# Patient Record
Sex: Female | Born: 1975 | Marital: Married | Smoking: Never smoker
Health system: Southern US, Community
[De-identification: ages and names within clinical notes are randomized; demographics above are authoritative.]

## PROBLEM LIST (undated history)

## (undated) DIAGNOSIS — C801 Malignant (primary) neoplasm, unspecified: Secondary | ICD-10-CM

## (undated) DIAGNOSIS — K529 Noninfective gastroenteritis and colitis, unspecified: Secondary | ICD-10-CM

## (undated) HISTORY — DX: Noninfective gastroenteritis and colitis, unspecified: K52.9

## (undated) HISTORY — PX: COLONOSCOPY: SHX174

## (undated) HISTORY — PX: WISDOM TOOTH EXTRACTION: SHX21

---

## 2017-07-16 ENCOUNTER — Other Ambulatory Visit: Payer: Self-pay | Admitting: Gastroenterology

## 2017-07-16 DIAGNOSIS — R1031 Right lower quadrant pain: Secondary | ICD-10-CM

## 2017-07-24 ENCOUNTER — Ambulatory Visit
Admission: RE | Admit: 2017-07-24 | Discharge: 2017-07-24 | Disposition: A | Discharge status: Home / Self Care | Payer: Self-pay | Source: Ambulatory Visit | Attending: Gastroenterology | Admitting: Gastroenterology

## 2017-07-24 DIAGNOSIS — R1031 Right lower quadrant pain: Secondary | ICD-10-CM

## 2017-07-24 MED ORDER — IOPAMIDOL (ISOVUE-300) INJECTION 61%
100.0000 mL | Freq: Once | INTRAVENOUS | Status: AC | PRN
  Administered 2017-07-24: 100 mL via INTRAVENOUS

## 2018-10-08 DIAGNOSIS — K5000 Crohn's disease of small intestine without complications: Secondary | ICD-10-CM | POA: Diagnosis not present

## 2018-10-08 DIAGNOSIS — R634 Abnormal weight loss: Secondary | ICD-10-CM | POA: Diagnosis not present

## 2018-10-27 DIAGNOSIS — K5000 Crohn's disease of small intestine without complications: Secondary | ICD-10-CM | POA: Diagnosis not present

## 2018-11-04 ENCOUNTER — Other Ambulatory Visit: Payer: Self-pay | Admitting: Gastroenterology

## 2018-11-04 DIAGNOSIS — R911 Solitary pulmonary nodule: Secondary | ICD-10-CM

## 2018-11-10 DIAGNOSIS — Z03818 Encounter for observation for suspected exposure to other biological agents ruled out: Secondary | ICD-10-CM | POA: Diagnosis not present

## 2018-11-23 DIAGNOSIS — Z03818 Encounter for observation for suspected exposure to other biological agents ruled out: Secondary | ICD-10-CM | POA: Diagnosis not present

## 2019-08-26 DIAGNOSIS — K5000 Crohn's disease of small intestine without complications: Secondary | ICD-10-CM | POA: Diagnosis not present

## 2019-10-21 DIAGNOSIS — K5000 Crohn's disease of small intestine without complications: Secondary | ICD-10-CM | POA: Diagnosis not present

## 2019-10-21 DIAGNOSIS — K635 Polyp of colon: Secondary | ICD-10-CM | POA: Diagnosis not present

## 2019-11-23 DIAGNOSIS — Z0000 Encounter for general adult medical examination without abnormal findings: Secondary | ICD-10-CM | POA: Diagnosis not present

## 2019-12-17 DIAGNOSIS — Z1231 Encounter for screening mammogram for malignant neoplasm of breast: Secondary | ICD-10-CM | POA: Diagnosis not present

## 2020-03-12 DIAGNOSIS — Z20822 Contact with and (suspected) exposure to covid-19: Secondary | ICD-10-CM | POA: Diagnosis not present

## 2021-04-02 DIAGNOSIS — D0512 Intraductal carcinoma in situ of left breast: Secondary | ICD-10-CM | POA: Diagnosis not present

## 2021-04-08 ENCOUNTER — Telehealth: Payer: Self-pay | Admitting: Radiation Oncology

## 2021-04-08 ENCOUNTER — Telehealth: Payer: Self-pay | Admitting: Hematology

## 2021-04-08 ENCOUNTER — Encounter: Payer: Self-pay | Admitting: Family Medicine

## 2021-04-08 ENCOUNTER — Other Ambulatory Visit: Payer: Self-pay | Admitting: *Deleted

## 2021-04-08 DIAGNOSIS — D0512 Intraductal carcinoma in situ of left breast: Secondary | ICD-10-CM | POA: Insufficient documentation

## 2021-04-08 NOTE — Telephone Encounter (Signed)
Scheduled appt per secure chat with RN Varney Biles. Called pt, no answer. Left msg for pt with appt date and time. Requested for pt to call back to confirm appt

## 2021-04-08 NOTE — Telephone Encounter (Signed)
Called patient to schedule consultation with Dr. Squire. No answer, LVM for return call. 

## 2021-04-08 NOTE — Telephone Encounter (Signed)
Called patient to schedule consultation with Dr. Isidore Moos. LVM with appt date & time. Asked patient to call back to confirm.

## 2021-04-08 NOTE — Progress Notes (Signed)
Radiation Oncology         [phone] ________________________________  Initial Outpatient Consultation  Name: Beth Stephens MRN: [id number]  Date: 04/09/2021  DOB: 01/07/1976  IP:[id number], No Pcp Per (Inactive)  Stark Klein, MD   REFERRING PHYSICIAN: Stark Klein, MD  DIAGNOSIS:    ICD-10-CM   1. Ductal carcinoma in situ (DCIS) of left breast  D05.12       Stage 0 (cTis (DCIS), cN0, cM0) Left Breast, Ductal carcinoma in-situ, ER+ / PR+ / Her2 not assessed, Grade 3  CHIEF COMPLAINT: Here to discuss management of left breast DCIS  HISTORY OF PRESENT ILLNESS::Beth Stephens is a 46 y.o. female who presented with breast abnormality on the following imaging: bilateral screening mammogram on the date of 01/04/21, which showed indeterminate grouped calcifications in the left breast measuring 0.3 cm. No symptoms, if any, were reported at that time.   Diagnostic left breast mammogram on 01/18/21 revealed the 0.3 cm round grouped calcifications in the left breast as suspicious.     Left breast biopsy on 04-02-21 showed ductal carcinoma in-situ.  ER status: 100% strongly positive; PR status 70% strongly positive, Her2 status not assessed; Grade intermediate, with necrosis.  She sees Dr Barry Dienes this week. She just met Dr Burr Medico and discussed antiestrogen therapy.   SAFETY ISSUES: Prior radiation? No Pacemaker/ICD? No Possible current pregnancy? No--LMP: ~04/05/2021 Is the patient on methotrexate? No  Current Complaints / other details:  Nothing else of note    Genetics consultation pending.  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  PREVIOUS RADIATION THERAPY: No  PAST MEDICAL HISTORY:  has a past medical history of Colitis.    PAST SURGICAL HISTORY:History reviewed. No pertinent surgical history.  FAMILY HISTORY: family history includes Cancer in an other family member; Heart disease in her father.  SOCIAL HISTORY:  reports that she has never  smoked. She does not have any smokeless tobacco history on file. She reports that she does not drink alcohol.  ALLERGIES: Sumatriptan  MEDICATIONS:  Current Outpatient Medications  Medication Sig Dispense Refill   sulfaDIAZINE 500 MG tablet Take 500 mg by mouth 2 (two) times daily.     No current facility-administered medications for this encounter.    REVIEW OF SYSTEMS: As above in HPI.   PHYSICAL EXAM:  vitals were not taken for this visit.   General: Alert and oriented, in no acute distress HEENT: Head is normocephalic. Extraocular movements are intact. Heart: Regular in rate and rhythm with no murmurs, rubs, or gallops. Chest: Clear to auscultation bilaterally, with no rhonchi, wheezes, or rales. Abdomen: Soft, nontender, nondistended, with no rigidity or guarding. Extremities: No cyanosis or edema. Lymphatics: see Neck Exam Skin: No concerning lesions. Musculoskeletal: symmetric strength and muscle tone throughout. Neurologic: Cranial nerves II through XII are grossly intact. No obvious focalities. Speech is fluent. Coordination is intact. Psychiatric: Judgment and insight are intact. Affect is appropriate. Breasts:  notable for post biopsy changes in left breast. No right breast masses, no masses in axillae b/l   ECOG = 0  0 - Asymptomatic (Fully active, able to carry on all predisease activities without restriction)  1 - Symptomatic but completely ambulatory (Restricted in physically strenuous activity but ambulatory and able to carry out work of a light or sedentary nature. For example, light housework, office work)  2 - Symptomatic, <50% in bed during the day (Ambulatory and capable of all self care but unable to carry out any  work activities. Up and about more than 50% of waking hours)  3 - Symptomatic, >50% in bed, but not bedbound (Capable of only limited self-care, confined to bed or chair 50% or more of waking hours)  4 - Bedbound (Completely disabled. Cannot  carry on any self-care. Totally confined to bed or chair)  5 - Death   Eustace Pen MM, Creech RH, Tormey DC, et al. [phone]). "Toxicity and response criteria of the [facility]". [place] Oncol. 5 (6): 649-55   LABORATORY DATA:  No results found for: WBC, HGB, HCT, MCV, PLT CMP  No results found for: NA, K, CL, CO2, GLUCOSE, BUN, CREATININE, CALCIUM, PROT, ALBUMIN, AST, ALT, ALKPHOS, BILITOT, GFRNONAA, GFRAA       RADIOGRAPHY:  as above    IMPRESSION/PLAN: This is a delightful 46 yo woman with ER+ DCIS, left breast   She appears to be good candidate for breast conservation. I talked to her about the option of a mastectomy and informed her that her expected overall survival would be equivalent between mastectomy and breast conservation, based upon randomized controlled data. She is enthusiastic about breast conservation. She sees Dr Barry Dienes soon.  It was a pleasure meeting the patient today. We discussed the risks, benefits, and side effects of adjuvant radiotherapy. I recommend radiotherapy to the left breast over 3-4 weeks to reduce her risk of locoregional recurrence by half.  We discussed  that I would give the patient a few weeks to heal following surgery before starting treatment planning.  We spoke about acute effects including skin irritation and fatigue as well as much less common late effects including internal organ injury or irritation. We spoke about the latest technology that is used to minimize the risk of late effects for patients undergoing radiotherapy to the breast or chest wall. No guarantees of treatment were given. The patient is enthusiastic about proceeding with treatment. I look forward to participating in the patient's care.  I will await her referral back to me for postoperative follow-up and eventual CT simulation/treatment planning.  On date of service, in total, I spent 45 minutes on this encounter. Patient was seen in person.    __________________________________________   Eppie Gibson, MD  This document serves as a record of services personally performed by Eppie Gibson, MD. It was created on her behalf by Roney Mans, a trained medical scribe. The creation of this record is based on the scribe's personal observations and the provider's statements to them. This document has been checked and approved by the attending provider.

## 2021-04-09 ENCOUNTER — Ambulatory Visit
Admission: RE | Admit: 2021-04-09 | Discharge: 2021-04-09 | Disposition: A | Discharge status: Home / Self Care | Payer: BC Managed Care – PPO | Source: Ambulatory Visit | Attending: Radiation Oncology | Admitting: Radiation Oncology

## 2021-04-09 ENCOUNTER — Other Ambulatory Visit: Payer: Self-pay

## 2021-04-09 ENCOUNTER — Encounter: Payer: Self-pay | Admitting: Hematology

## 2021-04-09 ENCOUNTER — Inpatient Hospital Stay: Payer: BC Managed Care – PPO | Attending: Hematology | Admitting: Hematology

## 2021-04-09 VITALS — BP 135/84 | HR 86 | Temp 98.0°F | Resp 17 | Wt 116.6 lb

## 2021-04-09 DIAGNOSIS — Z170 Estrogen receptor positive status [ER+]: Secondary | ICD-10-CM | POA: Insufficient documentation

## 2021-04-09 DIAGNOSIS — D0512 Intraductal carcinoma in situ of left breast: Secondary | ICD-10-CM | POA: Diagnosis not present

## 2021-04-09 DIAGNOSIS — Z803 Family history of malignant neoplasm of breast: Secondary | ICD-10-CM | POA: Diagnosis not present

## 2021-04-09 NOTE — Progress Notes (Signed)
Location of Breast Cancer:  Ductal carcinoma in situ (DCIS) of left breast  Past/Anticipated interventions by medical oncology, if any:  Had consultation with Dr. Truitt Merle earlier today PLAN:  -She will likely have lumpectomy soon -Genetic refer -I'll see her 2-3 weeks after her surgery to review her pathology result and finalize her antiestrogen therapy.  SAFETY ISSUES: Prior radiation? No Pacemaker/ICD? No Possible current pregnancy? No--LMP: ~04/05/2021 Is the patient on methotrexate? No  Current Complaints / other details:  Nothing else of note

## 2021-04-09 NOTE — Progress Notes (Signed)
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 Left breast DCIS, grade 2, ER+/PR+ -found on screening mammogram. Left diagnostic MM on 01/18/21 showed 0.3 cm calcifications. Biopsy on 04/02/21 confirmed intermediate grade DCIS. -I discussed her breast imaging and needle biopsy results with patient and her family members in great detail. -She is a candidate for breast conservation surgery. She will be seen by breast surgeon Dr. Barry Stephens on 04/12/21, who will likely recommend lumpectomy. -Given her young age, we recommend her to undergo genetic testing to ruled out inheritable breast cancer -Her DCIS will be cured by complete surgical resection. Any form of adjuvant therapy is preventive. -I reviewed her risk and treatment benefits using the Breast Cancer Nomogram from [facility]). Based on family, PMx and lifestyle she has a 17%%risk of developing future breast cancer in the next 10 years. Her risk would drop to 6-7% with RT or Antiestrogen therapy alone. With both adjuvant treatments her risk would decrease to 3%.  -Given her strongly ER and PR positivity of the tumor cells, and her pre-menopause status, I recommend antiestrogen therapy with tamoxifen, which decrease her risk of future breast cancer by ~40%.  --The potential side effects, which includes but not limited to, hot flash, skin and vaginal dryness, slightly increased risk of cardiovascular disease and cataract, small risk of thrombosis and endometrial cancer, were discussed with her in great details. Preventive strategies for thrombosis, such as being  physically active, using compression stocks, avoid cigarette smoking, etc., were reviewed with her. I also recommend her to follow-up with her gynecologist once a year, and watch for vaginal spotting or bleeding, as a clinically sign of endometrial cancer, etc. She voiced good understanding, and agrees to proceed. Will start after she completes adjuvant breast radiation. -She will likely benefit from breast radiation if she undergo lumpectomy to decrease the risk of breast cancer. -We also discussed that biopsy may have sampling limitation, we will review her surgical path, to see if she has any invasive carcinoma components. -We discussed breast cancer surveillance after she completes treatment, Including annual mammogram, breast exam every 6-12 months.   PLAN:  -She will see Dr. Barry Stephens later this week and likely have lumpectomy soon -Genetic refer (urgent)  -I'll see her 2-3 weeks after her surgery to review her pathology result and finalize her antiestrogen therapy.   -she is also interested in adjuvant radiation, but has some concerns about side effects, she will see Dr. Isidore Stephens today    Oncology History  Ductal carcinoma in situ (DCIS) of left breast  01/18/2021 Mammogram   Exam: 3D Mammogram Diagnostic Digital - L  IMPRESSION: The 0.3 cm grouped round calcifications in the left breast are suspicious.   04/02/2021 Initial Biopsy   Diagnosis 1. Breast, left, needle core biopsy, left breast calcification @ 3:00 6 cmfn  - DUCTAL CARCINOMA IN SITU WITH NECROSIS AND CALCIFICATIONS  - SEE COMMENT Microscopic Comment Immunohistochemistry was performed on the biopsy to assess for an invasive component (SMM, calponin, CD34 and p63). Myoepithelial cells are present supporting the diagnosis of ductal carcinoma in situ. Based on the biopsy, the ductal  carcinoma in situ cribriform and solid patterns, intermediate nuclear grade, and measures 0.6 cm in greatest linear extent.  1. 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 The patient was referred by [facility]. The patient presents to the clinic today alone.   She had routine screening mammography on 01/04/21 showing a possible abnormality in the left breast. She underwent left diagnostic mammography on 01/18/21 showing: 0.3 cm grouped calcifications in left breast at 3 o'clock.  Biopsy on 04/02/21 showed: DCIS, intermediate grade, with necrosis and calficiations. Prognostic indicators significant for: estrogen receptor, 100% positive and progesterone receptor, 70% positive.    Today the patient notes they felt/feeling prior/after...   She has a PMHx of....   Socially... 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 -she is married with a 64 year old son. -she reports lung cancer in a paternal uncle (non-smoker), no other family history of cancer to her knowledge.   GYN HISTORY  Menarchal: xx LMP: irregular Contraceptive: HRT: n/a GP: 1   REVIEW OF SYSTEMS:    Constitutional: Denies fevers, chills or abnormal night sweats Eyes: Denies blurriness of vision, double vision or watery eyes Ears, nose, mouth, throat, and face: Denies mucositis or sore throat Respiratory: Denies cough, dyspnea or wheezes Cardiovascular: Denies palpitation, chest discomfort or lower extremity swelling Gastrointestinal:  Denies nausea, heartburn  or change in bowel habits Skin: Denies abnormal skin rashes Lymphatics: Denies new lymphadenopathy or easy bruising Neurological:Denies numbness, tingling or new weaknesses Behavioral/Psych: Mood is stable, no new changes  All other systems were reviewed with the patient and are negative.   MEDICAL HISTORY:  Past Medical History:  Diagnosis Date   Colitis     SURGICAL HISTORY: History reviewed. No pertinent surgical history.  SOCIAL HISTORY: Social History   Socioeconomic History   Marital status: Married    Spouse name: Not on file   Number of children: 1   Years of education: Not on file   Highest education level: Not on file  Occupational History   Not on file  Tobacco Use   Smoking status: Never   Smokeless tobacco: Not on file  Substance and Sexual Activity   Alcohol use: Never   Drug use: Not on file   Sexual activity: Not on file  Other Topics Concern   Not on file  Social History Narrative   Not on file   Social Determinants of Health   Financial Resource Strain: Not on file  Food Insecurity: Not on file  Transportation Needs: Not on file  Physical Activity: Not on file  Stress: Not on file  Social Connections: Not on file  Intimate Partner Violence: Not on file    FAMILY HISTORY: Family History  Problem Relation Age of Onset   Heart disease Father    Cancer Other        lung cancer    ALLERGIES:  is allergic to sumatriptan.  MEDICATIONS:  Current Outpatient  Medications  Medication Sig Dispense Refill   sulfaDIAZINE 500 MG tablet Take 500 mg by mouth 2 (two) times daily.     No current facility-administered medications for this visit.    PHYSICAL EXAMINATION: ECOG PERFORMANCE STATUS: 0 - Asymptomatic  Vitals:   04/09/21 1430  BP: 135/84  Pulse: 86  Resp: 17  Temp: 98 F (36.7 C)  SpO2: 99%   Filed Weights   04/09/21 1430  Weight: 116 lb 9.6 oz (52.9 kg)    GENERAL:alert, no distress and comfortable SKIN: skin color normal,  no rashes or significant lesions EYES: normal, Conjunctiva are pink and non-injected, sclera clear  NEURO: alert & oriented x 3 with fluent speech BREAST exam deferred today   LABORATORY DATA:  I have reviewed the data as listed No flowsheet data found.  No flowsheet data found.   RADIOGRAPHIC STUDIES: I have personally reviewed the radiological images as listed and agreed with the findings in the report. No results found.   No orders of the defined types were placed in this encounter.   All questions were answered. The patient knows to call the clinic with any problems, questions or concerns. The total time spent in the appointment was 40 minutes.     Truitt Merle, MD 04/09/2021  I, Wilburn Mylar, am acting as scribe for Truitt Merle, MD.   I have reviewed the above documentation for accuracy and completeness, and I agree with the above.

## 2021-04-10 ENCOUNTER — Other Ambulatory Visit: Payer: Self-pay | Admitting: Radiation Oncology

## 2021-04-10 ENCOUNTER — Encounter: Payer: Self-pay | Admitting: Radiation Oncology

## 2021-04-10 ENCOUNTER — Telehealth: Payer: Self-pay | Admitting: Genetic Counselor

## 2021-04-10 ENCOUNTER — Inpatient Hospital Stay
Admission: RE | Admit: 2021-04-10 | Discharge: 2021-04-10 | Disposition: A | Discharge status: Home / Self Care | Payer: Self-pay | Source: Ambulatory Visit | Attending: Radiation Oncology | Admitting: Radiation Oncology

## 2021-04-10 ENCOUNTER — Telehealth: Payer: Self-pay | Admitting: Hematology

## 2021-04-10 ENCOUNTER — Encounter: Payer: Self-pay | Admitting: Hematology

## 2021-04-10 DIAGNOSIS — D0512 Intraductal carcinoma in situ of left breast: Secondary | ICD-10-CM

## 2021-04-10 NOTE — Telephone Encounter (Signed)
Called to schedule urgent genetics visit per Dr. Ernestina Penna request.  LVM requesting call back.

## 2021-04-10 NOTE — Telephone Encounter (Signed)
Left message with follow-up appointment per 1/11 secure chat.

## 2021-04-11 ENCOUNTER — Telehealth: Payer: Self-pay | Admitting: *Deleted

## 2021-04-11 ENCOUNTER — Encounter: Payer: Self-pay | Admitting: Genetic Counselor

## 2021-04-11 ENCOUNTER — Inpatient Hospital Stay: Payer: BC Managed Care – PPO

## 2021-04-11 ENCOUNTER — Other Ambulatory Visit: Payer: Self-pay

## 2021-04-11 ENCOUNTER — Inpatient Hospital Stay (HOSPITAL_BASED_OUTPATIENT_CLINIC_OR_DEPARTMENT_OTHER): Payer: BC Managed Care – PPO | Admitting: Genetic Counselor

## 2021-04-11 ENCOUNTER — Encounter: Payer: Self-pay | Admitting: *Deleted

## 2021-04-11 DIAGNOSIS — D0512 Intraductal carcinoma in situ of left breast: Secondary | ICD-10-CM | POA: Diagnosis not present

## 2021-04-11 DIAGNOSIS — Z170 Estrogen receptor positive status [ER+]: Secondary | ICD-10-CM | POA: Diagnosis not present

## 2021-04-11 LAB — CBC WITH DIFFERENTIAL/PLATELET
Abs Immature Granulocytes: 0.04 10*3/uL (ref 0.00–0.07)
Basophils Absolute: 0 10*3/uL (ref 0.0–0.1)
Basophils Relative: 0 %
Eosinophils Absolute: 0 10*3/uL (ref 0.0–0.5)
Eosinophils Relative: 0 %
HCT: 39.3 % (ref 36.0–46.0)
Hemoglobin: 12.3 g/dL (ref 12.0–15.0)
Immature Granulocytes: 0 %
Lymphocytes Relative: 14 %
Lymphs Abs: 1.7 10*3/uL (ref 0.7–4.0)
MCH: 25.3 pg — ABNORMAL LOW (ref 26.0–34.0)
MCHC: 31.3 g/dL (ref 30.0–36.0)
MCV: 80.7 fL (ref 80.0–100.0)
Monocytes Absolute: 1.2 10*3/uL — ABNORMAL HIGH (ref 0.1–1.0)
Monocytes Relative: 10 %
Neutro Abs: 9.3 10*3/uL — ABNORMAL HIGH (ref 1.7–7.7)
Neutrophils Relative %: 76 %
Platelets: 213 10*3/uL (ref 150–400)
RBC: 4.87 MIL/uL (ref 3.87–5.11)
RDW: 14.1 % (ref 11.5–15.5)
WBC: 12.4 10*3/uL — ABNORMAL HIGH (ref 4.0–10.5)
nRBC: 0 % (ref 0.0–0.2)

## 2021-04-11 LAB — PROTIME-INR
INR: 1 (ref 0.8–1.2)
Prothrombin Time: 13.6 seconds (ref 11.4–15.2)

## 2021-04-11 LAB — COMPREHENSIVE METABOLIC PANEL
ALT: 13 U/L (ref 0–44)
AST: 16 U/L (ref 15–41)
Albumin: 4.3 g/dL (ref 3.5–5.0)
Alkaline Phosphatase: 56 U/L (ref 38–126)
Anion gap: 7 (ref 5–15)
BUN: 17 mg/dL (ref 6–20)
CO2: 25 mmol/L (ref 22–32)
Calcium: 9.1 mg/dL (ref 8.9–10.3)
Chloride: 108 mmol/L (ref 98–111)
Creatinine, Ser: 0.65 mg/dL (ref 0.44–1.00)
GFR, Estimated: >60 mL/min (ref >60)
Glucose, Bld: 105 mg/dL — ABNORMAL HIGH (ref 70–99)
Potassium: 4 mmol/L (ref 3.5–5.1)
Sodium: 140 mmol/L (ref 135–145)
Total Bilirubin: 0.4 mg/dL (ref 0.3–1.2)
Total Protein: 7.6 g/dL (ref 6.5–8.1)

## 2021-04-11 LAB — GENETIC SCREENING ORDER

## 2021-04-11 NOTE — Addendum Note (Signed)
Addended by: Truitt Merle on: 04/11/2021 04:17 PM   Modules accepted: Orders

## 2021-04-11 NOTE — Progress Notes (Signed)
REFERRING PROVIDER: Truitt Merle, MD [place],  [place] [address]  PRIMARY PROVIDER:  Patient, No Pcp Per (Inactive)  PRIMARY REASON FOR VISIT:  1. Ductal carcinoma in situ (DCIS) of left breast      HISTORY OF PRESENT ILLNESS:   Ms. Tench, a 46 y.o. female, was seen for a [facility] cancer genetics consultation at the request of Dr. Burr Medico due to a personal history of breast cancer.  Ms. Raybourn presents to clinic today to discuss the possibility of a hereditary predisposition to cancer, genetic testing, and to further clarify her future cancer risks, as well as potential cancer risks for family members.   In January 2023, at the age of 37, Ms. Elpers was diagnosed with DCIS of the left breast. The treatment plan will depend on the genetic testing.     CANCER HISTORY:  Oncology History  Ductal carcinoma in situ (DCIS) of left breast  01/18/2021 Mammogram   Exam: 3D Mammogram Diagnostic Digital - L  IMPRESSION: The 0.3 cm grouped round calcifications in the left breast are suspicious.   04/02/2021 Initial Biopsy   Diagnosis 1. Breast, left, needle core biopsy, left breast calcification @ 3:00 6 cmfn  - DUCTAL CARCINOMA IN SITU WITH NECROSIS AND CALCIFICATIONS  - SEE COMMENT Microscopic Comment Immunohistochemistry was performed on the biopsy to assess for an invasive component (SMM, calponin, CD34 and p63). Myoepithelial cells are present supporting the diagnosis of ductal carcinoma in situ. Based on the biopsy, the ductal carcinoma in situ cribriform and solid patterns, intermediate nuclear grade, and measures 0.6 cm in greatest linear extent.  1. PROGNOSTIC INDICATORS Results: Estrogen Receptor: 100%, POSITIVE, STRONG STAINING INTENSITY Progesterone Receptor: 70%, POSITIVE, STRONG STAINING INTENSITY   04/02/2021 Cancer Staging   Staging form: Breast, AJCC 8th Edition - Clinical stage from 04/02/2021: Stage 0 (cTis (DCIS), cN0, cM0, G3, ER+, PR+, HER2: Not  Assessed) - Signed by Truitt Merle, MD on 04/09/2021 Stage prefix: Initial diagnosis Histologic grading system: 3 grade system    04/08/2021 Initial Diagnosis   Ductal carcinoma in situ (DCIS) of left breast      RISK FACTORS:  Menarche was at age 51-14.  First live birth at age N/A.  OCP use for approximately 0 years.  Ovaries intact: yes.  Hysterectomy: no.  Menopausal status: premenopausal.  HRT use: 0 years. Colonoscopy: yes;  pt has colitis. She has had 1-2 polyps . Mammogram within the last year: yes. Number of breast biopsies: 1. Up to date with pelvic exams: yes. Any excessive radiation exposure in the past: no  Past Medical History:  Diagnosis Date   Colitis     No past surgical history on file.  Social History   Socioeconomic History   Marital status: Married    Spouse name: Not on file   Number of children: 1   Years of education: Not on file   Highest education level: Not on file  Occupational History   Not on file  Tobacco Use   Smoking status: Never   Smokeless tobacco: Not on file  Substance and Sexual Activity   Alcohol use: Never   Drug use: Not on file   Sexual activity: Not on file  Other Topics Concern   Not on file  Social History Narrative   Not on file   Social Determinants of Health   Financial Resource Strain: Not on file  Food Insecurity: Not on file  Transportation Needs: Not on file  Physical Activity: Not on file  Stress: Not on file  Social Connections: Not on file     FAMILY HISTORY:  We obtained a detailed, 4-generation family history.  Significant diagnoses are listed below: Family History  Problem Relation Age of Onset   Heart disease Father    Lung cancer Paternal Uncle        non-smoker   Cancer Other        lung cancer    The patient has one son who is cancer free.  Of note he is biologically hers, but was conceived via surrogate.  She has one brother who is cancer free.  Both parents are living and cancer  free.  The patient's father has five brothers and four sisters.  One brother has lung cancer and was a non-smoker.  There is no other reported family history of cancer.  The patient's mother is 25 and has not had cancer.  There is no other reported family history of cancer on the maternal side.  Ms. Forinash is unaware of previous family history of genetic testing for hereditary cancer risks. Patient's maternal ancestors are of [place] descent, and paternal ancestors are of [place] descent. There is no reported Ashkenazi Jewish ancestry. There is no known consanguinity.  GENETIC COUNSELING ASSESSMENT: Ms. Sommers is a 46 y.o. female with a family history of breast cancer which is somewhat suggestive of a hereditary cancer syndrome and predisposition to cancer given her young age of onset. We, therefore, discussed and recommended the following at today's visit.   DISCUSSION: We discussed that, in general, most cancer is not inherited in families, but instead is sporadic or familial. Sporadic cancers occur by chance and typically happen at older ages (>50 years) as this type of cancer is caused by genetic changes acquired during an individuals lifetime. Some families have more cancers than would be expected by chance; however, the ages or types of cancer are not consistent with a known genetic mutation or known genetic mutations have been ruled out. This type of familial cancer is thought to be due to a combination of multiple genetic, environmental, hormonal, and lifestyle factors. While this combination of factors likely increases the risk of cancer, the exact source of this risk is not currently identifiable or testable.  We discussed that 5 - 10% of breast cancer is hereditary, with most cases associated with BRCA mutations.  There are other genes that can be associated with hereditary breast cancer syndromes.  These include ATM, CHEK2 and PALB2.  We discussed that testing is beneficial  for several reasons including knowing how to follow individuals after completing their treatment, identifying whether potential treatment options such as PARP inhibitors would be beneficial, and understand if other family members could be at risk for cancer and allow them to undergo genetic testing.   We reviewed the characteristics, features and inheritance patterns of hereditary cancer syndromes. We discussed the implications of a negative, positive and/or variant of uncertain significant result. In order to get genetic test results in a timely manner so that Ms. Mccants can use these genetic test results for surgical decisions, we recommended Ms. Surratt pursue genetic testing for the BRCAPlus. Once complete, we recommend Ms. Lupo pursue reflex genetic testing to the CancerNext-Expanded+RNAinsight gene panel.   The CancerNext-Expanded gene panel offered by [facility] and includes sequencing and rearrangement analysis for the following 77 genes: AIP, ALK, APC*, ATM*, AXIN2, BAP1, BARD1, BLM, BMPR1A, BRCA1*, BRCA2*, BRIP1*, CDC73, CDH1*, CDK4, CDKN1B, CDKN2A, CHEK2*, CTNNA1, DICER1, FANCC, FH, FLCN, GALNT12,  KIF1B, LZTR1, MAX, MEN1, MET, MLH1*, MSH2*, MSH3, MSH6*, MUTYH*, NBN, NF1*, NF2, NTHL1, PALB2*, PHOX2B, PMS2*, POT1, PRKAR1A, PTCH1, PTEN*, RAD51C*, RAD51D*, RB1, RECQL, RET, SDHA, SDHAF2, SDHB, SDHC, SDHD, SMAD4, SMARCA4, SMARCB1, SMARCE1, STK11, SUFU, TMEM127, TP53*, TSC1, TSC2, VHL and XRCC2 (sequencing and deletion/duplication); EGFR, EGLN1, HOXB13, KIT, MITF, PDGFRA, POLD1, and POLE (sequencing only); EPCAM and GREM1 (deletion/duplication only). DNA and RNA analyses performed for * genes.   Based on Ms. Meroney's family history of cancer, she meets medical criteria for genetic testing. Despite that she meets criteria, she may still have an out of pocket cost. We discussed that if her out of pocket cost for testing is over $100, the laboratory will call and confirm whether she wants to  proceed with testing.  If the out of pocket cost of testing is less than $100 she will be billed by the genetic testing laboratory.   PLAN: After considering the risks, benefits, and limitations, Ms. Anfinson provided informed consent to pursue genetic testing and the blood sample was sent to [facility] for analysis of the CancerNext-Expanded+RNAinsight. Results should be available within approximately 5-10 days, with the remainder of testing taking a total of 2-3 weeks' time, at which point they will be disclosed by telephone to Ms. Otte, as will any additional recommendations warranted by these results. Ms. Dragone will receive a summary of her genetic counseling visit and a copy of her results once available. This information will also be available in Epic.   Lastly, we encouraged Ms. Colclasure to remain in contact with cancer genetics annually so that we can continuously update the family history and inform her of any changes in cancer genetics and testing that may be of benefit for this family.   Ms. Azure's questions were answered to her satisfaction today. Our contact information was provided should additional questions or concerns arise. Thank you for the referral and allowing [place] to share in the care of your patient.   Judas Mohammad P. Florene Glen, [place], [facility] Licensed, Insurance risk surveyor [email or web address] phone: [phone]  The patient was seen for a total of 30 minutes in face-to-face genetic counseling.  The patient was seen alone.  This patient was discussed with Drs. Magrinat, Lindi Adie and/or Burr Medico who agrees with the above.    _______________________________________________________________________ For Office Staff:  Number of people involved in session: 1 Was an Intern/ student involved with case: no

## 2021-04-11 NOTE — Telephone Encounter (Signed)
Called pt, left vm with navigation resources and contact information. Request return call with questions or needs regarding dx or treatment care plan

## 2021-04-12 ENCOUNTER — Other Ambulatory Visit: Payer: Self-pay | Admitting: General Surgery

## 2021-04-12 DIAGNOSIS — C50412 Malignant neoplasm of upper-outer quadrant of left female breast: Secondary | ICD-10-CM | POA: Diagnosis not present

## 2021-04-12 DIAGNOSIS — Z170 Estrogen receptor positive status [ER+]: Secondary | ICD-10-CM | POA: Diagnosis not present

## 2021-04-15 ENCOUNTER — Encounter: Payer: Self-pay | Admitting: *Deleted

## 2021-04-15 ENCOUNTER — Telehealth: Payer: Self-pay | Admitting: Radiation Oncology

## 2021-04-15 ENCOUNTER — Telehealth: Payer: Self-pay

## 2021-04-15 DIAGNOSIS — D0512 Intraductal carcinoma in situ of left breast: Secondary | ICD-10-CM

## 2021-04-15 NOTE — Telephone Encounter (Signed)
Called patient to schedule FUN with Dr. Isidore Moos with CT SIM to follow. No answer, LVM for return call.

## 2021-04-15 NOTE — Telephone Encounter (Signed)
-----   Message from Truitt Merle, MD sent at 04/14/2021  9:04 PM EST ----- Please let pt know her lab results, CBC and CMP are WNL, except mildly elevated WBC with dominant neutrophil, likely reactive, no concerns, thanks   Truitt Merle  04/14/2021

## 2021-04-15 NOTE — Telephone Encounter (Signed)
Pt was called and told per Dr. Burr Medico, her CBC and CMP was within normal limits, and her WBC was mildly elevated but there are no concerns at this time. Pt stated that is great and asked this LPN to send her a MyChart code so she could get that setup (sent) and to ask Dr. Burr Medico if a TSH was done at her last visit. This LPN told pt she did not have one drawn last visit. Pt stated she was just wondering because she has a procedure coming up elsewhere and a TSH might be required. Pt was made aware if the facility needs a TSH done before any type of procedure that it will be done beforehand. Pt agrees and verbalizes understanding. Pt was told to call back with any further concerns.

## 2021-04-16 ENCOUNTER — Telehealth: Payer: Self-pay | Admitting: Radiation Oncology

## 2021-04-16 NOTE — Telephone Encounter (Signed)
Second attempt: Called patient to schedule FUN with Dr. Isidore Moos with CT SIM to follow. No answer, LVM with appointment date and time, asked that she call back to confirm.

## 2021-04-19 ENCOUNTER — Other Ambulatory Visit: Payer: Self-pay | Admitting: General Surgery

## 2021-04-19 ENCOUNTER — Telehealth: Payer: Self-pay | Admitting: Genetic Counselor

## 2021-04-19 DIAGNOSIS — Z170 Estrogen receptor positive status [ER+]: Secondary | ICD-10-CM

## 2021-04-19 DIAGNOSIS — C50412 Malignant neoplasm of upper-outer quadrant of left female breast: Secondary | ICD-10-CM

## 2021-04-19 NOTE — Telephone Encounter (Signed)
LM on VM that results are back and to please call. 

## 2021-04-22 ENCOUNTER — Encounter: Payer: Self-pay | Admitting: Genetic Counselor

## 2021-04-22 ENCOUNTER — Telehealth: Payer: Self-pay | Admitting: Genetic Counselor

## 2021-04-22 DIAGNOSIS — Z1379 Encounter for other screening for genetic and chromosomal anomalies: Secondary | ICD-10-CM | POA: Insufficient documentation

## 2021-04-22 NOTE — Telephone Encounter (Signed)
I attempted to contact [facility] to discuss her genetic testing results. Her voicemail box was full. We will attempt to contact her again at a later date.   Lucille Passy, MS, [facility] Inpatient Genetic Counselor [email or web address] (P) [phone]

## 2021-04-22 NOTE — Progress Notes (Addendum)
Surgical Instructions    Your procedure is scheduled on Wednesday 05/01/21.   Report to [facility] Main Entrance "A" at 12:30 P.M., then check in with the Admitting office.  Call this number if you have problems the morning of surgery:  [phone]   If you have any questions prior to your surgery date call [phone]: Open Monday-Friday 8am-4pm    Remember:  Do not eat after midnight the night before your surgery  You may drink clear liquids until 11:30 A.M. the morning of your surgery.   Clear liquids allowed are: Water, Non-Citrus Juices (without pulp), Carbonated Beverages, Clear Tea, Black Coffee ONLY (NO MILK, CREAM OR POWDERED CREAMER of any kind), and Gatorade    Take these medicines the morning of surgery with A SIP OF WATER   NONE   As of today, STOP taking any Aspirin (unless otherwise instructed by your surgeon) Aleve, Naproxen, Ibuprofen, Motrin, Advil, Goody's, BC's, all herbal medications, fish oil, and all vitamins.  After your COVID test   You are not required to quarantine however you are required to wear a well-fitting mask when you are out and around people not in your household.  If your mask becomes wet or soiled, replace with a new one.  Wash your hands often with soap and water for 20 seconds or clean your hands with an alcohol-based hand sanitizer that contains at least 60% alcohol.  Do not share personal items.  Notify your provider: if you are in close contact with someone who has COVID  or if you develop a fever of 100.4 or greater, sneezing, cough, sore throat, shortness of breath or body aches.           Do not wear jewelry or makeup Do not wear lotions, powders, perfumes/colognes, or deodorant. Do not shave 48 hours prior to surgery.  Men may shave face and neck. Do not bring valuables to the hospital. DO Not wear nail polish, gel polish, artificial nails, or any other type of covering on natural nails (fingers and toes) If you have artificial  nails or gel coating that need to be removed by a nail salon, please have this removed prior to surgery. Artificial nails or gel coating may interfere with anesthesia's ability to adequately monitor your vital signs.             [facility] is not responsible for any belongings or valuables.  Do NOT Smoke (Tobacco/Vaping)  24 hours prior to your procedure  If you use a CPAP at night, you may bring your mask for your overnight stay.   Contacts, glasses, hearing aids, dentures or partials may not be worn into surgery, please bring cases for these belongings   For patients admitted to the hospital, discharge time will be determined by your treatment team.   Patients discharged the day of surgery will not be allowed to drive home, and someone needs to stay with them for 24 hours.  NO VISITORS WILL BE ALLOWED IN PRE-OP WHERE PATIENTS ARE PREPPED FOR SURGERY.  ONLY 1 SUPPORT PERSON MAY BE PRESENT IN THE WAITING ROOM WHILE YOU ARE IN SURGERY.  IF YOU ARE TO BE ADMITTED, ONCE YOU ARE IN YOUR ROOM YOU WILL BE ALLOWED TWO (2) VISITORS. 1 (ONE) VISITOR MAY STAY OVERNIGHT BUT MUST ARRIVE TO THE ROOM BY 8pm.  Minor children may have two parents present. Special consideration for safety and communication needs will be reviewed on a case by case basis.  Special instructions:  Oral Hygiene is also important to reduce your risk of infection.  Remember - BRUSH YOUR TEETH THE MORNING OF SURGERY WITH YOUR REGULAR TOOTHPASTE   [facility]- Preparing For Surgery  Before surgery, you can play an important role. Because skin is not sterile, your skin needs to be as free of germs as possible. You can reduce the number of germs on your skin by washing with CHG (chlorahexidine gluconate) Soap before surgery.  CHG is an antiseptic cleaner which kills germs and bonds with the skin to continue killing germs even after washing.     Please do not use if you have an allergy to CHG or antibacterial soaps. If your skin  becomes reddened/irritated stop using the CHG.  Do not shave (including legs and underarms) for at least 48 hours prior to first CHG shower. It is OK to shave your face.  Please follow these instructions carefully.     Shower the NIGHT BEFORE SURGERY and the MORNING OF SURGERY with CHG Soap.   If you chose to wash your hair, wash your hair first as usual with your normal shampoo. After you shampoo, rinse your hair and body thoroughly to remove the shampoo.  Then [employer] and genitals (private parts) with your normal soap and rinse thoroughly to remove soap.  After that Use CHG Soap as you would any other liquid soap. You can apply CHG directly to the skin and wash gently with a scrungie or a clean washcloth.   Apply the CHG Soap to your body ONLY FROM THE NECK DOWN.  Do not use on open wounds or open sores. Avoid contact with your eyes, ears, mouth and genitals (private parts). Wash Face and genitals (private parts)  with your normal soap.   Wash thoroughly, paying special attention to the area where your surgery will be performed.  Thoroughly rinse your body with warm water from the neck down.  DO NOT shower/wash with your normal soap after using and rinsing off the CHG Soap.  Pat yourself dry with a CLEAN TOWEL.  Wear CLEAN PAJAMAS to bed the night before surgery  Place CLEAN SHEETS on your bed the night before your surgery  DO NOT SLEEP WITH PETS.   Day of Surgery:  Take a shower with CHG soap. Wear Clean/Comfortable clothing the morning of surgery Do not apply any deodorants/lotions.   Remember to brush your teeth WITH YOUR REGULAR TOOTHPASTE.   Please read over the following fact sheets that you were given.

## 2021-04-22 NOTE — Telephone Encounter (Signed)
I contacted [facility] to discuss her genetic testing results. No pathogenic variants were identified in the 77 genes analyzed. Detailed clinic note to follow.  The test report has been scanned into EPIC and is located under the Molecular Pathology section of the Results Review tab.  A portion of the result report is included below for reference.   Beth Passy, [facility], [facility] Genetic Counselor [email or web address] (P) [phone]

## 2021-04-23 ENCOUNTER — Other Ambulatory Visit: Payer: Self-pay

## 2021-04-23 ENCOUNTER — Ambulatory Visit: Payer: Self-pay | Admitting: Genetic Counselor

## 2021-04-23 ENCOUNTER — Encounter (HOSPITAL_COMMUNITY)
Admission: RE | Admit: 2021-04-23 | Discharge: 2021-04-23 | Disposition: A | Discharge status: Home / Self Care | Payer: BC Managed Care – PPO | Source: Ambulatory Visit | Attending: General Surgery | Admitting: General Surgery

## 2021-04-23 ENCOUNTER — Encounter (HOSPITAL_COMMUNITY): Payer: Self-pay

## 2021-04-23 DIAGNOSIS — K529 Noninfective gastroenteritis and colitis, unspecified: Secondary | ICD-10-CM | POA: Insufficient documentation

## 2021-04-23 DIAGNOSIS — C50912 Malignant neoplasm of unspecified site of left female breast: Secondary | ICD-10-CM | POA: Insufficient documentation

## 2021-04-23 DIAGNOSIS — Z01812 Encounter for preprocedural laboratory examination: Secondary | ICD-10-CM | POA: Insufficient documentation

## 2021-04-23 DIAGNOSIS — Z1379 Encounter for other screening for genetic and chromosomal anomalies: Secondary | ICD-10-CM

## 2021-04-23 HISTORY — DX: Malignant (primary) neoplasm, unspecified: C80.1

## 2021-04-23 LAB — POCT PREGNANCY, URINE: Preg Test, Ur: NEGATIVE

## 2021-04-23 NOTE — Progress Notes (Signed)
HPI:  Ms. Willis was previously seen in the [place] clinic due to a personal history of breast cancer and concerns regarding a hereditary predisposition to cancer. Please refer to our prior cancer genetics clinic note for more information regarding our discussion, assessment and recommendations, at the time. Ms. Rooks's recent genetic test results were disclosed to her, as were recommendations warranted by these results. These results and recommendations are discussed in more detail below.  CANCER HISTORY:  Oncology History  Ductal carcinoma in situ (DCIS) of left breast  01/18/2021 Mammogram   Exam: 3D Mammogram Diagnostic Digital - L  IMPRESSION: The 0.3 cm grouped round calcifications in the left breast are suspicious.   04/02/2021 Initial Biopsy   Diagnosis 1. Breast, left, needle core biopsy, left breast calcification @ 3:00 6 cmfn  - DUCTAL CARCINOMA IN SITU WITH NECROSIS AND CALCIFICATIONS  - SEE COMMENT Microscopic Comment Immunohistochemistry was performed on the biopsy to assess for an invasive component (SMM, calponin, CD34 and p63). Myoepithelial cells are present supporting the diagnosis of ductal carcinoma in situ. Based on the biopsy, the ductal carcinoma in situ cribriform and solid patterns, intermediate nuclear grade, and measures 0.6 cm in greatest linear extent.  1. PROGNOSTIC INDICATORS Results: Estrogen Receptor: 100%, POSITIVE, STRONG STAINING INTENSITY Progesterone Receptor: 70%, POSITIVE, STRONG STAINING INTENSITY   04/02/2021 Cancer Staging   Staging form: Breast, AJCC 8th Edition - Clinical stage from 04/02/2021: Stage 0 (cTis (DCIS), cN0, cM0, G3, ER+, PR+, HER2: Not Assessed) - Signed by Truitt Merle, MD on 04/09/2021 Stage prefix: Initial diagnosis Histologic grading system: 3 grade system    04/08/2021 Initial Diagnosis   Ductal carcinoma in situ (DCIS) of left breast    Genetic Testing   [facility] CancerNext-Expanded Panel was Negative.  Report date is 04/20/2021.  The CancerNext-Expanded gene panel offered by [facility] and includes sequencing, rearrangement, and RNA analysis for the following 77 genes: AIP, ALK, APC, ATM, AXIN2, BAP1, BARD1, BLM, BMPR1A, BRCA1, BRCA2, BRIP1, CDC73, CDH1, CDK4, CDKN1B, CDKN2A, CHEK2, CTNNA1, DICER1, FANCC, FH, FLCN, GALNT12, KIF1B, LZTR1, MAX, MEN1, MET, MLH1, MSH2, MSH3, MSH6, MUTYH, NBN, NF1, NF2, NTHL1, PALB2, PHOX2B, PMS2, POT1, PRKAR1A, PTCH1, PTEN, RAD51C, RAD51D, RB1, RECQL, RET, SDHA, SDHAF2, SDHB, SDHC, SDHD, SMAD4, SMARCA4, SMARCB1, SMARCE1, STK11, SUFU, TMEM127, TP53, TSC1, TSC2, VHL and XRCC2 (sequencing and deletion/duplication); EGFR, EGLN1, HOXB13, KIT, MITF, PDGFRA, POLD1, and POLE (sequencing only); EPCAM and GREM1 (deletion/duplication only).      FAMILY HISTORY:  We obtained a detailed, 4-generation family history.  Significant diagnoses are listed below: Family History  Problem Relation Age of Onset   Heart disease Father    Lung cancer Paternal Uncle        non-smoker   Cancer Other        lung cancer     The patient has one son who is cancer free.  Of note he is biologically hers, but was conceived via surrogate.  She has one brother who is cancer free.  Both parents are living and cancer free.   The patient's father has five brothers and four sisters.  One brother has lung cancer and was a non-smoker.  There is no other reported family history of cancer.   The patient's mother is 52 and has not had cancer.  There is no other reported family history of cancer on the maternal side.   Ms. Schaumburg is unaware of previous family history of genetic testing for hereditary cancer risks. Patient's maternal ancestors are of  [place] descent, and paternal ancestors are of [place] descent. There is no reported Ashkenazi Jewish ancestry. There is no known consanguinity.  GENETIC TEST RESULTS: Genetic testing reported out on April 20, 2021 through the  CancerNext-Expanded+RNAinsight cancer panel found no pathogenic mutations. The CancerNext-Expanded gene panel offered by [facility] and includes sequencing and rearrangement analysis for the following 77 genes: AIP, ALK, APC*, ATM*, AXIN2, BAP1, BARD1, BLM, BMPR1A, BRCA1*, BRCA2*, BRIP1*, CDC73, CDH1*, CDK4, CDKN1B, CDKN2A, CHEK2*, CTNNA1, DICER1, FANCC, FH, FLCN, GALNT12, KIF1B, LZTR1, MAX, MEN1, MET, MLH1*, MSH2*, MSH3, MSH6*, MUTYH*, NBN, NF1*, NF2, NTHL1, PALB2*, PHOX2B, PMS2*, POT1, PRKAR1A, PTCH1, PTEN*, RAD51C*, RAD51D*, RB1, RECQL, RET, SDHA, SDHAF2, SDHB, SDHC, SDHD, SMAD4, SMARCA4, SMARCB1, SMARCE1, STK11, SUFU, TMEM127, TP53*, TSC1, TSC2, VHL and XRCC2 (sequencing and deletion/duplication); EGFR, EGLN1, HOXB13, KIT, MITF, PDGFRA, POLD1, and POLE (sequencing only); EPCAM and GREM1 (deletion/duplication only). DNA and RNA analyses performed for * genes. The test report has been scanned into EPIC and is located under the Molecular Pathology section of the Results Review tab.  A portion of the result report is included below for reference.     We discussed with Ms. Beckmann that because current genetic testing is not perfect, it is possible there may be a gene mutation in one of these genes that current testing cannot detect, but that chance is small.  We also discussed, that there could be another gene that has not yet been discovered, or that we have not yet tested, that is responsible for the cancer diagnoses in the family. It is also possible there is a hereditary cause for the cancer in the family that Ms. Staggs did not inherit and therefore was not identified in her testing.  Therefore, it is important to remain in touch with cancer genetics in the future so that we can continue to offer Ms. Roussin the most up to date genetic testing.   ADDITIONAL GENETIC TESTING: We discussed with Ms. Sleeth that her genetic testing was fairly extensive.  If there are genes identified to increase  cancer risk that can be analyzed in the future, we would be happy to discuss and coordinate this testing at that time.    CANCER SCREENING RECOMMENDATIONS: Ms. Hayne's test result is considered negative (normal).  This means that we have not identified a hereditary cause for her personal history of breast cancer at this time. Most cancers happen by chance and this negative test suggests that her cancer may fall into this category.    While reassuring, this does not definitively rule out a hereditary predisposition to cancer. It is still possible that there could be genetic mutations that are undetectable by current technology. There could be genetic mutations in genes that have not been tested or identified to increase cancer risk.  Therefore, it is recommended she continue to follow the cancer management and screening guidelines provided by her oncology and primary healthcare provider.   An individual's cancer risk and medical management are not determined by genetic test results alone. Overall cancer risk assessment incorporates additional factors, including personal medical history, family history, and any available genetic information that may result in a personalized plan for cancer prevention and surveillance  RECOMMENDATIONS FOR FAMILY MEMBERS:  Individuals in this family might be at some increased risk of developing cancer, over the general population risk, simply due to the family history of cancer.  We recommended women in this family have a yearly mammogram beginning at age 55, or 40 years younger than the  earliest onset of cancer, an annual clinical breast exam, and perform monthly breast self-exams. Women in this family should also have a gynecological exam as recommended by their primary provider. All family members should be referred for colonoscopy starting at age 34.  FOLLOW-UP: Lastly, we discussed with Ms. Warbington that cancer genetics is a rapidly advancing field and it is possible  that new genetic tests will be appropriate for her and/or her family members in the future. We encouraged her to remain in contact with cancer genetics on an annual basis so we can update her personal and family histories and let her know of advances in cancer genetics that may benefit this family.   Our contact number was provided. Ms. Graves's questions were answered to her satisfaction, and she knows she is welcome to call us at anytime with additional questions or concerns.   Roma Kayser, [place], [facility] Licensed, Certified Genetic Counselor [email or web address]

## 2021-04-23 NOTE — Progress Notes (Signed)
PCP -  Cardiologist - denies  PPM/ICD - n/a Device Orders - n/a Rep Notified - n/a  Chest x-ray - n/a EKG - n/a Stress Test - denies ECHO - denies Cardiac Cath - denies  Sleep Study - denies CPAP - n/a  Fasting Blood Sugar - n/a Checks Blood Sugar _____ times a day- n/a  Blood Thinner Instructions: n/a Aspirin Instructions: n/a  ERAS Protcol - Yes PRE-SURGERY Ensure or G2- None ordered.  COVID TEST- Ambulatory Surgery   Anesthesia review: Yes. Seed Lumpectomy. Urine pregnancy test negative at PAT appointment.   Patient denies shortness of breath, fever, cough and chest pain at PAT appointment   All instructions explained to the patient, with a verbal understanding of the material. Patient agrees to go over the instructions while at home for a better understanding. Patient also instructed to self quarantine after being tested for COVID-19. The opportunity to ask questions was provided.

## 2021-04-24 NOTE — Progress Notes (Signed)
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  History includes never smoker, colitis, breast cancer (left breast biopsy 04/02/21: DCIS).  RSL 04/29/21 at 2:30 PM. Urine pregnancy test was negative on 04/24/21.   Anesthesia team to evaluate on the day of surgery.   VS: BP 127/77    Pulse 87    Temp 36.9 C (Oral)    Resp 17    Ht 5' 3.5" (1.613 m)    Wt 52.2 kg    LMP 04/09/2021 (Exact Date)    SpO2 100%    BMI 20.05 kg/m   PROVIDERS: Pcp, No Truitt Merle, MD is HEM-ONC Eppie Gibson, MD is RAD-ONC  LABS: Labs results on 04/11/21 included: Lab Results  Component Value Date   WBC 12.4 (H) 04/11/2021   HGB 12.3 04/11/2021   HCT 39.3 04/11/2021   PLT 213 04/11/2021   GLUCOSE 105 (H) 04/11/2021   ALT 13 04/11/2021   AST 16 04/11/2021   NA 140 04/11/2021   K 4.0 04/11/2021   CL 108 04/11/2021   CREATININE 0.65 04/11/2021   BUN 17 04/11/2021   CO2 25 04/11/2021   INR 1.0 04/11/2021  Urine pregnancy test negative on 04/23/21.   EKG: N/A   CV: N/A  Past Medical History:  Diagnosis Date   Cancer ([place])    Breast Cancer   Colitis     Past Surgical History:  Procedure Laterality Date   COLONOSCOPY     WISDOM TOOTH EXTRACTION      MEDICATIONS:  sulfaSALAzine (AZULFIDINE) 500 MG tablet   No current facility-administered medications for this encounter.    Myra Gianotti, PA-C Surgical Short Stay/Anesthesiology [facility] Phone [phone] [facility] Phone [phone] 04/24/2021 5:13 PM

## 2021-04-24 NOTE — Anesthesia Preprocedure Evaluation (Addendum)
Anesthesia Evaluation  Patient identified by MRN, date of birth, ID band Patient awake    Reviewed: Allergy & Precautions, NPO status , Patient's Chart, lab work & pertinent test results  Airway Mallampati: I  TM Distance: >3 FB Neck ROM: Full    Dental no notable dental hx.    Pulmonary neg pulmonary ROS,    Pulmonary exam normal breath sounds clear to auscultation       Cardiovascular negative cardio ROS Normal cardiovascular exam Rhythm:Regular Rate:Normal     Neuro/Psych negative neurological ROS  negative psych ROS   GI/Hepatic Neg liver ROS, colitis   Endo/Other  negative endocrine ROS  Renal/GU negative Renal ROS  negative genitourinary   Musculoskeletal negative musculoskeletal ROS (+)   Abdominal   Peds negative pediatric ROS (+)  Hematology negative hematology ROS (+)   Anesthesia Other Findings Breast cancer  Reproductive/Obstetrics negative OB ROS                            Anesthesia Physical Anesthesia Plan  ASA: 2  Anesthesia Plan: General   Post-op Pain Management: Tylenol PO (pre-op) and Minimal or no pain anticipated   Induction: Intravenous  PONV Risk Score and Plan: 3 and Treatment may vary due to age or medical condition, Ondansetron, Dexamethasone and Midazolam  Airway Management Planned: LMA  Additional Equipment: None  Intra-op Plan:   Post-operative Plan: Extubation in OR  Informed Consent: I have reviewed the patients History and Physical, chart, labs and discussed the procedure including the risks, benefits and alternatives for the proposed anesthesia with the patient or authorized representative who has indicated his/her understanding and acceptance.     Dental advisory given  Plan Discussed with: CRNA, Anesthesiologist and Surgeon  Anesthesia Plan Comments: (PAT note written 04/24/2021 by Myra Gianotti, PA-C. )       Anesthesia  Quick Evaluation

## 2021-04-26 ENCOUNTER — Other Ambulatory Visit: Payer: Self-pay

## 2021-04-26 ENCOUNTER — Ambulatory Visit
Admission: RE | Admit: 2021-04-26 | Discharge: 2021-04-26 | Disposition: A | Discharge status: Home / Self Care | Payer: BC Managed Care – PPO | Source: Ambulatory Visit | Attending: General Surgery | Admitting: General Surgery

## 2021-04-26 DIAGNOSIS — C50412 Malignant neoplasm of upper-outer quadrant of left female breast: Secondary | ICD-10-CM

## 2021-04-26 DIAGNOSIS — N6489 Other specified disorders of breast: Secondary | ICD-10-CM | POA: Diagnosis not present

## 2021-04-26 DIAGNOSIS — Z170 Estrogen receptor positive status [ER+]: Secondary | ICD-10-CM

## 2021-04-26 MED ORDER — GADOBUTROL 1 MMOL/ML IV SOLN
5.0000 mL | Freq: Once | INTRAVENOUS | Status: AC | PRN
  Administered 2021-04-26: 5 mL via INTRAVENOUS

## 2021-04-29 DIAGNOSIS — D0512 Intraductal carcinoma in situ of left breast: Secondary | ICD-10-CM | POA: Diagnosis not present

## 2021-04-30 DIAGNOSIS — Z853 Personal history of malignant neoplasm of breast: Secondary | ICD-10-CM | POA: Diagnosis not present

## 2021-04-30 DIAGNOSIS — N6324 Unspecified lump in the left breast, lower inner quadrant: Secondary | ICD-10-CM | POA: Diagnosis not present

## 2021-05-01 ENCOUNTER — Ambulatory Visit (HOSPITAL_COMMUNITY): Payer: BC Managed Care – PPO | Admitting: Anesthesiology

## 2021-05-01 ENCOUNTER — Other Ambulatory Visit: Payer: Self-pay

## 2021-05-01 ENCOUNTER — Encounter (HOSPITAL_COMMUNITY): Payer: Self-pay | Admitting: General Surgery

## 2021-05-01 ENCOUNTER — Encounter (HOSPITAL_COMMUNITY)
Admission: RE | Disposition: A | Discharge status: Home / Self Care | Payer: Self-pay | Source: Home / Self Care | Attending: General Surgery

## 2021-05-01 ENCOUNTER — Ambulatory Visit (HOSPITAL_COMMUNITY)
Admission: RE | Admit: 2021-05-01 | Discharge: 2021-05-01 | Disposition: A | Discharge status: Home / Self Care | Payer: BC Managed Care – PPO | Attending: General Surgery | Admitting: General Surgery

## 2021-05-01 ENCOUNTER — Ambulatory Visit (HOSPITAL_COMMUNITY): Payer: BC Managed Care – PPO | Admitting: Vascular Surgery

## 2021-05-01 DIAGNOSIS — C50412 Malignant neoplasm of upper-outer quadrant of left female breast: Secondary | ICD-10-CM | POA: Diagnosis not present

## 2021-05-01 DIAGNOSIS — R921 Mammographic calcification found on diagnostic imaging of breast: Secondary | ICD-10-CM | POA: Diagnosis not present

## 2021-05-01 DIAGNOSIS — C50912 Malignant neoplasm of unspecified site of left female breast: Secondary | ICD-10-CM | POA: Diagnosis not present

## 2021-05-01 DIAGNOSIS — N6022 Fibroadenosis of left breast: Secondary | ICD-10-CM | POA: Diagnosis not present

## 2021-05-01 DIAGNOSIS — D0512 Intraductal carcinoma in situ of left breast: Secondary | ICD-10-CM | POA: Insufficient documentation

## 2021-05-01 DIAGNOSIS — N6012 Diffuse cystic mastopathy of left breast: Secondary | ICD-10-CM | POA: Diagnosis not present

## 2021-05-01 HISTORY — PX: BREAST LUMPECTOMY WITH RADIOACTIVE SEED LOCALIZATION: SHX6424

## 2021-05-01 SURGERY — BREAST LUMPECTOMY WITH RADIOACTIVE SEED LOCALIZATION
Anesthesia: General | Site: Breast | Laterality: Left

## 2021-05-01 MED ORDER — DEXAMETHASONE SODIUM PHOSPHATE 10 MG/ML IJ SOLN
INTRAMUSCULAR | Status: DC | PRN
Start: 2021-05-01 — End: 2021-05-01
  Administered 2021-05-01: 10 mg via INTRAVENOUS

## 2021-05-01 MED ORDER — PHENYLEPHRINE HCL-NACL 20-0.9 MG/250ML-% IV SOLN
INTRAVENOUS | Status: DC | PRN
  Administered 2021-05-01: 20 ug/min via INTRAVENOUS

## 2021-05-01 MED ORDER — ONDANSETRON HCL 4 MG/2ML IJ SOLN
INTRAMUSCULAR | Status: AC
  Filled 2021-05-01: qty 2

## 2021-05-01 MED ORDER — LACTATED RINGERS IV SOLN: INTRAVENOUS | Status: DC

## 2021-05-01 MED ORDER — LIDOCAINE HCL 1 % IJ SOLN
INTRAMUSCULAR | Status: DC | PRN
  Administered 2021-05-01: 40 mL

## 2021-05-01 MED ORDER — ACETAMINOPHEN 500 MG PO TABS
ORAL_TABLET | ORAL | Status: AC
  Administered 2021-05-01: 1000 mg via ORAL
  Filled 2021-05-01: qty 2

## 2021-05-01 MED ORDER — FENTANYL CITRATE (PF) 100 MCG/2ML IJ SOLN
25.0000 ug | INTRAMUSCULAR | Status: DC | PRN
  Administered 2021-05-01: 25 ug via INTRAVENOUS

## 2021-05-01 MED ORDER — AMISULPRIDE (ANTIEMETIC) 5 MG/2ML IV SOLN: 10.0000 mg | Freq: Once | INTRAVENOUS | Status: DC | PRN

## 2021-05-01 MED ORDER — CHLORHEXIDINE GLUCONATE 0.12 % MT SOLN
OROMUCOSAL | Status: AC
  Administered 2021-05-01: 15 mL via OROMUCOSAL
  Filled 2021-05-01: qty 15

## 2021-05-01 MED ORDER — FENTANYL CITRATE (PF) 250 MCG/5ML IJ SOLN
INTRAMUSCULAR | Status: DC | PRN
  Administered 2021-05-01 (×2): 25 ug via INTRAVENOUS

## 2021-05-01 MED ORDER — MIDAZOLAM HCL 2 MG/2ML IJ SOLN
INTRAMUSCULAR | Status: DC | PRN
Start: 2021-05-01 — End: 2021-05-01
  Administered 2021-05-01: 2 mg via INTRAVENOUS

## 2021-05-01 MED ORDER — CHLORHEXIDINE GLUCONATE CLOTH 2 % EX PADS: 6.0000 | MEDICATED_PAD | Freq: Once | CUTANEOUS | Status: DC

## 2021-05-01 MED ORDER — MIDAZOLAM HCL 2 MG/2ML IJ SOLN
INTRAMUSCULAR | Status: AC
  Filled 2021-05-01: qty 2

## 2021-05-01 MED ORDER — 0.9 % SODIUM CHLORIDE (POUR BTL) OPTIME
TOPICAL | Status: DC | PRN
  Administered 2021-05-01: 1000 mL

## 2021-05-01 MED ORDER — PROPOFOL 10 MG/ML IV BOLUS
INTRAVENOUS | Status: DC | PRN
  Administered 2021-05-01: 50 mg via INTRAVENOUS
  Administered 2021-05-01: 150 mg via INTRAVENOUS

## 2021-05-01 MED ORDER — MEPERIDINE HCL 25 MG/ML IJ SOLN
INTRAMUSCULAR | Status: AC
  Filled 2021-05-01: qty 1

## 2021-05-01 MED ORDER — CEFAZOLIN SODIUM-DEXTROSE 2-4 GM/100ML-% IV SOLN
INTRAVENOUS | Status: AC
  Filled 2021-05-01: qty 100

## 2021-05-01 MED ORDER — LIDOCAINE HCL 1 % IJ SOLN
INTRAMUSCULAR | Status: AC
  Filled 2021-05-01: qty 20

## 2021-05-01 MED ORDER — ORAL CARE MOUTH RINSE: 15.0000 mL | Freq: Once | OROMUCOSAL | Status: AC

## 2021-05-01 MED ORDER — FENTANYL CITRATE (PF) 100 MCG/2ML IJ SOLN
INTRAMUSCULAR | Status: AC
  Filled 2021-05-01: qty 2

## 2021-05-01 MED ORDER — FENTANYL CITRATE (PF) 250 MCG/5ML IJ SOLN
INTRAMUSCULAR | Status: AC
  Filled 2021-05-01: qty 5

## 2021-05-01 MED ORDER — ACETAMINOPHEN 500 MG PO TABS: 1000.0000 mg | ORAL_TABLET | ORAL | Status: AC

## 2021-05-01 MED ORDER — LIDOCAINE 2% (20 MG/ML) 5 ML SYRINGE
INTRAMUSCULAR | Status: DC | PRN
  Administered 2021-05-01: 80 mg via INTRAVENOUS

## 2021-05-01 MED ORDER — DEXAMETHASONE SODIUM PHOSPHATE 10 MG/ML IJ SOLN
INTRAMUSCULAR | Status: AC
  Filled 2021-05-01: qty 1

## 2021-05-01 MED ORDER — OXYCODONE HCL 5 MG PO TABS: 5.0000 mg | ORAL_TABLET | Freq: Once | ORAL | Status: DC | PRN

## 2021-05-01 MED ORDER — OXYCODONE HCL 5 MG/5ML PO SOLN: 5.0000 mg | Freq: Once | ORAL | Status: DC | PRN

## 2021-05-01 MED ORDER — LIDOCAINE 2% (20 MG/ML) 5 ML SYRINGE
INTRAMUSCULAR | Status: AC
  Filled 2021-05-01: qty 5

## 2021-05-01 MED ORDER — ONDANSETRON HCL 4 MG/2ML IJ SOLN
INTRAMUSCULAR | Status: DC | PRN
Start: 2021-05-01 — End: 2021-05-01
  Administered 2021-05-01: 40 mg via INTRAVENOUS

## 2021-05-01 MED ORDER — PHENYLEPHRINE 40 MCG/ML (10ML) SYRINGE FOR IV PUSH (FOR BLOOD PRESSURE SUPPORT)
PREFILLED_SYRINGE | INTRAVENOUS | Status: DC | PRN
  Administered 2021-05-01: 80 ug via INTRAVENOUS

## 2021-05-01 MED ORDER — SCOPOLAMINE 1 MG/3DAYS TD PT72
1.0000 | MEDICATED_PATCH | TRANSDERMAL | Status: DC
  Administered 2021-05-01: 1.5 mg via TRANSDERMAL
  Filled 2021-05-01: qty 1

## 2021-05-01 MED ORDER — CEFAZOLIN SODIUM-DEXTROSE 2-4 GM/100ML-% IV SOLN
2.0000 g | INTRAVENOUS | Status: AC
  Administered 2021-05-01: 2 g via INTRAVENOUS

## 2021-05-01 MED ORDER — OXYCODONE HCL 5 MG PO TABS: 5.0000 mg | ORAL_TABLET | Freq: Four times a day (QID) | ORAL | 0 refills | Status: DC | PRN

## 2021-05-01 MED ORDER — CHLORHEXIDINE GLUCONATE 0.12 % MT SOLN: 15.0000 mL | Freq: Once | OROMUCOSAL | Status: AC

## 2021-05-01 MED ORDER — PROMETHAZINE HCL 25 MG/ML IJ SOLN: 6.2500 mg | INTRAMUSCULAR | Status: DC | PRN

## 2021-05-01 MED ORDER — MEPERIDINE HCL 25 MG/ML IJ SOLN
6.2500 mg | INTRAMUSCULAR | Status: DC | PRN
  Administered 2021-05-01: 6.25 mg via INTRAVENOUS

## 2021-05-01 MED ORDER — PROPOFOL 500 MG/50ML IV EMUL
INTRAVENOUS | Status: DC | PRN
  Administered 2021-05-01: 125 ug/kg/min via INTRAVENOUS

## 2021-05-01 MED ORDER — PROPOFOL 10 MG/ML IV BOLUS
INTRAVENOUS | Status: AC
  Filled 2021-05-01: qty 20

## 2021-05-01 MED ORDER — BUPIVACAINE-EPINEPHRINE (PF) 0.25% -1:200000 IJ SOLN
INTRAMUSCULAR | Status: AC
  Filled 2021-05-01: qty 30

## 2021-05-01 SURGICAL SUPPLY — 42 items
BAG COUNTER SPONGE SURGICOUNT (BAG) ×2 IMPLANT
BINDER BREAST LRG (GAUZE/BANDAGES/DRESSINGS) ×1 IMPLANT
BINDER BREAST XLRG (GAUZE/BANDAGES/DRESSINGS) IMPLANT
BLADE SURG 10 STRL SS (BLADE) ×1 IMPLANT
CANISTER SUCT 3000ML PPV (MISCELLANEOUS) IMPLANT
CHLORAPREP W/TINT 26 (MISCELLANEOUS) ×2 IMPLANT
CLIP VESOCCLUDE LG 6/CT (CLIP) ×2 IMPLANT
COVER PROBE W GEL 5X96 (DRAPES) ×2 IMPLANT
COVER SURGICAL LIGHT HANDLE (MISCELLANEOUS) ×2 IMPLANT
DERMABOND ADVANCED (GAUZE/BANDAGES/DRESSINGS) ×1
DERMABOND ADVANCED .7 DNX12 (GAUZE/BANDAGES/DRESSINGS) ×1 IMPLANT
DEVICE DUBIN SPECIMEN MAMMOGRA (MISCELLANEOUS) ×3 IMPLANT
DRAPE CHEST BREAST 15X10 FENES (DRAPES) ×2 IMPLANT
DRSG PAD ABDOMINAL 8X10 ST (GAUZE/BANDAGES/DRESSINGS) ×1 IMPLANT
ELECT COATED BLADE 2.86 ST (ELECTRODE) ×2 IMPLANT
ELECT REM PT RETURN 9FT ADLT (ELECTROSURGICAL) ×2
ELECTRODE REM PT RTRN 9FT ADLT (ELECTROSURGICAL) ×1 IMPLANT
GAUZE SPONGE 4X4 12PLY STRL (GAUZE/BANDAGES/DRESSINGS) ×1 IMPLANT
GAUZE SPONGE 4X4 12PLY STRL LF (GAUZE/BANDAGES/DRESSINGS) ×1 IMPLANT
GLOVE SURG ENC MOIS LTX SZ6 (GLOVE) ×2 IMPLANT
GLOVE SURG UNDER LTX SZ6.5 (GLOVE) ×1 IMPLANT
GOWN STRL REUS W/ TWL LRG LVL3 (GOWN DISPOSABLE) ×1 IMPLANT
GOWN STRL REUS W/TWL 2XL LVL3 (GOWN DISPOSABLE) ×2 IMPLANT
GOWN STRL REUS W/TWL LRG LVL3 (GOWN DISPOSABLE) ×2
KIT BASIN OR (CUSTOM PROCEDURE TRAY) ×2 IMPLANT
KIT MARKER MARGIN INK (KITS) ×2 IMPLANT
LIGHT WAVEGUIDE WIDE FLAT (MISCELLANEOUS) ×1 IMPLANT
NDL HYPO 25GX1X1/2 BEV (NEEDLE) ×1 IMPLANT
NEEDLE HYPO 25GX1X1/2 BEV (NEEDLE) ×2 IMPLANT
NS IRRIG 1000ML POUR BTL (IV SOLUTION) ×1 IMPLANT
PACK GENERAL/GYN (CUSTOM PROCEDURE TRAY) ×2 IMPLANT
PAD ABD 8X10 STRL (GAUZE/BANDAGES/DRESSINGS) ×1 IMPLANT
STRIP CLOSURE SKIN 1/2X4 (GAUZE/BANDAGES/DRESSINGS) ×2 IMPLANT
SUT MNCRL AB 4-0 PS2 18 (SUTURE) ×2 IMPLANT
SUT SILK 2 0 SH (SUTURE) IMPLANT
SUT VIC AB 2-0 SH 27 (SUTURE) ×1
SUT VIC AB 2-0 SH 27XBRD (SUTURE) ×1 IMPLANT
SUT VIC AB 3-0 SH 27 (SUTURE) ×1
SUT VIC AB 3-0 SH 27X BRD (SUTURE) ×1 IMPLANT
SYR CONTROL 10ML LL (SYRINGE) ×2 IMPLANT
TOWEL GREEN STERILE (TOWEL DISPOSABLE) ×2 IMPLANT
TOWEL GREEN STERILE FF (TOWEL DISPOSABLE) ×2 IMPLANT

## 2021-05-01 NOTE — Op Note (Signed)
Left Breast Radioactive seed localized lumpectomy and left breast seed localized excisional biopsy  Indications: This patient presents with history of left breast cancer, upper outer quadrant, intermediate grade DCIS, strongly ER/PR +, abnormal left breast MRI  Pre-operative Diagnosis: left breast cancer, abnormal left breast MRI  Post-operative Diagnosis: Same  Surgeon: Stark Klein   Anesthesia: General endotracheal anesthesia  ASA Class: 2  Procedure Details  The patient was seen in the Holding Room. The risks, benefits, complications, treatment options, and expected outcomes were discussed with the patient. The possibilities of bleeding, infection, the need for additional procedures, failure to diagnose a condition, and creating a complication requiring other procedures or operations were discussed with the patient. The patient concurred with the proposed plan, giving informed consent.  The site of surgery properly noted/marked. The patient was taken to Operating Room # 2, identified, and the procedure verified as left breast seed localized lumpectomy and left breast seed localized excisional biopsy  The left breast and chest were prepped and draped in standard fashion. The excisional biopsy was addressed first. An inferomedial circumareolar incision was made near the previously placed radioactive seed at 7:30 o'clock.  Dissection was carried down around the point of maximum signal intensity. The cautery was used to perform the dissection.   The specimen was inked with the margin marker paint kit. Specimen radiography confirmed inclusion of the seed.  The cancer was very lateral.  A small lateral incision was made near this seed.  The cautery was used to dissect the tissue around the seed.  Once this was complete, the tissue was marked with the paint kit.  Specimen radiography confirmed the presence of the lesion, the clip and the seed.    The background signal in the breast was zero.   Hemostasis was achieved with cautery.  The cavity of the cancer excision was marked with clips on each border other than the anterior border.  The wound was irrigated and closed with 3-0 vicryl interrupted deep dermal sutures and 4-0 monocryl running subcuticular suture.      Sterile dressings were applied. At the end of the operation, all sponge, instrument, and needle counts were correct.   Findings: Seed in specimen #1; Seed, clip in specimen #2.  Anterior margin is skin and posterior margin is pectoralis for both excisions.     Estimated Blood Loss:  min         Specimens:  Left breast tissue with seed 7:30 excisional biopsy.  2.   Left breast lumpectomy with seed at 3 o'clock          Complications:  None; patient tolerated the procedure well.         Disposition: PACU - hemodynamically stable.         Condition: stable

## 2021-05-01 NOTE — Anesthesia Procedure Notes (Signed)
Procedure Name: LMA Insertion Date/Time: 05/01/2021 4:06 PM Performed by: Erick Colace, CRNA Pre-anesthesia Checklist: Patient identified, Emergency Drugs available, Suction available and Patient being monitored Patient Re-evaluated:Patient Re-evaluated prior to induction Oxygen Delivery Method: Circle system utilized Preoxygenation: Pre-oxygenation with 100% oxygen Induction Type: IV induction Ventilation: Mask ventilation without difficulty LMA: LMA inserted LMA Size: 4.0 Laser Tube: Cuffed inflated with minimal occlusive pressure - saline Placement Confirmation: positive ETCO2 and breath sounds checked- equal and bilateral Tube secured with: Tape Dental Injury: Teeth and Oropharynx as per pre-operative assessment

## 2021-05-01 NOTE — Transfer of Care (Signed)
Immediate Anesthesia Transfer of Care Note  Patient: Beth Stephens  Procedure(s) Performed: LEFT BREAST LUMPECTOMY WITH RADIOACTIVE SEED LOCALIZATION (Left: Breast)  Patient Location: PACU  Anesthesia Type:General  Level of Consciousness: awake, oriented and patient cooperative  Airway & Oxygen Therapy: Patient Spontanous Breathing and Patient connected to nasal cannula oxygen  Post-op Assessment: Report given to RN, Post -op Vital signs reviewed and stable and Patient moving all extremities  Post vital signs: Reviewed and stable  Last Vitals:  Vitals Value Taken Time  BP 80/62 05/01/21 1751  Temp    Pulse 92 05/01/21 1751  Resp 20 05/01/21 1751  SpO2 100 % 05/01/21 1751  Vitals shown include unvalidated device data.  Last Pain:  Vitals:   05/01/21 1309  PainSc: 0-No pain         Complications: No notable events documented.

## 2021-05-01 NOTE — Addendum Note (Signed)
Addendum  created 05/01/21 1825 by Nolon Nations, MD   Order list changed, Pharmacy for encounter modified

## 2021-05-01 NOTE — Discharge Instructions (Addendum)
[facility] Office Phone Number [phone]  BREAST BIOPSY/ PARTIAL MASTECTOMY: POST OP INSTRUCTIONS  Always review your discharge instruction sheet given to you by the facility where your surgery was performed.  IF YOU HAVE DISABILITY OR FAMILY LEAVE FORMS, YOU MUST BRING THEM TO THE OFFICE FOR PROCESSING.  DO NOT GIVE THEM TO YOUR DOCTOR.  A prescription for pain medication may be given to you upon discharge.  Take your pain medication as prescribed, if needed.  If narcotic pain medicine is not needed, then you may take acetaminophen (Tylenol) or ibuprofen (Advil) as needed. Take your usually prescribed medications unless otherwise directed If you need a refill on your pain medication, please contact your pharmacy.  They will contact our office to request authorization.  Prescriptions will not be filled after 5pm or on week-ends. You should eat very light the first 24 hours after surgery, such as soup, crackers, pudding, etc.  Resume your normal diet the day after surgery. Most patients will experience some swelling and bruising in the breast.  Ice packs and a good support bra will help.  Swelling and bruising can take several days to resolve.  It is common to experience some constipation if taking pain medication after surgery.  Increasing fluid intake and taking a stool softener will usually help or prevent this problem from occurring.  A mild laxative (Milk of Magnesia or Miralax) should be taken according to package directions if there are no bowel movements after 48 hours. Unless discharge instructions indicate otherwise, you may remove your bandages 48 hours after surgery, and you may shower at that time.  You may have steri-strips (small skin tapes) in place directly over the incision.  These strips should be left on the skin for 7-10 days.   Any sutures or staples will be removed at the office during your follow-up visit. ACTIVITIES:  You may resume regular daily activities  (gradually increasing) beginning the next day.  Wearing a good support bra or sports bra (or the breast binder) minimizes pain and swelling.  You may have sexual intercourse when it is comfortable. You may drive when you no longer are taking prescription pain medication, you can comfortably wear a seatbelt, and you can safely maneuver your car and apply brakes. RETURN TO WORK:  __________1 week_______________ You should see your doctor in the office for a follow-up appointment approximately two weeks after your surgery.  Your doctor's nurse will typically make your follow-up appointment when she calls you with your pathology report.  Expect your pathology report 2-3 business days after your surgery.  You may call to check if you do not hear from us after three days.   WHEN TO CALL YOUR DOCTOR: Fever over 101.0 Nausea and/or vomiting. Extreme swelling or bruising. Continued bleeding from incision. Increased pain, redness, or drainage from the incision.  The clinic staff is available to answer your questions during regular business hours.  Please don't hesitate to call and ask to speak to one of the nurses for clinical concerns.  If you have a medical emergency, go to the nearest emergency room or call 911.  A surgeon from [facility] is always on call at the hospital.  For further questions, please visit [email or web address]   

## 2021-05-01 NOTE — Anesthesia Postprocedure Evaluation (Signed)
Anesthesia Post Note  Patient: Ayushi Neff  Procedure(s) Performed: LEFT BREAST LUMPECTOMY WITH RADIOACTIVE SEED LOCALIZATION (Left: Breast)     Patient location during evaluation: PACU Anesthesia Type: General Level of consciousness: awake and alert, oriented and patient cooperative Pain management: pain level controlled Vital Signs Assessment: post-procedure vital signs reviewed and stable Respiratory status: spontaneous breathing, nonlabored ventilation and respiratory function stable Cardiovascular status: blood pressure returned to baseline and stable Postop Assessment: no apparent nausea or vomiting Anesthetic complications: no   No notable events documented.  Last Vitals:  Vitals:   05/01/21 1254  BP: (!) 121/59  Pulse: 90  Resp: 18  Temp: 36.7 C  SpO2: 98%    Last Pain:  Vitals:   05/01/21 1309  PainSc: 0-No pain                 Pervis Hocking

## 2021-05-01 NOTE — Interval H&P Note (Signed)
History and Physical Interval Note:  05/01/2021 1:26 PM  Beth Stephens  has presented today for surgery, with the diagnosis of LEFT BREAST CANCER.  The various methods of treatment have been discussed with the patient and family. After consideration of risks, benefits and other options for treatment, the patient has consented to  Procedure(s): LEFT BREAST LUMPECTOMY WITH RADIOACTIVE SEED LOCALIZATION (Left) as a surgical intervention. Pt also had an abnormal MRI and another seed was placed to excise.   The patient's history has been reviewed, patient examined, no change in status, stable for surgery.  I have reviewed the patient's chart and labs.  Questions were answered to the patient's satisfaction.     Stark Klein

## 2021-05-01 NOTE — H&P (Signed)
REFERRING PHYSICIAN: Luan Pulling  PROVIDER: Georgianne Fick, MD  Care Team: Patient Care Team: None as PCP - General Barry Dienes Ballard Russell, MD as Consulting Provider (Surgical Oncology) Truitt Merle, MD (Hematology and Oncology) Acquanetta Belling, MD (Radiation Oncology) Allyson Sabal, MD (Radiology)   MRN: [id number] DOB: 1975-08-28 DATE OF ENCOUNTER: 04/12/2021  Subjective   Chief Complaint: Breast Cancer   History of Present Illness: Beth Stephens is a 46 y.o. female who is seen today as an office consultation at the request of Dr. Luan Pulling for evaluation of Breast Cancer .  Pt is a 46 yo F who presents with a new diagnosis of left breast cancer 03/2021. She had screening detected left breast calcifications. Diagnostic imaging confirmed this. It showed 3 mm of grouped calcifications in a C density breast. These were in the upper outer quadrant. Biopsy showed intermediate grade DCIS, receptors +/+ strong intensity.  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 She has a 20 yo son. Her husband is a nocturnist for medicine.   She had no personal or family cancer history before this.   Diagnostic mammogram: [facility] 01/18/21  Exam: 3D Mammogram Diagnostic Digital - L   IMPRESSION: The 0.3 cm grouped round calcifications in the left breast are suspicious.   Pathology core needle biopsy: 04/02/20 1. Breast, left, needle core biopsy, left breast calcification @ 3:00 6 cmfn - DUCTAL CARCINOMA IN SITU WITH NECROSIS AND CALCIFICATIONS Myoepithelial cells are present supporting the diagnosis of ductal carcinoma in situ. Based on the biopsy, the ductal carcinoma in situ cribriform and solid patterns, intermediate nuclear grade, and measures 0.6 cm in greatest linear extent  Receptors: Estrogen Receptor: 100%, POSITIVE, STRONG STAINING INTENSITY Progesterone Receptor: 70%, POSITIVE, STRONG STAINING INTENSITY  Review of Systems: A complete review of systems  was obtained from the patient. I have reviewed this information and discussed as appropriate with the patient. See HPI as well for other ROS.  Review of Systems  All other systems reviewed and are negative.   Medical History: Past Medical History:  Diagnosis Date   History of cancer   Thyroid disease   Patient Active Problem List  Diagnosis   Malignant neoplasm of upper-outer quadrant of left breast in female, estrogen receptor positive (CMS-HCC)   History reviewed. No pertinent surgical history.   No Known Allergies  Current Outpatient Medications on File Prior to Visit  Medication Sig Dispense Refill   sulfADIAZINE 500 mg tablet Take 500 mg by mouth 2 (two) times daily   No current facility-administered medications on file prior to visit.   Family History  Problem Relation Age of Onset   Diabetes Mother   Coronary Artery Disease (Blocked arteries around heart) Father    Social History   Tobacco Use  Smoking Status Never  Smokeless Tobacco Never    Social History   Socioeconomic History   Marital status: Unknown  Tobacco Use   Smoking status: Never   Smokeless tobacco: Never  Substance and Sexual Activity   Alcohol use: Never   Drug use: Never   Objective:   Vitals:  04/12/21 1027  BP: 110/70  Pulse: 104  Temp: 36.4 C (97.6 F)  SpO2: 96%  Weight: 53.1 kg (117 lb)  Height: 161.3 cm (5' 3.5")   Body mass index is 20.4 kg/m.  Gen: No acute distress. Well nourished and well groomed.  Neurological: Alert and oriented to person, place, and time. Coordination normal.  Head: Normocephalic and atraumatic.  Eyes: Conjunctivae  are normal. Pupils are equal, round, and reactive to light. No scleral icterus.  Neck: Normal range of motion. Neck supple. No tracheal deviation or thyromegaly present.  Cardiovascular: Normal rate, regular rhythm, normal heart sounds and intact distal pulses. Exam reveals no gallop and no friction rub. No murmur heard. Breast:  bruising lateral left breast and some swelling. No palpable mass. No nipple retraction or nipple discharge. Right side benign. No LAD either side.  Respiratory: Effort normal. No respiratory distress. No chest wall tenderness. Breath sounds normal. No wheezes, rales or rhonchi.  GI: Soft. Bowel sounds are normal. The abdomen is soft and nontender. There is no rebound and no guarding.  Musculoskeletal: Normal range of motion. Extremities are nontender.  Lymphadenopathy: No cervical, preauricular, postauricular or axillary adenopathy is present Skin: Skin is warm and dry. No rash noted. No diaphoresis. No erythema. No pallor. No clubbing, cyanosis, or edema.  Psychiatric: Normal mood and affect. Behavior is normal. Judgment and thought content normal.   Labs Drawn 04/11/20 CBC with slightly elevated WBCs at 12.4k CMET essentially normal.   Assessment and Plan:   Malignant neoplasm of upper-outer quadrant of left breast in female, estrogen receptor positive (CMS-HCC) Pt saw genetics yesterday and had blood drawn. She expects some of the results back in 8-10 days.   She is a candidate for breast conservation if desired.  Would plan seed localized lumpectomy. This would be followed by radiation and antihormonal tx.   We discussed pros and cons of breast conservation vs mastectomy vs bilateral mastectomy. She is younger, but would likely not have survival advantage from bilateral mastectomies. She is concerned about recovery time as it frequently takes her a while to get better after illness or events. She had quite a bit of discomfort after the needle biopsy.   I discussed briefly reconstruction as well. I will send her to see Dr. Iran Planas to get more information to help her make her decision.   Also given her young age and breast density, I have ordered an MRI. I discussed the likelihood of needing additional biopsies.   We are tentatively planning breast conservation unless we get surprises  on the MRI or with genetics.  The surgical procedure was described to the patient. I discussed the incision type and location and that we would need radiology involved on with a seed marker and/or sentinel node.   The risks and benefits of the procedure were described to the patient and she wishes to proceed.   We discussed the risks bleeding, infection, damage to other structures, need for further procedures/surgeries. We discussed the risk of seroma. The patient was advised if the area in the breast in cancer, we may need to go back to surgery for additional tissue to obtain negative margins or for a lymph node biopsy. The patient was advised that these are the most common complications, but that others can occur as well. They were advised against taking aspirin or other anti-inflammatory agents/blood thinners the week before surgery.

## 2021-05-02 ENCOUNTER — Encounter (HOSPITAL_COMMUNITY): Payer: Self-pay | Admitting: General Surgery

## 2021-05-06 ENCOUNTER — Other Ambulatory Visit: Payer: Self-pay | Admitting: General Surgery

## 2021-05-06 DIAGNOSIS — R928 Other abnormal and inconclusive findings on diagnostic imaging of breast: Secondary | ICD-10-CM

## 2021-05-07 LAB — SURGICAL PATHOLOGY

## 2021-05-09 ENCOUNTER — Encounter: Payer: Self-pay | Admitting: *Deleted

## 2021-05-10 DIAGNOSIS — C50412 Malignant neoplasm of upper-outer quadrant of left female breast: Secondary | ICD-10-CM | POA: Diagnosis not present

## 2021-05-10 DIAGNOSIS — R5383 Other fatigue: Secondary | ICD-10-CM | POA: Diagnosis not present

## 2021-05-10 DIAGNOSIS — Z170 Estrogen receptor positive status [ER+]: Secondary | ICD-10-CM | POA: Diagnosis not present

## 2021-05-24 ENCOUNTER — Other Ambulatory Visit: Payer: Self-pay

## 2021-05-24 ENCOUNTER — Inpatient Hospital Stay: Payer: BC Managed Care – PPO | Attending: Hematology | Admitting: Hematology

## 2021-05-24 ENCOUNTER — Other Ambulatory Visit: Payer: BC Managed Care – PPO

## 2021-05-24 ENCOUNTER — Inpatient Hospital Stay: Payer: BC Managed Care – PPO

## 2021-05-24 ENCOUNTER — Encounter: Payer: Self-pay | Admitting: Hematology

## 2021-05-24 VITALS — BP 123/71 | HR 89 | Temp 98.5°F | Resp 17

## 2021-05-24 VITALS — BP 119/69 | HR 79 | Temp 98.5°F | Resp 18 | Wt 118.1 lb

## 2021-05-24 DIAGNOSIS — L7632 Postprocedural hematoma of skin and subcutaneous tissue following other procedure: Secondary | ICD-10-CM | POA: Diagnosis not present

## 2021-05-24 DIAGNOSIS — D509 Iron deficiency anemia, unspecified: Secondary | ICD-10-CM | POA: Diagnosis not present

## 2021-05-24 DIAGNOSIS — R233 Spontaneous ecchymoses: Secondary | ICD-10-CM

## 2021-05-24 DIAGNOSIS — Z170 Estrogen receptor positive status [ER+]: Secondary | ICD-10-CM | POA: Insufficient documentation

## 2021-05-24 DIAGNOSIS — D500 Iron deficiency anemia secondary to blood loss (chronic): Secondary | ICD-10-CM

## 2021-05-24 DIAGNOSIS — R0609 Other forms of dyspnea: Secondary | ICD-10-CM | POA: Insufficient documentation

## 2021-05-24 DIAGNOSIS — D0512 Intraductal carcinoma in situ of left breast: Secondary | ICD-10-CM | POA: Insufficient documentation

## 2021-05-24 LAB — COMPREHENSIVE METABOLIC PANEL
ALT: 15 U/L (ref 0–44)
AST: 19 U/L (ref 15–41)
Albumin: 4.2 g/dL (ref 3.5–5.0)
Alkaline Phosphatase: 61 U/L (ref 38–126)
Anion gap: 4 — ABNORMAL LOW (ref 5–15)
BUN: 9 mg/dL (ref 6–20)
CO2: 29 mmol/L (ref 22–32)
Calcium: 9.4 mg/dL (ref 8.9–10.3)
Chloride: 105 mmol/L (ref 98–111)
Creatinine, Ser: 0.62 mg/dL (ref 0.44–1.00)
GFR, Estimated: >60 mL/min (ref >60)
Glucose, Bld: 83 mg/dL (ref 70–99)
Potassium: 4.1 mmol/L (ref 3.5–5.1)
Sodium: 138 mmol/L (ref 135–145)
Total Bilirubin: 0.4 mg/dL (ref 0.3–1.2)
Total Protein: 7.3 g/dL (ref 6.5–8.1)

## 2021-05-24 LAB — CBC WITH DIFFERENTIAL/PLATELET
Abs Immature Granulocytes: 0.04 10*3/uL (ref 0.00–0.07)
Basophils Absolute: 0 10*3/uL (ref 0.0–0.1)
Basophils Relative: 0 %
Eosinophils Absolute: 0.1 10*3/uL (ref 0.0–0.5)
Eosinophils Relative: 1 %
HCT: 35.6 % — ABNORMAL LOW (ref 36.0–46.0)
Hemoglobin: 11.5 g/dL — ABNORMAL LOW (ref 12.0–15.0)
Immature Granulocytes: 0 %
Lymphocytes Relative: 12 %
Lymphs Abs: 1.1 10*3/uL (ref 0.7–4.0)
MCH: 26.4 pg (ref 26.0–34.0)
MCHC: 32.3 g/dL (ref 30.0–36.0)
MCV: 81.8 fL (ref 80.0–100.0)
Monocytes Absolute: 1 10*3/uL (ref 0.1–1.0)
Monocytes Relative: 10 %
Neutro Abs: 7.1 10*3/uL (ref 1.7–7.7)
Neutrophils Relative %: 77 %
Platelets: 216 10*3/uL (ref 150–400)
RBC: 4.35 MIL/uL (ref 3.87–5.11)
RDW: 14.9 % (ref 11.5–15.5)
WBC: 9.3 10*3/uL (ref 4.0–10.5)
nRBC: 0 % (ref 0.0–0.2)

## 2021-05-24 LAB — IRON AND IRON BINDING CAPACITY (CC-WL,HP ONLY)
Iron: 82 ug/dL (ref 28–170)
Saturation Ratios: 18 % (ref 10.4–31.8)
TIBC: 448 ug/dL (ref 250–450)
UIBC: 366 ug/dL (ref 148–442)

## 2021-05-24 LAB — FERRITIN: Ferritin: 25 ng/mL (ref 11–307)

## 2021-05-24 MED ORDER — SODIUM CHLORIDE 0.9 % IV SOLN
200.0000 mg | Freq: Once | INTRAVENOUS | Status: AC
  Administered 2021-05-24: 200 mg via INTRAVENOUS
  Filled 2021-05-24: qty 200

## 2021-05-24 MED ORDER — SODIUM CHLORIDE 0.9 % IV SOLN: Freq: Once | INTRAVENOUS | Status: AC

## 2021-05-24 NOTE — Patient Instructions (Signed)

## 2021-05-24 NOTE — Progress Notes (Signed)
[facility]   [phone] [phone]   Clinic Follow up Note   Patient Care Team: Pcp, No as PCP - General Beth Kaufmann, RN as Oncology Nurse Navigator Rockwell Germany, RN as Oncology Nurse Navigator  Date of Service:  05/24/2021  CHIEF COMPLAINT: hematoma after breast surgery   CURRENT THERAPY:  Pending radiation  ASSESSMENT & PLAN:  Beth Stephens is a 46 y.o. female with   1. Left breast DCIS, grade 2, ER+/PR+ -found on screening mammogram. Biopsy on 04/02/21 confirmed intermediate grade DCIS. -genetic testing was negative. -left lumpectomy on 05/01/21 by Dr. Barry Dienes showed 1 cm DCIS, margins negative. -she developed a rather large severe hematoma after surgery. She has had at least 200 cc drained from the area after surgery. Physical exam today shows extensive bruising and hard tissue to palpation in majority of left breast.  -given her DCIS was cured with surgery alone, any adjuvant therapy is preventive  -she is already scheduled for f/u with rad onc on 06/18/21. -Plan to start tamoxifen after adjuvant radiation.  I reassured her that it is okay to wait for radiation and tamoxifen since they are all preventative, there is no risk of cancer recurrence.  2. Rule out bleeding disorder  -She had a quite extensive dental bleeding after tooth extraction when she was young, and easy bruising of her whole life, no family history of bleeding disorder -pre-op PT was normal, CBC was also normal -plan to check APTT, von Willebrand panel, and platelet function after she recovers from the hematoma  2.  Symptomatic anemia from bleeding and mild iron deficiency, dyspnea on exertion  -Hemoglobin was 10.5 after breast surgery due to large hematoma -she has developed dyspnea on exertion since then, probably related to her anemia.  No hypoxia on 6 mons walking, no leg edema or tenderness, no clinical suspicion for PE or DVT. -She has started oral iron a few  weeks ago, lab today showed hemoglobin 11.5, ferritin 25.  Due to her symptomatic anemia, I will give 1 dose Venofer 200 mg today to see if her dyspnea improves.  -continue oral iron for 3 months     PLAN:  -lab done today -iv Venofer 200 mg today -Lab including PT, APTT, platelet function and plan [facility] panel in a month -f/u after RT to start tamoxifen.   No problem-specific Assessment & Plan notes found for this encounter.   SUMMARY OF ONCOLOGIC HISTORY: Oncology History Overview Note   Cancer Staging  Ductal carcinoma in situ (DCIS) of left breast Staging form: Breast, AJCC 8th Edition - Clinical stage from 04/02/2021: Stage 0 (cTis (DCIS), cN0, cM0, G3, ER+, PR+, HER2: Not Assessed) - Signed by Truitt Merle, MD on 04/09/2021 - Pathologic stage from 05/01/2021: Stage Unknown (pTis (DCIS), pNX, cM0, G2, ER+, PR+, HER2: Not Assessed) - Signed by Truitt Merle, MD on 05/23/2021    Ductal carcinoma in situ (DCIS) of left breast  01/18/2021 Mammogram   Exam: 3D Mammogram Diagnostic Digital - L  IMPRESSION: The 0.3 cm grouped round calcifications in the left breast are suspicious.   04/02/2021 Initial Biopsy   Diagnosis 1. Breast, left, needle core biopsy, left breast calcification @ 3:00 6 cmfn  - DUCTAL CARCINOMA IN SITU WITH NECROSIS AND CALCIFICATIONS  - SEE COMMENT Microscopic Comment Immunohistochemistry was performed on the biopsy to assess for an invasive component (SMM, calponin, CD34 and p63). Myoepithelial cells are present supporting the diagnosis of ductal carcinoma in situ. Based on the  biopsy, the ductal carcinoma in situ cribriform and solid patterns, intermediate nuclear grade, and measures 0.6 cm in greatest linear extent.  1. PROGNOSTIC INDICATORS Results: Estrogen Receptor: 100%, POSITIVE, STRONG STAINING INTENSITY Progesterone Receptor: 70%, POSITIVE, STRONG STAINING INTENSITY   04/02/2021 Cancer Staging   Staging form: Breast, AJCC 8th Edition - Clinical stage from  04/02/2021: Stage 0 (cTis (DCIS), cN0, cM0, G3, ER+, PR+, HER2: Not Assessed) - Signed by Truitt Merle, MD on 04/09/2021 Stage prefix: Initial diagnosis Histologic grading system: 3 grade system    04/08/2021 Initial Diagnosis   Ductal carcinoma in situ (DCIS) of left breast    Genetic Testing   [facility] CancerNext-Expanded Panel was Negative. Report date is 04/20/2021.  The CancerNext-Expanded gene panel offered by [facility] and includes sequencing, rearrangement, and RNA analysis for the following 77 genes: AIP, ALK, APC, ATM, AXIN2, BAP1, BARD1, BLM, BMPR1A, BRCA1, BRCA2, BRIP1, CDC73, CDH1, CDK4, CDKN1B, CDKN2A, CHEK2, CTNNA1, DICER1, FANCC, FH, FLCN, GALNT12, KIF1B, LZTR1, MAX, MEN1, MET, MLH1, MSH2, MSH3, MSH6, MUTYH, NBN, NF1, NF2, NTHL1, PALB2, PHOX2B, PMS2, POT1, PRKAR1A, PTCH1, PTEN, RAD51C, RAD51D, RB1, RECQL, RET, SDHA, SDHAF2, SDHB, SDHC, SDHD, SMAD4, SMARCA4, SMARCB1, SMARCE1, STK11, SUFU, TMEM127, TP53, TSC1, TSC2, VHL and XRCC2 (sequencing and deletion/duplication); EGFR, EGLN1, HOXB13, KIT, MITF, PDGFRA, POLD1, and POLE (sequencing only); EPCAM and GREM1 (deletion/duplication only).    04/26/2021 Imaging   EXAM: BILATERAL BREAST MRI WITH AND WITHOUT CONTRAST  IMPRESSION: 1. No significant enhancement at the site of known ductal carcinoma in situ in the LATERAL portion of the LEFT breast. There is minimal enhancement along the biopsy tract in this region. 2. 0.8 centimeter indeterminate enhancing mass in the posterior LOWER INNER QUADRANT of the LEFT breast. 3. RIGHT breast is negative.   05/01/2021 Cancer Staging   Staging form: Breast, AJCC 8th Edition - Pathologic stage from 05/01/2021: Stage Unknown (pTis (DCIS), pNX, cM0, G2, ER+, PR+, HER2: Not Assessed) - Signed by Truitt Merle, MD on 05/23/2021 Stage prefix: Initial diagnosis Histologic grading system: 3 grade system Residual tumor (R): R0 - None    05/01/2021 Pathology Results   FINAL MICROSCOPIC DIAGNOSIS:   A. BREAST, LEFT  MEDIAL @ 7:30, EXCISION:  - Fibrocystic changes with calcifications.  - No malignancy identified.   B. BREAST, LEFT @ 3;00, LUMPECTOMY:  - Ductal carcinoma in situ, 1 cm.  - All surgical margins negative for DCIS.  - Fibrocystic changes with diffuse sclerosing adenosis and  calcifications.  - Biopsy site and biopsy clip.  - See oncology table.       INTERVAL HISTORY:  Beth Stephens is here for a follow up of DCIS. She was last seen by me on 04/10/21 in consultation. She presents to the clinic alone. She developed a hematoma shortly after surgery. She reports it developed very quickly after surgery finished. She denies requiring blood transfusion but was started on oral iron. She also reports shortness of breath since surgery.   All other systems were reviewed with the patient and are negative.  MEDICAL HISTORY:  Past Medical History:  Diagnosis Date   Cancer ([place])    Breast Cancer   Colitis    Colitis     SURGICAL HISTORY: Past Surgical History:  Procedure Laterality Date   BREAST LUMPECTOMY WITH RADIOACTIVE SEED LOCALIZATION Left 05/01/2021   Procedure: LEFT BREAST LUMPECTOMY WITH RADIOACTIVE SEED LOCALIZATION;  Surgeon: Stark Klein, MD;  Location: [facility];  Service: General;  Laterality: Left;   COLONOSCOPY     WISDOM TOOTH EXTRACTION  I have reviewed the social history and family history with the patient and they are unchanged from previous note.  ALLERGIES:  is allergic to gluten meal and sumatriptan.  MEDICATIONS:  Current Outpatient Medications  Medication Sig Dispense Refill   oxyCODONE (OXY IR/ROXICODONE) 5 MG immediate release tablet Take 1 tablet (5 mg total) by mouth every 6 (six) hours as needed for severe pain. 5 tablet 0   sulfaSALAzine (AZULFIDINE) 500 MG tablet Take 1,000 mg by mouth See admin instructions. Take 2 tablets (1000 mg) by mouth daily after lunch, may take 2 tablets (1000 mg) two more times in the day as needed for colitis flare      valACYclovir (VALTREX) 1000 MG tablet 1 tablet     No current facility-administered medications for this visit.    PHYSICAL EXAMINATION: ECOG PERFORMANCE STATUS: 1 - Symptomatic but completely ambulatory  Vitals:   05/24/21 0921  BP: 119/69  Pulse: 79  Resp: 18  Temp: 98.5 F (36.9 C)  SpO2: 99%   Wt Readings from Last 3 Encounters:  05/24/21 118 lb 1 oz (53.6 kg)  05/01/21 115 lb (52.2 kg)  04/23/21 115 lb (52.2 kg)     GENERAL:alert, no distress and comfortable SKIN: skin color normal, no rashes or significant lesions EYES: normal, Conjunctiva are pink and non-injected, sclera clear  NEURO: alert & oriented x 3 with fluent speech BREAST: Status post left lumpectomy.  There is extensive ecchymosis and a large hematoma in the majority lateral side of her left breast. Right breast exam benign. No adenopahty   LABORATORY DATA:  I have reviewed the data as listed CBC Latest Ref Rng & Units 05/24/2021 04/11/2021  WBC 4.0 - 10.5 K/uL 9.3 12.4(H)  Hemoglobin 12.0 - 15.0 g/dL 11.5(L) 12.3  Hematocrit 36.0 - 46.0 % 35.6(L) 39.3  Platelets 150 - 400 K/uL 216 213     CMP Latest Ref Rng & Units 05/24/2021 04/11/2021  Glucose 70 - 99 mg/dL 83 105(H)  BUN 6 - 20 mg/dL 9 17  Creatinine 0.44 - 1.00 mg/dL 0.62 0.65  Sodium 135 - 145 mmol/L 138 140  Potassium 3.5 - 5.1 mmol/L 4.1 4.0  Chloride 98 - 111 mmol/L 105 108  CO2 22 - 32 mmol/L 29 25  Calcium 8.9 - 10.3 mg/dL 9.4 9.1  Total Protein 6.5 - 8.1 g/dL 7.3 7.6  Total Bilirubin 0.3 - 1.2 mg/dL 0.4 0.4  Alkaline Phos 38 - 126 U/L 61 56  AST 15 - 41 U/L 19 16  ALT 0 - 44 U/L 15 13      RADIOGRAPHIC STUDIES: I have personally reviewed the radiological images as listed and agreed with the findings in the report. No results found.    Orders Placed This Encounter  Procedures   CBC with Differential/Platelet    Standing Status:   Standing    Number of Occurrences:   50    Standing Expiration Date:   05/24/2022   Ferritin     Standing Status:   Standing    Number of Occurrences:   20    Standing Expiration Date:   05/24/2022   Iron and TIBC    Standing Status:   Standing    Number of Occurrences:   20    Standing Expiration Date:   05/24/2022   All questions were answered. The patient knows to call the clinic with any problems, questions or concerns. No barriers to learning was detected. The total time spent in the appointment was 4  minutes.     Truitt Merle, MD 05/24/2021   I, Wilburn Mylar, am acting as scribe for Truitt Merle, MD.   I have reviewed the above documentation for accuracy and completeness, and I agree with the above.

## 2021-05-25 ENCOUNTER — Encounter: Payer: Self-pay | Admitting: Hematology

## 2021-05-27 ENCOUNTER — Telehealth: Payer: Self-pay | Admitting: Hematology

## 2021-05-27 ENCOUNTER — Encounter: Payer: Self-pay | Admitting: Hematology

## 2021-05-27 NOTE — Telephone Encounter (Signed)
Scheduled follow-up appointment per 2/24 los. Patient is aware.

## 2021-05-28 ENCOUNTER — Telehealth: Payer: Self-pay

## 2021-05-28 ENCOUNTER — Ambulatory Visit: Payer: BC Managed Care – PPO

## 2021-05-28 NOTE — Telephone Encounter (Signed)
LVM trying to confirm Venofer appt today at 1:30pm.  Asked pt to contact Dr. Ernestina Penna office to confirm whether she can come today.  Pt returned call stating she cannot come today and would prefer to come in on Wednesday at anytime.  Notified Infusion of pt's response.  Will send MyChart message to pt with infusion appt information.

## 2021-05-29 ENCOUNTER — Ambulatory Visit: Payer: BC Managed Care – PPO | Admitting: Radiation Oncology

## 2021-05-29 ENCOUNTER — Other Ambulatory Visit: Payer: Self-pay

## 2021-05-29 ENCOUNTER — Ambulatory Visit: Payer: BC Managed Care – PPO

## 2021-05-29 ENCOUNTER — Inpatient Hospital Stay: Payer: BC Managed Care – PPO | Attending: Hematology

## 2021-05-29 VITALS — BP 108/56 | HR 71 | Temp 98.2°F | Resp 17

## 2021-05-29 DIAGNOSIS — D509 Iron deficiency anemia, unspecified: Secondary | ICD-10-CM | POA: Insufficient documentation

## 2021-05-29 DIAGNOSIS — D500 Iron deficiency anemia secondary to blood loss (chronic): Secondary | ICD-10-CM

## 2021-05-29 MED ORDER — SODIUM CHLORIDE 0.9 % IV SOLN: Freq: Once | INTRAVENOUS | Status: AC

## 2021-05-29 MED ORDER — SODIUM CHLORIDE 0.9 % IV SOLN
200.0000 mg | Freq: Once | INTRAVENOUS | Status: AC
  Administered 2021-05-29: 200 mg via INTRAVENOUS
  Filled 2021-05-29: qty 200

## 2021-06-04 ENCOUNTER — Encounter: Payer: Self-pay | Admitting: *Deleted

## 2021-06-13 ENCOUNTER — Encounter (HOSPITAL_COMMUNITY): Payer: Self-pay

## 2021-06-17 ENCOUNTER — Other Ambulatory Visit: Payer: Self-pay

## 2021-06-17 DIAGNOSIS — D0512 Intraductal carcinoma in situ of left breast: Secondary | ICD-10-CM | POA: Diagnosis not present

## 2021-06-17 DIAGNOSIS — D500 Iron deficiency anemia secondary to blood loss (chronic): Secondary | ICD-10-CM

## 2021-06-17 DIAGNOSIS — Z170 Estrogen receptor positive status [ER+]: Secondary | ICD-10-CM | POA: Diagnosis not present

## 2021-06-17 NOTE — Progress Notes (Incomplete)
Location of Breast Cancer:  ?Ductal carcinoma in situ (DCIS) of left breast ? ?Histology per Pathology Report:  ?05/01/2021 ?FINAL MICROSCOPIC DIAGNOSIS:  ?A. BREAST, LEFT MEDIAL @ 7:30, EXCISION:  ?- Fibrocystic changes with calcifications.  ?- No malignancy identified.  ?B. BREAST, LEFT @ 3;00, LUMPECTOMY:  ?- Ductal carcinoma in situ, 1 cm.  ?- All surgical margins negative for DCIS.  ?- Fibrocystic changes with diffuse sclerosing adenosis and  ?calcifications.  ?- Biopsy site and biopsy clip.  ? ?Receptor Status: ER(100%), PR (70%) ? ?Did patient present with symptoms (if so, please note symptoms) or was this found on screening mammography?: Patient had screening detected left breast calcifications. Diagnostic imaging confirmed this. It showed 3 mm of grouped calcifications in a C density breast. These were in the upper outer quadrant.  ? ?Past/Anticipated interventions by surgeon, if any:  ?05/01/2021 ?--Dr. Stark Klein ?Left Breast Radioactive seed localized lumpectomy and left breast seed localized excisional biopsy ? ?Past/Anticipated interventions by medical oncology, if any:  ?Under care of Dr. Truitt Merle ?05/24/2021 ?genetic testing was negative. ?left lumpectomy on 05/01/21 by Dr. Barry Dienes showed 1 cm DCIS, margins negative ?she developed a rather large severe hematoma after surgery. She has had at least 200 cc drained from the area after surgery.  ?Physical exam today shows extensive bruising and hard tissue to palpation in majority of left breast.  ?given her DCIS was cured with surgery alone, any adjuvant therapy is preventive  ?she is already scheduled for f/u with rad onc on 06/18/21. ?Plan to start tamoxifen after adjuvant radiation.  I reassured her that it is okay to wait for radiation and tamoxifen since they are all preventative, there is no risk of cancer recurrence. ?--PLAN:  ?lab done today ?iv Venofer 200 mg today ?Lab including PT, APTT, platelet function and plan [facility] panel in a month ?f/u  after RT to start tamoxifen. ? ?Lymphedema issues, if any: Patient denies, but does report some tightness/discomfort with range of motion. Reports    ? ?Pain issues, if any:  Reports occasional sensitivity and tenderness from lingering hematoma   ? ?SAFETY ISSUES: ?Prior radiation? No ?Pacemaker/ICD? No ?Possible current pregnancy? No--LMP: ~05/29/2021 ?Is the patient on methotrexate? *** ? ?Current Complaints / other details:  NOthing else of note ?   ? ? ? ?

## 2021-06-17 NOTE — Progress Notes (Signed)
?Radiation Oncology         [phone] ?________________________________ ? ?Name: Consepcion Stephens MRN: [id number]  ?Date: 06/18/2021  DOB: 15-Mar-1976 ? ?Follow-Up Visit Note ? ?Outpatient ? ?CC: Pcp, No  Truitt Merle, MD ? ?Diagnosis:    ?  ICD-10-CM   ?1. Ductal carcinoma in situ (DCIS) of left breast  D05.12 Ambulatory referral to Physical Therapy  ?  ?  ? ?S/p lumpectomy: Stage 0, Left breast, Intermediate grade DCIS, ER+/PR+/Her2 not assessed ? ? Cancer Staging  ?Ductal carcinoma in situ (DCIS) of left breast ?Staging form: Breast, AJCC 8th Edition ?- Clinical stage from 04/02/2021: Stage 0 (cTis (DCIS), cN0, cM0, G3, ER+, PR+, HER2: Not Assessed) - Signed by Truitt Merle, MD on 04/09/2021 ?- Pathologic stage from 05/01/2021: Stage Unknown (pTis (DCIS), pNX, cM0, G2, ER+, PR+, HER2: Not Assessed) - Signed by Truitt Merle, MD on 05/23/2021 ? ? ?CHIEF COMPLAINT: Here to discuss management of left breast DCIS ? ?Narrative:  The patient returns today for follow-up.   ?  ?Since consultation date of 04/09/21, genetic testing performed on 04/11/21 revealed no clinically significant variants detected by +RNAinsight and BRCAplus testing.  ? ?The patient opted to proceed with left breast lumpectomy on 05/01/21 under the care of Dr. Barry Dienes. Pathology from the procedure revealed: tumor size of 1 cm; histology of intermediate grade ductal carcinoma in-situ, with fibrocystic changes, diffuse sclerosing adenosis, and  ?calcifications ; all margins negative for DCIS; margin status to in situ disease of 10 mm from the closest margin (not indicated); no lymph nodes were examined;  ER status: 100% positive; PR status 70% positive (both with strong staining intensity), Her2 not assessed. Left medial excision (7:30 o'clock position) also performed revealed no evidence of malignancy, with fibrocystic changes and calcifications.  ? ?The patient developed a significant hematoma following surgery. Dr. Barry Dienes aspirated 40 cc's from the hematoma on  05/10/21, another 75 mL on 05/13/21, 70 mL on 05/20/21, and 70 mL on 05/27/21. She was also started on oral iron.  ? ?The hematoma was further evaluated by Dr. Burr Medico on 05/24/21. Per encounter notes, the patient reported bruising easily her whole life, and denied any hereditary bleeding disorders. To further assess to her history of excessive bleeding, Dr. Burr Medico ordered labs including PT, APTT, platelet function, and a [place] panel. The patient will return to Dr. Burr Medico following RT to start tamoxifen.  ? ?Pertinent imaging performed since the patient was last seen includes:  ?-- Bilateral breast MRI on 04/26/21 showed no significant enhancement at the site of known ductal carcinoma in situ in the lateral portion of the right breast. Minimal enhancement was appreciated along the biopsy tract in this region. MRI also showed a 0.8 cm indeterminate enhancing mass in the posterior lower inner quadrant of the left breast. No abnormalities were appreciated in the right breast.  ? ?Symptomatically, the patient reports: ~05-29-21; swelling/firmness of left breast and tenderness.  Pulling sensation in axilla when raising arm.  ?       ?ALLERGIES:  is allergic to gluten meal and sumatriptan. ? ?Meds: ?Current Outpatient Medications  ?Medication Sig Dispense Refill  ? sulfaSALAzine (AZULFIDINE) 500 MG tablet Take 1,000 mg by mouth See admin instructions. Take 2 tablets (1000 mg) by mouth daily after lunch, may take 2 tablets (1000 mg) two more times in the day as needed for colitis flare    ? ?No current facility-administered medications for this encounter.  ? ? ?Physical Findings: ? height is $RemoveB'5\' 3"'YUzvaPWp$  (1.6 m)  and weight is 116 lb 9.6 oz (52.9 kg). Her temperature is 97.7 ?F (36.5 ?C). Her blood pressure is 114/62 and her pulse is 72. Her respiration is 18 and oxygen saturation is 100%. .    ? ?General: Alert and oriented, in no acute distress ?HEENT: Head is normocephalic. Extraocular movements are intact. Extremities: No cyanosis or  edema. ?Musculoskeletal: decent ROM in left arm, can raise over head ?Neurologic: No obvious focalities. Speech is fluent.  ?Psychiatric: Judgment and insight are intact. Affect is appropriate. ?Breast exam reveals swelling in left breast with lateral firmness consistent with large hematoma. No oozing from lumpectomy scar. ? ?Lab Findings: ?Lab Results  ?Component Value Date  ? WBC 9.3 05/24/2021  ? HGB 11.5 (L) 05/24/2021  ? HCT 35.6 (L) 05/24/2021  ? MCV 81.8 05/24/2021  ? PLT 216 05/24/2021  ? ? ?$Re'@LASTCHEMISTRY'LLu$ @ ? ?Radiographic Findings: ?No results found. ? ?Impression/Plan: DCIS, left breast ? ?We discussed adjuvant radiotherapy today.  I recommend 16 fractions (3wks) of treatment to the left breast, no boost, in order to reduce risk of local recurrence by half.  I reviewed the logistics, benefits, risks, and potential side effects of this treatment in detail. Risks may include but not necessary be limited to acute and late injury tissue in the radiation fields such as skin irritation (change in color/pigmentation, itching, dryness, pain, peeling). She may experience fatigue. We also discussed possible risk of long term cosmetic changes or scar tissue. There is also a smaller risk for lung toxicity, cardiac toxicity, brachial plexopathy, lymphedema, musculoskeletal changes, rib fragility or induction of a second malignancy, late chronic non-healing soft tissue wound.  We discussed the pros and cons of delaying RT further in light of her persistent hematoma. This is not likely to resolve soon, and theoretically, starting RT within 2 months of surgery is optimal to prevent repopulation of carcinomatous cells. I do not recommend a boost as I think it would provide minimal benefit (she has good margins) and it would increase her risk of peri-lumpectomy cavity fibrosis. I recommend PT now to help with breast swelling and axillary tightness. Will make referral. ? ?Studies are pending for bleeding disorders w/ Dr  Burr Medico.  ? ?The patient asked good questions which I answered to her satisfaction. She is enthusiastic about proceeding with treatment. A consent form has been  signed and placed in her chart. We will proceed with CT simulation today and start RT in a week.  Urine pregnancy test ordered as a precaution prior to radiation therapy, although she is certain that she is not pregnant. ? ? ?On date of service, in total, I spent 30 minutes on this encounter. Patient was seen in person.  ?_____________________________________ ? ? ?Eppie Gibson, MD ? ?This document serves as a record of services personally performed by Eppie Gibson, MD. It was created on her behalf by Roney Mans, a trained medical scribe. The creation of this record is based on the scribe's personal observations and the provider's statements to them. This document has been checked and approved by the attending provider. ? ?

## 2021-06-18 ENCOUNTER — Ambulatory Visit
Admission: RE | Admit: 2021-06-18 | Discharge: 2021-06-18 | Disposition: A | Discharge status: Home / Self Care | Payer: BC Managed Care – PPO | Source: Ambulatory Visit | Attending: Radiation Oncology | Admitting: Radiation Oncology

## 2021-06-18 ENCOUNTER — Inpatient Hospital Stay: Payer: BC Managed Care – PPO

## 2021-06-18 ENCOUNTER — Other Ambulatory Visit: Payer: Self-pay

## 2021-06-18 VITALS — BP 114/62 | HR 72 | Temp 97.7°F | Resp 18 | Ht 63.0 in | Wt 116.6 lb

## 2021-06-18 DIAGNOSIS — D0512 Intraductal carcinoma in situ of left breast: Secondary | ICD-10-CM | POA: Diagnosis not present

## 2021-06-18 DIAGNOSIS — Z510 Encounter for antineoplastic radiation therapy: Secondary | ICD-10-CM | POA: Insufficient documentation

## 2021-06-18 DIAGNOSIS — Z170 Estrogen receptor positive status [ER+]: Secondary | ICD-10-CM | POA: Insufficient documentation

## 2021-06-18 DIAGNOSIS — D0511 Intraductal carcinoma in situ of right breast: Secondary | ICD-10-CM | POA: Insufficient documentation

## 2021-06-18 DIAGNOSIS — D500 Iron deficiency anemia secondary to blood loss (chronic): Secondary | ICD-10-CM

## 2021-06-19 ENCOUNTER — Encounter: Payer: Self-pay | Admitting: Radiation Oncology

## 2021-06-21 ENCOUNTER — Other Ambulatory Visit: Payer: Self-pay

## 2021-06-21 ENCOUNTER — Inpatient Hospital Stay: Payer: BC Managed Care – PPO

## 2021-06-21 DIAGNOSIS — D509 Iron deficiency anemia, unspecified: Secondary | ICD-10-CM | POA: Diagnosis not present

## 2021-06-21 DIAGNOSIS — Z510 Encounter for antineoplastic radiation therapy: Secondary | ICD-10-CM | POA: Diagnosis not present

## 2021-06-21 DIAGNOSIS — D0512 Intraductal carcinoma in situ of left breast: Secondary | ICD-10-CM

## 2021-06-21 DIAGNOSIS — Z170 Estrogen receptor positive status [ER+]: Secondary | ICD-10-CM | POA: Diagnosis not present

## 2021-06-21 DIAGNOSIS — D500 Iron deficiency anemia secondary to blood loss (chronic): Secondary | ICD-10-CM

## 2021-06-21 DIAGNOSIS — D0511 Intraductal carcinoma in situ of right breast: Secondary | ICD-10-CM | POA: Diagnosis not present

## 2021-06-21 LAB — CMP (CANCER CENTER ONLY)
ALT: 17 U/L (ref 0–44)
AST: 18 U/L (ref 15–41)
Albumin: 4.1 g/dL (ref 3.5–5.0)
Alkaline Phosphatase: 59 U/L (ref 38–126)
Anion gap: 6 (ref 5–15)
BUN: 10 mg/dL (ref 6–20)
CO2: 26 mmol/L (ref 22–32)
Calcium: 9.2 mg/dL (ref 8.9–10.3)
Chloride: 107 mmol/L (ref 98–111)
Creatinine: 0.57 mg/dL (ref 0.44–1.00)
GFR, Estimated: >60 mL/min (ref >60)
Glucose, Bld: 85 mg/dL (ref 70–99)
Potassium: 4.2 mmol/L (ref 3.5–5.1)
Sodium: 139 mmol/L (ref 135–145)
Total Bilirubin: 0.4 mg/dL (ref 0.3–1.2)
Total Protein: 7 g/dL (ref 6.5–8.1)

## 2021-06-21 LAB — CBC WITH DIFFERENTIAL (CANCER CENTER ONLY)
Abs Immature Granulocytes: 0.03 10*3/uL (ref 0.00–0.07)
Basophils Absolute: 0 10*3/uL (ref 0.0–0.1)
Basophils Relative: 0 %
Eosinophils Absolute: 0.1 10*3/uL (ref 0.0–0.5)
Eosinophils Relative: 1 %
HCT: 37.1 % (ref 36.0–46.0)
Hemoglobin: 11.8 g/dL — ABNORMAL LOW (ref 12.0–15.0)
Immature Granulocytes: 0 %
Lymphocytes Relative: 20 %
Lymphs Abs: 1.8 10*3/uL (ref 0.7–4.0)
MCH: 26.1 pg (ref 26.0–34.0)
MCHC: 31.8 g/dL (ref 30.0–36.0)
MCV: 82.1 fL (ref 80.0–100.0)
Monocytes Absolute: 0.9 10*3/uL (ref 0.1–1.0)
Monocytes Relative: 9 %
Neutro Abs: 6.5 10*3/uL (ref 1.7–7.7)
Neutrophils Relative %: 70 %
Platelet Count: 188 10*3/uL (ref 150–400)
RBC: 4.52 MIL/uL (ref 3.87–5.11)
RDW: 14 % (ref 11.5–15.5)
WBC Count: 9.3 10*3/uL (ref 4.0–10.5)
nRBC: 0 % (ref 0.0–0.2)

## 2021-06-21 LAB — PREGNANCY, URINE: Preg Test, Ur: NEGATIVE

## 2021-06-21 LAB — IRON AND IRON BINDING CAPACITY (CC-WL,HP ONLY)
Iron: 94 ug/dL (ref 28–170)
Saturation Ratios: 26 % (ref 10.4–31.8)
TIBC: 356 ug/dL (ref 250–450)
UIBC: 262 ug/dL (ref 148–442)

## 2021-06-21 LAB — PROTIME-INR
INR: 1 (ref 0.8–1.2)
Prothrombin Time: 13.3 seconds (ref 11.4–15.2)

## 2021-06-21 LAB — FERRITIN: Ferritin: 98 ng/mL (ref 11–307)

## 2021-06-21 LAB — APTT: aPTT: 35 seconds (ref 24–36)

## 2021-06-23 LAB — VON WILLEBRAND PANEL
Coagulation Factor VIII: 82 % (ref 56–140)
Ristocetin Co-factor, Plasma: 65 % (ref 50–200)
Von Willebrand Antigen, Plasma: 73 % (ref 50–200)

## 2021-06-23 LAB — COAG STUDIES INTERP REPORT

## 2021-06-24 ENCOUNTER — Encounter: Payer: Self-pay | Admitting: *Deleted

## 2021-06-24 ENCOUNTER — Other Ambulatory Visit: Payer: Self-pay

## 2021-06-24 ENCOUNTER — Encounter: Payer: Self-pay | Admitting: Hematology

## 2021-06-24 ENCOUNTER — Ambulatory Visit
Admission: RE | Admit: 2021-06-24 | Discharge: 2021-06-24 | Disposition: A | Discharge status: Home / Self Care | Payer: BC Managed Care – PPO | Source: Ambulatory Visit | Attending: Radiation Oncology | Admitting: Radiation Oncology

## 2021-06-24 DIAGNOSIS — D0512 Intraductal carcinoma in situ of left breast: Secondary | ICD-10-CM

## 2021-06-24 DIAGNOSIS — D0511 Intraductal carcinoma in situ of right breast: Secondary | ICD-10-CM | POA: Diagnosis not present

## 2021-06-24 DIAGNOSIS — Z510 Encounter for antineoplastic radiation therapy: Secondary | ICD-10-CM | POA: Diagnosis not present

## 2021-06-24 DIAGNOSIS — Z170 Estrogen receptor positive status [ER+]: Secondary | ICD-10-CM | POA: Diagnosis not present

## 2021-06-24 MED ORDER — RADIAPLEXRX EX GEL: Freq: Once | CUTANEOUS | Status: AC

## 2021-06-24 NOTE — Progress Notes (Signed)
Pt here for patient teaching.   ? ?Pt given Radiation and You booklet, skin care instructions, and Radiaplex gel.   ? ?Reviewed areas of pertinence such as fatigue, hair loss, skin changes, breast tenderness, and breast swelling .  ? ?Pt able to give teach back of to pat skin, use unscented/gentle soap, and drink plenty of water,apply Radiaplex bid, avoid applying anything to skin within 4 hours of treatment, avoid wearing an under wire bra, and to use an electric razor if they must shave.  ? ?Pt demonstrated understanding and verbalizes understanding of information given and will contact nursing with any questions or concerns.   ? ?Http://[email or web address]/treatmentinformation/whattoexpect/index ?  ? ? ? ? ? ? ?

## 2021-06-25 ENCOUNTER — Telehealth: Payer: Self-pay | Admitting: Hematology

## 2021-06-25 ENCOUNTER — Ambulatory Visit
Admission: RE | Admit: 2021-06-25 | Discharge: 2021-06-25 | Disposition: A | Discharge status: Home / Self Care | Payer: BC Managed Care – PPO | Source: Ambulatory Visit | Attending: Radiation Oncology | Admitting: Radiation Oncology

## 2021-06-25 DIAGNOSIS — Z510 Encounter for antineoplastic radiation therapy: Secondary | ICD-10-CM | POA: Diagnosis not present

## 2021-06-25 DIAGNOSIS — D0511 Intraductal carcinoma in situ of right breast: Secondary | ICD-10-CM | POA: Diagnosis not present

## 2021-06-25 DIAGNOSIS — Z170 Estrogen receptor positive status [ER+]: Secondary | ICD-10-CM | POA: Diagnosis not present

## 2021-06-25 DIAGNOSIS — D0512 Intraductal carcinoma in situ of left breast: Secondary | ICD-10-CM | POA: Diagnosis not present

## 2021-06-25 LAB — PLATELET AGGREGATION STUDY, BLOOD

## 2021-06-25 LAB — PLATELET FUNCTION ASSAY: Collagen / Epinephrine: 131 seconds (ref 0–193)

## 2021-06-25 NOTE — Telephone Encounter (Signed)
.  Called patient to schedule appointment per 3/28 inbasket, patient is aware of date and time.   ?

## 2021-06-26 ENCOUNTER — Ambulatory Visit
Admission: RE | Admit: 2021-06-26 | Discharge: 2021-06-26 | Disposition: A | Discharge status: Home / Self Care | Payer: BC Managed Care – PPO | Source: Ambulatory Visit | Attending: Radiation Oncology | Admitting: Radiation Oncology

## 2021-06-26 ENCOUNTER — Other Ambulatory Visit: Payer: Self-pay

## 2021-06-26 DIAGNOSIS — Z510 Encounter for antineoplastic radiation therapy: Secondary | ICD-10-CM | POA: Diagnosis not present

## 2021-06-26 DIAGNOSIS — D0511 Intraductal carcinoma in situ of right breast: Secondary | ICD-10-CM | POA: Diagnosis not present

## 2021-06-26 DIAGNOSIS — D0512 Intraductal carcinoma in situ of left breast: Secondary | ICD-10-CM | POA: Diagnosis not present

## 2021-06-26 DIAGNOSIS — Z170 Estrogen receptor positive status [ER+]: Secondary | ICD-10-CM | POA: Diagnosis not present

## 2021-06-27 ENCOUNTER — Other Ambulatory Visit: Payer: Self-pay

## 2021-06-27 ENCOUNTER — Ambulatory Visit
Admission: RE | Admit: 2021-06-27 | Discharge: 2021-06-27 | Disposition: A | Discharge status: Home / Self Care | Payer: BC Managed Care – PPO | Source: Ambulatory Visit | Attending: Radiation Oncology | Admitting: Radiation Oncology

## 2021-06-27 DIAGNOSIS — Z170 Estrogen receptor positive status [ER+]: Secondary | ICD-10-CM | POA: Diagnosis not present

## 2021-06-27 DIAGNOSIS — D0512 Intraductal carcinoma in situ of left breast: Secondary | ICD-10-CM | POA: Diagnosis not present

## 2021-06-27 DIAGNOSIS — D0511 Intraductal carcinoma in situ of right breast: Secondary | ICD-10-CM | POA: Diagnosis not present

## 2021-06-27 DIAGNOSIS — Z510 Encounter for antineoplastic radiation therapy: Secondary | ICD-10-CM | POA: Diagnosis not present

## 2021-06-28 ENCOUNTER — Ambulatory Visit
Admission: RE | Admit: 2021-06-28 | Discharge: 2021-06-28 | Disposition: A | Discharge status: Home / Self Care | Payer: BC Managed Care – PPO | Source: Ambulatory Visit | Attending: Radiation Oncology | Admitting: Radiation Oncology

## 2021-06-28 DIAGNOSIS — D0512 Intraductal carcinoma in situ of left breast: Secondary | ICD-10-CM | POA: Diagnosis not present

## 2021-06-28 DIAGNOSIS — Z510 Encounter for antineoplastic radiation therapy: Secondary | ICD-10-CM | POA: Diagnosis not present

## 2021-06-28 DIAGNOSIS — D0511 Intraductal carcinoma in situ of right breast: Secondary | ICD-10-CM | POA: Diagnosis not present

## 2021-06-28 DIAGNOSIS — Z170 Estrogen receptor positive status [ER+]: Secondary | ICD-10-CM | POA: Diagnosis not present

## 2021-07-01 ENCOUNTER — Ambulatory Visit
Admission: RE | Admit: 2021-07-01 | Discharge: 2021-07-01 | Disposition: A | Discharge status: Home / Self Care | Payer: BC Managed Care – PPO | Source: Ambulatory Visit | Attending: Radiation Oncology | Admitting: Radiation Oncology

## 2021-07-01 ENCOUNTER — Other Ambulatory Visit: Payer: Self-pay

## 2021-07-01 DIAGNOSIS — Z170 Estrogen receptor positive status [ER+]: Secondary | ICD-10-CM | POA: Insufficient documentation

## 2021-07-01 DIAGNOSIS — Z510 Encounter for antineoplastic radiation therapy: Secondary | ICD-10-CM | POA: Diagnosis not present

## 2021-07-01 DIAGNOSIS — D0512 Intraductal carcinoma in situ of left breast: Secondary | ICD-10-CM | POA: Diagnosis not present

## 2021-07-01 DIAGNOSIS — D509 Iron deficiency anemia, unspecified: Secondary | ICD-10-CM | POA: Insufficient documentation

## 2021-07-01 DIAGNOSIS — Z923 Personal history of irradiation: Secondary | ICD-10-CM | POA: Insufficient documentation

## 2021-07-01 DIAGNOSIS — D0511 Intraductal carcinoma in situ of right breast: Secondary | ICD-10-CM | POA: Diagnosis not present

## 2021-07-02 ENCOUNTER — Encounter: Payer: Self-pay | Admitting: Rehabilitation

## 2021-07-02 ENCOUNTER — Ambulatory Visit
Admission: RE | Admit: 2021-07-02 | Discharge: 2021-07-02 | Disposition: A | Discharge status: Home / Self Care | Payer: BC Managed Care – PPO | Source: Ambulatory Visit | Attending: Radiation Oncology | Admitting: Radiation Oncology

## 2021-07-02 ENCOUNTER — Ambulatory Visit: Payer: BC Managed Care – PPO | Attending: Radiation Oncology | Admitting: Rehabilitation

## 2021-07-02 DIAGNOSIS — M25612 Stiffness of left shoulder, not elsewhere classified: Secondary | ICD-10-CM | POA: Insufficient documentation

## 2021-07-02 DIAGNOSIS — N6489 Other specified disorders of breast: Secondary | ICD-10-CM | POA: Diagnosis not present

## 2021-07-02 DIAGNOSIS — D0512 Intraductal carcinoma in situ of left breast: Secondary | ICD-10-CM | POA: Diagnosis not present

## 2021-07-02 DIAGNOSIS — M7981 Nontraumatic hematoma of soft tissue: Secondary | ICD-10-CM | POA: Diagnosis not present

## 2021-07-02 DIAGNOSIS — Z170 Estrogen receptor positive status [ER+]: Secondary | ICD-10-CM | POA: Diagnosis not present

## 2021-07-02 DIAGNOSIS — Z923 Personal history of irradiation: Secondary | ICD-10-CM | POA: Diagnosis not present

## 2021-07-02 DIAGNOSIS — Z510 Encounter for antineoplastic radiation therapy: Secondary | ICD-10-CM | POA: Diagnosis not present

## 2021-07-02 DIAGNOSIS — D0511 Intraductal carcinoma in situ of right breast: Secondary | ICD-10-CM | POA: Diagnosis not present

## 2021-07-02 DIAGNOSIS — D509 Iron deficiency anemia, unspecified: Secondary | ICD-10-CM | POA: Diagnosis not present

## 2021-07-02 NOTE — Therapy (Signed)
?OUTPATIENT PHYSICAL THERAPY ONCOLOGY EVALUATION ? ?Patient Name: Beth Stephens ?MRN: [id number] ?DOB:12/07/1975, 46 y.o., female ?Today's Date: 07/02/2021 ? ? PT End of Session - 07/02/21 1559   ? ? Visit Number 1   ? Number of Visits 6   ? Date for PT Re-Evaluation 08/13/21   ? Authorization Type BCBS auth unknown at eval   ? PT Start Time 1600   ? PT Stop Time 1655   ? PT Time Calculation (min) 55 min   ? Activity Tolerance Patient tolerated treatment well   ? Behavior During Therapy [facility] for tasks assessed/performed   ? ?  ?  ? ?  ? ? ?Past Medical History:  ?Diagnosis Date  ? Cancer [facility])   ? Breast Cancer  ? Colitis   ? Colitis   ? ?Past Surgical History:  ?Procedure Laterality Date  ? BREAST LUMPECTOMY WITH RADIOACTIVE SEED LOCALIZATION Left 05/01/2021  ? Procedure: LEFT BREAST LUMPECTOMY WITH RADIOACTIVE SEED LOCALIZATION;  Surgeon: Stark Klein, MD;  Location: [place];  Service: General;  Laterality: Left;  ? COLONOSCOPY    ? WISDOM TOOTH EXTRACTION    ? ?Patient Active Problem List  ? Diagnosis Date Noted  ? Iron deficiency anemia due to chronic blood loss 05/24/2021  ? Genetic testing 04/22/2021  ? Ductal carcinoma in situ (DCIS) of left breast 04/08/2021  ? ? ?PCP: Pcp, No ? ?REFERRING PROVIDER: Eppie Gibson, MD ? ?REFERRING DIAG: D05.12 (ICD-10-CM) - Ductal carcinoma in situ (DCIS) of left breast ? ?THERAPY DIAG:  ?Ductal carcinoma in situ (DCIS) of left breast ? ?Nontraumatic hematoma of soft tissue ? ?Breast edema ? ?Stiffness of left shoulder, not elsewhere classified ? ?ONSET DATE: 05/29/21 ? ?SUBJECTIVE                                                                                                                                                                                          ? ?SUBJECTIVE STATEMENT: ?It is more stiffness in the shoulder.  The position of the radiation can be bad.  The pain is in back/top of the shoulder with certain movements  ? ?PERTINENT HISTORY:  ?Left lumpectomy 05/01/21 no  SLNB with Dr. Barry Dienes.  Large hematoma formation. Radiation started 06/24/21. .  ? ?PAIN:  ?Are you having pain? No ? ?PRECAUTIONS: Other: very small lymphedema risk Lt UE and breast due to radiation ? ?WEIGHT BEARING RESTRICTIONS No ? ?FALLS:  ?Has patient fallen in last 6 months? No ? ?LIVING ENVIRONMENT: ?Lives with: lives with their family ?Lives in: House/apartment ? ?OCCUPATION: endocrinologist  ? ?LEISURE: yoga, stretching, walking ? ?HAND DOMINANCE : right  ? ?PRIOR LEVEL OF  FUNCTION: Independent ? ?PATIENT GOALS what do I need to do to get rid of the shoulder pain. ? ? ?OBJECTIVE ? ?COGNITION: ? Overall cognitive status: Within functional limits for tasks assessed  ? ?PALPATION: Very hard mandarin orange size hematoma under lateral breast incision, +2 ttp axillary palpation with some puffiness here.  Overall breast not swollen.  ? ?OBSERVATIONS / OTHER ASSESSMENTS: healing hematoma Lt lateral breast, puffiness Lt axilla, wearing soft cotton eye clasp front bra which has no compression but pt reports is comfortable and the only fabric that is okay to wear currently.  ? ?POSTURE: forward shoulders ? ?UPPER EXTREMITY AROM/PROM: ? ?A/PROM RIGHT  07/02/2021 ?  ?Shoulder extension 53  ?Shoulder flexion 174  ?Shoulder abduction 165  ?Shoulder internal rotation Behind the back to T6  ?Shoulder external rotation 90  ?  (Blank rows = not tested) ? ?A/PROM LEFT  07/02/2021  ?Shoulder extension 54  ?Shoulder flexion 165  ?Shoulder abduction 160  ?Shoulder internal rotation Behind the back to T4  ?Shoulder external rotation 90  ?  (Blank rows = not tested) ? ? ?CERVICAL AROM: ?All within normal limits:  ? ? ?TODAY'S TREATMENT  ?Review of compression bra options and how it is okay to wear a compression bra during radiation until it is not comfortable - gave pt script for bra and information for second to nature.  Also gave ordering information on [employer] for kimbee swell spot for lateral bra use to soften hematoma. ?Education  on self MLD strokes towards the Lt axilla as pt had no lymph nodes removed ?Performance of HEP with email handout - did not get code copied ? Supine bil shoulder extension  press into mat 5" x 6 ? Bil protraction/retraction x 10 ? Alternating shoulder flexion with focus on setting shoulder blade to decrease impingement ? Seated retraction ? Mermaid stretch ?  ? ?PATIENT EDUCATION:  ?Education details: per above ?Person educated: Patient ?Education method: Explanation, Demonstration, Tactile cues, Verbal cues, and Handouts ?Education comprehension: verbalized understanding, returned demonstration, verbal cues required, and needs further education ? ? ?HOME EXERCISE PROGRAM: ?Supine bil shoulder extension  press into mat 5" x 6 ? Bil protraction/retraction x 10 ? Alternating shoulder flexion with focus on setting shoulder blade to decrease impingement ? Seated retraction ? Mermaid stretch ? ?ASSESSMENT: ? ?CLINICAL IMPRESSION: ?Patient is a 46 y.o. female who was seen today for physical therapy evaluation and treatment for decreased shoulder ROM, skin sensitivity, increased edema, and hematoma healing post lumpectomy without SLNB. Pt has started her 3 weeks of radiation and is tolerating this well except for pain with positioning.  Pt has firmness under lumpectomy incision and pull into the scar/breast with overhead flexion.  Pt will return to check on progress with self HEP and self care strategies.   ? ? ?OBJECTIVE IMPAIRMENTS decreased knowledge of use of DME, decreased ROM, and increased edema.  ? ?ACTIVITY LIMITATIONS community activity and yard work.  ? ?PERSONAL FACTORS 1 comorbidity: hematoma complication  are also affecting patient's functional outcome.  ? ? ?REHAB POTENTIAL: Excellent ? ?CLINICAL DECISION MAKING: Stable/uncomplicated ? ?EVALUATION COMPLEXITY: Low ? ?GOALS: ?Goals reviewed with patient? Yes ? ? ?LONG TERM GOALS: Target date: 08/13/2021 ? ?Pt will improve Lt UE AROM to equal to the Rt UE and  without pain ?Baseline:  ?Goal status: INITIAL ? ?2.  Pt will be ind with self care regarding Lt breast hematoma including self massage, compression bra, and use of foam or swell spot ?Baseline:  ?Goal  status: INITIAL ? ?PLAN: ?PT FREQUENCY: 1x/week ? ?PT DURATION: 6 weeks ? ?PLANNED INTERVENTIONS: Therapeutic exercises, Patient/Family education, Joint mobilization, DME instructions, Manual lymph drainage, Compression bandaging, scar mobilization, Taping, and Manual therapy ? ?PLAN FOR NEXT SESSION: status check. How is hematoma and AROM? What are needs? ? ? ?Stark Bray, PT ?07/02/2021, 9:19 PM ? ?

## 2021-07-03 ENCOUNTER — Ambulatory Visit
Admission: RE | Admit: 2021-07-03 | Discharge: 2021-07-03 | Disposition: A | Discharge status: Home / Self Care | Payer: BC Managed Care – PPO | Source: Ambulatory Visit | Attending: Radiation Oncology | Admitting: Radiation Oncology

## 2021-07-03 ENCOUNTER — Other Ambulatory Visit: Payer: Self-pay

## 2021-07-03 DIAGNOSIS — Z510 Encounter for antineoplastic radiation therapy: Secondary | ICD-10-CM | POA: Diagnosis not present

## 2021-07-03 DIAGNOSIS — D0512 Intraductal carcinoma in situ of left breast: Secondary | ICD-10-CM | POA: Diagnosis not present

## 2021-07-03 DIAGNOSIS — D509 Iron deficiency anemia, unspecified: Secondary | ICD-10-CM | POA: Diagnosis not present

## 2021-07-03 DIAGNOSIS — Z923 Personal history of irradiation: Secondary | ICD-10-CM | POA: Diagnosis not present

## 2021-07-03 DIAGNOSIS — Z170 Estrogen receptor positive status [ER+]: Secondary | ICD-10-CM | POA: Diagnosis not present

## 2021-07-03 DIAGNOSIS — D0511 Intraductal carcinoma in situ of right breast: Secondary | ICD-10-CM | POA: Diagnosis not present

## 2021-07-04 ENCOUNTER — Ambulatory Visit
Admission: RE | Admit: 2021-07-04 | Discharge: 2021-07-04 | Disposition: A | Discharge status: Home / Self Care | Payer: BC Managed Care – PPO | Source: Ambulatory Visit | Attending: Radiation Oncology | Admitting: Radiation Oncology

## 2021-07-04 DIAGNOSIS — Z510 Encounter for antineoplastic radiation therapy: Secondary | ICD-10-CM | POA: Diagnosis not present

## 2021-07-04 DIAGNOSIS — D0512 Intraductal carcinoma in situ of left breast: Secondary | ICD-10-CM | POA: Diagnosis not present

## 2021-07-04 DIAGNOSIS — D509 Iron deficiency anemia, unspecified: Secondary | ICD-10-CM | POA: Diagnosis not present

## 2021-07-04 DIAGNOSIS — Z170 Estrogen receptor positive status [ER+]: Secondary | ICD-10-CM | POA: Diagnosis not present

## 2021-07-04 DIAGNOSIS — Z923 Personal history of irradiation: Secondary | ICD-10-CM | POA: Diagnosis not present

## 2021-07-04 DIAGNOSIS — D0511 Intraductal carcinoma in situ of right breast: Secondary | ICD-10-CM | POA: Diagnosis not present

## 2021-07-05 ENCOUNTER — Ambulatory Visit
Admission: RE | Admit: 2021-07-05 | Discharge: 2021-07-05 | Disposition: A | Discharge status: Home / Self Care | Payer: BC Managed Care – PPO | Source: Ambulatory Visit | Attending: Radiation Oncology | Admitting: Radiation Oncology

## 2021-07-05 DIAGNOSIS — D509 Iron deficiency anemia, unspecified: Secondary | ICD-10-CM | POA: Diagnosis not present

## 2021-07-05 DIAGNOSIS — Z923 Personal history of irradiation: Secondary | ICD-10-CM | POA: Diagnosis not present

## 2021-07-05 DIAGNOSIS — Z170 Estrogen receptor positive status [ER+]: Secondary | ICD-10-CM | POA: Diagnosis not present

## 2021-07-05 DIAGNOSIS — Z510 Encounter for antineoplastic radiation therapy: Secondary | ICD-10-CM | POA: Diagnosis not present

## 2021-07-05 DIAGNOSIS — D0511 Intraductal carcinoma in situ of right breast: Secondary | ICD-10-CM | POA: Diagnosis not present

## 2021-07-05 DIAGNOSIS — D0512 Intraductal carcinoma in situ of left breast: Secondary | ICD-10-CM | POA: Diagnosis not present

## 2021-07-08 ENCOUNTER — Ambulatory Visit
Admission: RE | Admit: 2021-07-08 | Discharge: 2021-07-08 | Disposition: A | Discharge status: Home / Self Care | Payer: BC Managed Care – PPO | Source: Ambulatory Visit | Attending: Radiation Oncology | Admitting: Radiation Oncology

## 2021-07-08 ENCOUNTER — Ambulatory Visit: Payer: BC Managed Care – PPO | Admitting: Radiation Oncology

## 2021-07-08 ENCOUNTER — Other Ambulatory Visit: Payer: Self-pay

## 2021-07-08 DIAGNOSIS — D0511 Intraductal carcinoma in situ of right breast: Secondary | ICD-10-CM | POA: Diagnosis not present

## 2021-07-08 DIAGNOSIS — Z923 Personal history of irradiation: Secondary | ICD-10-CM | POA: Diagnosis not present

## 2021-07-08 DIAGNOSIS — Z510 Encounter for antineoplastic radiation therapy: Secondary | ICD-10-CM | POA: Diagnosis not present

## 2021-07-08 DIAGNOSIS — Z170 Estrogen receptor positive status [ER+]: Secondary | ICD-10-CM | POA: Diagnosis not present

## 2021-07-08 DIAGNOSIS — D509 Iron deficiency anemia, unspecified: Secondary | ICD-10-CM | POA: Diagnosis not present

## 2021-07-08 DIAGNOSIS — D0512 Intraductal carcinoma in situ of left breast: Secondary | ICD-10-CM | POA: Diagnosis not present

## 2021-07-09 ENCOUNTER — Ambulatory Visit
Admission: RE | Admit: 2021-07-09 | Discharge: 2021-07-09 | Disposition: A | Discharge status: Home / Self Care | Payer: BC Managed Care – PPO | Source: Ambulatory Visit | Attending: Radiation Oncology | Admitting: Radiation Oncology

## 2021-07-09 DIAGNOSIS — D509 Iron deficiency anemia, unspecified: Secondary | ICD-10-CM | POA: Diagnosis not present

## 2021-07-09 DIAGNOSIS — Z510 Encounter for antineoplastic radiation therapy: Secondary | ICD-10-CM | POA: Diagnosis not present

## 2021-07-09 DIAGNOSIS — Z923 Personal history of irradiation: Secondary | ICD-10-CM | POA: Diagnosis not present

## 2021-07-09 DIAGNOSIS — D0511 Intraductal carcinoma in situ of right breast: Secondary | ICD-10-CM | POA: Diagnosis not present

## 2021-07-09 DIAGNOSIS — D0512 Intraductal carcinoma in situ of left breast: Secondary | ICD-10-CM | POA: Diagnosis not present

## 2021-07-09 DIAGNOSIS — Z170 Estrogen receptor positive status [ER+]: Secondary | ICD-10-CM | POA: Diagnosis not present

## 2021-07-10 ENCOUNTER — Ambulatory Visit
Admission: RE | Admit: 2021-07-10 | Discharge: 2021-07-10 | Disposition: A | Discharge status: Home / Self Care | Payer: BC Managed Care – PPO | Source: Ambulatory Visit | Attending: Radiation Oncology | Admitting: Radiation Oncology

## 2021-07-10 ENCOUNTER — Ambulatory Visit: Payer: BC Managed Care – PPO | Admitting: Hematology

## 2021-07-10 ENCOUNTER — Other Ambulatory Visit: Payer: Self-pay

## 2021-07-10 DIAGNOSIS — D0511 Intraductal carcinoma in situ of right breast: Secondary | ICD-10-CM | POA: Diagnosis not present

## 2021-07-10 DIAGNOSIS — Z510 Encounter for antineoplastic radiation therapy: Secondary | ICD-10-CM | POA: Diagnosis not present

## 2021-07-10 DIAGNOSIS — Z923 Personal history of irradiation: Secondary | ICD-10-CM | POA: Diagnosis not present

## 2021-07-10 DIAGNOSIS — D509 Iron deficiency anemia, unspecified: Secondary | ICD-10-CM | POA: Diagnosis not present

## 2021-07-10 DIAGNOSIS — D0512 Intraductal carcinoma in situ of left breast: Secondary | ICD-10-CM | POA: Diagnosis not present

## 2021-07-10 DIAGNOSIS — Z170 Estrogen receptor positive status [ER+]: Secondary | ICD-10-CM | POA: Diagnosis not present

## 2021-07-11 ENCOUNTER — Ambulatory Visit
Admission: RE | Admit: 2021-07-11 | Discharge: 2021-07-11 | Disposition: A | Discharge status: Home / Self Care | Payer: BC Managed Care – PPO | Source: Ambulatory Visit | Attending: Radiation Oncology | Admitting: Radiation Oncology

## 2021-07-11 DIAGNOSIS — D509 Iron deficiency anemia, unspecified: Secondary | ICD-10-CM | POA: Diagnosis not present

## 2021-07-11 DIAGNOSIS — Z923 Personal history of irradiation: Secondary | ICD-10-CM | POA: Diagnosis not present

## 2021-07-11 DIAGNOSIS — Z170 Estrogen receptor positive status [ER+]: Secondary | ICD-10-CM | POA: Diagnosis not present

## 2021-07-11 DIAGNOSIS — D0511 Intraductal carcinoma in situ of right breast: Secondary | ICD-10-CM | POA: Diagnosis not present

## 2021-07-11 DIAGNOSIS — D0512 Intraductal carcinoma in situ of left breast: Secondary | ICD-10-CM | POA: Diagnosis not present

## 2021-07-11 DIAGNOSIS — Z510 Encounter for antineoplastic radiation therapy: Secondary | ICD-10-CM | POA: Diagnosis not present

## 2021-07-12 ENCOUNTER — Other Ambulatory Visit: Payer: Self-pay

## 2021-07-12 ENCOUNTER — Ambulatory Visit
Admission: RE | Admit: 2021-07-12 | Discharge: 2021-07-12 | Disposition: A | Discharge status: Home / Self Care | Payer: BC Managed Care – PPO | Source: Ambulatory Visit | Attending: Radiation Oncology | Admitting: Radiation Oncology

## 2021-07-12 ENCOUNTER — Inpatient Hospital Stay (HOSPITAL_BASED_OUTPATIENT_CLINIC_OR_DEPARTMENT_OTHER): Payer: BC Managed Care – PPO | Admitting: Hematology

## 2021-07-12 VITALS — BP 127/73 | HR 87 | Temp 98.4°F | Resp 18 | Ht 63.0 in | Wt 117.4 lb

## 2021-07-12 DIAGNOSIS — Z170 Estrogen receptor positive status [ER+]: Secondary | ICD-10-CM | POA: Insufficient documentation

## 2021-07-12 DIAGNOSIS — Z8742 Personal history of other diseases of the female genital tract: Secondary | ICD-10-CM | POA: Diagnosis not present

## 2021-07-12 DIAGNOSIS — D0511 Intraductal carcinoma in situ of right breast: Secondary | ICD-10-CM | POA: Diagnosis not present

## 2021-07-12 DIAGNOSIS — D509 Iron deficiency anemia, unspecified: Secondary | ICD-10-CM | POA: Insufficient documentation

## 2021-07-12 DIAGNOSIS — Z923 Personal history of irradiation: Secondary | ICD-10-CM | POA: Insufficient documentation

## 2021-07-12 DIAGNOSIS — S2000XA Contusion of breast, unspecified breast, initial encounter: Secondary | ICD-10-CM

## 2021-07-12 DIAGNOSIS — D0512 Intraductal carcinoma in situ of left breast: Secondary | ICD-10-CM | POA: Insufficient documentation

## 2021-07-12 DIAGNOSIS — Z510 Encounter for antineoplastic radiation therapy: Secondary | ICD-10-CM | POA: Diagnosis not present

## 2021-07-12 MED ORDER — TAMOXIFEN CITRATE 10 MG PO TABS: 10.0000 mg | ORAL_TABLET | Freq: Every day | ORAL | 1 refills | Status: DC

## 2021-07-12 NOTE — Progress Notes (Signed)
?[facility]   ?[phone] [phone]   ?Clinic Follow up Note  ? ?Patient Care Team: ?Pcp, No as PCP - General ?Mauro Kaufmann, RN as Oncology Nurse Navigator ?Rockwell Germany, RN as Oncology Nurse Navigator ? ?Date of Service:  07/12/2021 ? ?CHIEF COMPLAINT: f/u of left breast DCIS ? ?CURRENT THERAPY:  ?Adjuvant radiation 3/27-4/17/23 ?To start tamoxifen 07/2021 ? ?ASSESSMENT & PLAN:  ?Beth Stephens is a 46 y.o. pre-menopausal female with  ? ?1. Left breast DCIS, grade 2, ER+/PR+ ?-found on screening mammogram. Biopsy on 04/02/21 confirmed intermediate grade DCIS. ?-genetic testing was negative. ?-left lumpectomy on 05/01/21 by Dr. Barry Dienes showed 1 cm DCIS, margins negative.  ?-Postop course was complicated by a large hematoma requiring drainage. ?-she began radiation on 06/24/21 under Dr. Isidore Moos, scheduled to finish on 07/15/21. ?--Giving her strongly ER and PR positivity of the tumor cells and her pre-menopause status, she would benefit from adjuvant endocrine therapy with tamoxifen to reduce her risk of future breast cancer. The potential side effects, which includes but not limited to, hot flash, skin and vaginal dryness, slightly increased risk of cardiovascular disease and cataract, small risk of thrombosis and endometrial cancer, were discussed with her in great details. Preventive strategies for thrombosis, such as being physically active, using compression stocks, avoid cigarette smoking, etc., were reviewed with her. I reviewed that this is preventative. She voiced good understanding, and agrees to proceed. Will start after she completes adjuvant breast radiation. ?-We discussed the data of low-dose tamoxifen in DCIS, I recommend 10 mg daily for 3 to 5 years. ?  ?2.  Symptomatic anemia from bleeding and mild iron deficiency, dyspnea on exertion  ?She had extensive dental bleeding after tooth extraction when she was young, and easy bruising of her whole life, no family  history of bleeding disorder. Work up was normal. ?-she developed symptomatic anemia with dyspnea on exertion following surgery, hgb 10.5. ?-she started oral iron in early 05/2021 and was given IV Venofer on 05/24/21 and 05/29/21 ? ?3.  Postsurgical bleeding, ruled out bleeding disorder ?-She has had easy bruising for a long time. She developed a large hematoma after left breast lumpectomy, required multiple draining ?-I reviewed her lab work, which showed normal PT, APTT, and platelet count.  Platelet function assay and aggregation were normal, Von willebrand panel was normal, I reviewed the above screening test results with her. ?-We will test Factor 8, 9, 11 and 13 to rule out mild hemophilia which could have normal aPTT  ?-will refer her to [facility] Dr. Gaylyn Cheers for further evaluation  ? ?  ?PLAN:  ?-complete radiation on 07/15/21 ?-pelvic US per pt request ?-start tamoxifen $RemoveBeforeDEI'10mg'gZOofjirpYTrQywQ$  daily in 07/2021 ?-lab and survivorship in 3 months ?-lab and f/u in 6 months ?-lab next week ?-referral to [facility] Dr. Gaylyn Cheers  ? ? ?No problem-specific Assessment & Plan notes found for this encounter. ? ? ?SUMMARY OF ONCOLOGIC HISTORY: ?Oncology History Overview Note  ? Cancer Staging  ?Ductal carcinoma in situ (DCIS) of left breast ?Staging form: Breast, AJCC 8th Edition ?- Clinical stage from 04/02/2021: Stage 0 (cTis (DCIS), cN0, cM0, G3, ER+, PR+, HER2: Not Assessed) - Signed by Truitt Merle, MD on 04/09/2021 ?- Pathologic stage from 05/01/2021: Stage Unknown (pTis (DCIS), pNX, cM0, G2, ER+, PR+, HER2: Not Assessed) - Signed by Truitt Merle, MD on 05/23/2021 ? ?  ?Ductal carcinoma in situ (DCIS) of left breast  ?01/18/2021 Mammogram  ? Exam: 3D Mammogram Diagnostic Digital - L ? ?  IMPRESSION: ?The 0.3 cm grouped round calcifications in the left breast are suspicious. ?  ?04/02/2021 Initial Biopsy  ? Diagnosis ?1. Breast, left, needle core biopsy, left breast calcification @ 3:00 6 cmfn ? - DUCTAL CARCINOMA IN SITU WITH NECROSIS AND CALCIFICATIONS ? - SEE  COMMENT ?Microscopic Comment ?Immunohistochemistry was performed on the biopsy to assess for an invasive component (SMM, calponin, CD34 and p63). Myoepithelial cells are present supporting the diagnosis of ductal carcinoma in situ. Based on the biopsy, the ductal carcinoma in situ cribriform and solid patterns, intermediate nuclear grade, and measures 0.6 cm in greatest linear extent. ? ?1. PROGNOSTIC INDICATORS ?Results: ?Estrogen Receptor: 100%, POSITIVE, STRONG STAINING INTENSITY ?Progesterone Receptor: 70%, POSITIVE, STRONG STAINING INTENSITY ?  ?04/02/2021 Cancer Staging  ? Staging form: Breast, AJCC 8th Edition ?- Clinical stage from 04/02/2021: Stage 0 (cTis (DCIS), cN0, cM0, G3, ER+, PR+, HER2: Not Assessed) - Signed by Truitt Merle, MD on 04/09/2021 ?Stage prefix: Initial diagnosis ?Histologic grading system: 3 grade system ? ?  ?04/08/2021 Initial Diagnosis  ? Ductal carcinoma in situ (DCIS) of left breast ?  ? Genetic Testing  ? [facility] CancerNext-Expanded Panel was Negative. Report date is 04/20/2021. ? ?The CancerNext-Expanded gene panel offered by [facility] and includes sequencing, rearrangement, and RNA analysis for the following 77 genes: AIP, ALK, APC, ATM, AXIN2, BAP1, BARD1, BLM, BMPR1A, BRCA1, BRCA2, BRIP1, CDC73, CDH1, CDK4, CDKN1B, CDKN2A, CHEK2, CTNNA1, DICER1, FANCC, FH, FLCN, GALNT12, KIF1B, LZTR1, MAX, MEN1, MET, MLH1, MSH2, MSH3, MSH6, MUTYH, NBN, NF1, NF2, NTHL1, PALB2, PHOX2B, PMS2, POT1, PRKAR1A, PTCH1, PTEN, RAD51C, RAD51D, RB1, RECQL, RET, SDHA, SDHAF2, SDHB, SDHC, SDHD, SMAD4, SMARCA4, SMARCB1, SMARCE1, STK11, SUFU, TMEM127, TP53, TSC1, TSC2, VHL and XRCC2 (sequencing and deletion/duplication); EGFR, EGLN1, HOXB13, KIT, MITF, PDGFRA, POLD1, and POLE (sequencing only); EPCAM and GREM1 (deletion/duplication only).  ?  ?04/26/2021 Imaging  ? EXAM: ?BILATERAL BREAST MRI WITH AND WITHOUT CONTRAST ? ?IMPRESSION: ?1. No significant enhancement at the site of known ductal carcinoma in situ in the  LATERAL portion of the LEFT breast. There is minimal enhancement along the biopsy tract in this region. ?2. 0.8 centimeter indeterminate enhancing mass in the posterior ?LOWER INNER QUADRANT of the LEFT breast. ?3. RIGHT breast is negative. ?  ?05/01/2021 Cancer Staging  ? Staging form: Breast, AJCC 8th Edition ?- Pathologic stage from 05/01/2021: Stage Unknown (pTis (DCIS), pNX, cM0, G2, ER+, PR+, HER2: Not Assessed) - Signed by Truitt Merle, MD on 05/23/2021 ?Stage prefix: Initial diagnosis ?Histologic grading system: 3 grade system ?Residual tumor (R): R0 - None ? ?  ?05/01/2021 Pathology Results  ? FINAL MICROSCOPIC DIAGNOSIS:  ? ?A. BREAST, LEFT MEDIAL @ 7:30, EXCISION:  ?- Fibrocystic changes with calcifications.  ?- No malignancy identified.  ? ?B. BREAST, LEFT @ 3;00, LUMPECTOMY:  ?- Ductal carcinoma in situ, 1 cm.  ?- All surgical margins negative for DCIS.  ?- Fibrocystic changes with diffuse sclerosing adenosis and  ?calcifications.  ?- Biopsy site and biopsy clip.  ?- See oncology table.  ?  ? ? ? ?INTERVAL HISTORY:  ?Beth Stephens is here for a follow up of breast cancer. She was last seen by me on 05/24/21. She presents to the clinic alone. ?She reports she is tolerating radiation well with some burning. She also reports the IV iron was very helpful for her. ?  ?All other systems were reviewed with the patient and are negative. ? ?MEDICAL HISTORY:  ?Past Medical History:  ?Diagnosis Date  ? Cancer [facility])   ? Breast  Cancer  ? Colitis   ? Colitis   ? ? ?SURGICAL HISTORY: ?Past Surgical History:  ?Procedure Laterality Date  ? BREAST LUMPECTOMY WITH RADIOACTIVE SEED LOCALIZATION Left 05/01/2021  ? Procedure: LEFT BREAST LUMPECTOMY WITH RADIOACTIVE SEED LOCALIZATION;  Surgeon: Stark Klein, MD;  Location: [place];  Service: General;  Laterality: Left;  ? COLONOSCOPY    ? WISDOM TOOTH EXTRACTION    ? ? ?I have reviewed the social history and family history with the patient and they are unchanged from previous  note. ? ?ALLERGIES:  is allergic to gluten meal and sumatriptan. ? ?MEDICATIONS:  ?Current Outpatient Medications  ?Medication Sig Dispense Refill  ? sulfaSALAzine (AZULFIDINE) 500 MG tablet Take 1,000 mg by mouth See admin instruc

## 2021-07-13 ENCOUNTER — Encounter: Payer: Self-pay | Admitting: Hematology

## 2021-07-15 ENCOUNTER — Ambulatory Visit
Admission: RE | Admit: 2021-07-15 | Discharge: 2021-07-15 | Disposition: A | Discharge status: Home / Self Care | Payer: BC Managed Care – PPO | Source: Ambulatory Visit | Attending: Radiation Oncology | Admitting: Radiation Oncology

## 2021-07-15 ENCOUNTER — Other Ambulatory Visit: Payer: Self-pay

## 2021-07-15 ENCOUNTER — Telehealth: Payer: Self-pay | Admitting: Hematology

## 2021-07-15 ENCOUNTER — Ambulatory Visit: Payer: BC Managed Care – PPO

## 2021-07-15 ENCOUNTER — Encounter: Payer: Self-pay | Admitting: *Deleted

## 2021-07-15 ENCOUNTER — Encounter: Payer: Self-pay | Admitting: Radiation Oncology

## 2021-07-15 DIAGNOSIS — Z923 Personal history of irradiation: Secondary | ICD-10-CM | POA: Diagnosis not present

## 2021-07-15 DIAGNOSIS — D0512 Intraductal carcinoma in situ of left breast: Secondary | ICD-10-CM

## 2021-07-15 DIAGNOSIS — D0511 Intraductal carcinoma in situ of right breast: Secondary | ICD-10-CM | POA: Diagnosis not present

## 2021-07-15 DIAGNOSIS — Z170 Estrogen receptor positive status [ER+]: Secondary | ICD-10-CM | POA: Diagnosis not present

## 2021-07-15 DIAGNOSIS — Z510 Encounter for antineoplastic radiation therapy: Secondary | ICD-10-CM | POA: Diagnosis not present

## 2021-07-15 DIAGNOSIS — D509 Iron deficiency anemia, unspecified: Secondary | ICD-10-CM | POA: Diagnosis not present

## 2021-07-15 NOTE — Telephone Encounter (Signed)
Left message with follow-up appointments per 4/14 los. ?

## 2021-07-16 ENCOUNTER — Ambulatory Visit: Payer: BC Managed Care – PPO

## 2021-07-17 ENCOUNTER — Ambulatory Visit: Payer: BC Managed Care – PPO

## 2021-07-18 ENCOUNTER — Ambulatory Visit: Payer: BC Managed Care – PPO

## 2021-07-19 ENCOUNTER — Ambulatory Visit: Payer: BC Managed Care – PPO

## 2021-07-19 ENCOUNTER — Inpatient Hospital Stay: Payer: BC Managed Care – PPO

## 2021-07-22 ENCOUNTER — Encounter: Payer: Self-pay | Admitting: Rehabilitation

## 2021-07-22 ENCOUNTER — Ambulatory Visit: Payer: BC Managed Care – PPO | Admitting: Rehabilitation

## 2021-07-22 ENCOUNTER — Inpatient Hospital Stay: Payer: BC Managed Care – PPO

## 2021-07-22 DIAGNOSIS — D0512 Intraductal carcinoma in situ of left breast: Secondary | ICD-10-CM

## 2021-07-22 DIAGNOSIS — M25612 Stiffness of left shoulder, not elsewhere classified: Secondary | ICD-10-CM | POA: Diagnosis not present

## 2021-07-22 DIAGNOSIS — M7981 Nontraumatic hematoma of soft tissue: Secondary | ICD-10-CM

## 2021-07-22 DIAGNOSIS — N6489 Other specified disorders of breast: Secondary | ICD-10-CM

## 2021-07-22 NOTE — Therapy (Signed)
?OUTPATIENT PHYSICAL THERAPY TREATMENT NOTE ? ? ?Patient Name: Beth Stephens ?MRN: [id number] ?DOB:July 23, 1975, 46 y.o., female ?Today's Date: 07/22/2021 ? ?PCP: Pcp, No ?REFERRING PROVIDER: Eppie Gibson, MD ? ?END OF SESSION:  ? PT End of Session - 07/22/21 2301   ? ? Visit Number 2   ? Number of Visits 6   ? Date for PT Re-Evaluation 08/13/21   ? PT Start Time 1608   ? PT Stop Time 1937   ? PT Time Calculation (min) 42 min   ? Activity Tolerance Patient tolerated treatment well   ? Behavior During Therapy [facility] for tasks assessed/performed   ? ?  ?  ? ?  ? ? ?Past Medical History:  ?Diagnosis Date  ? Cancer [facility])   ? Breast Cancer  ? Colitis   ? Colitis   ? ?Past Surgical History:  ?Procedure Laterality Date  ? BREAST LUMPECTOMY WITH RADIOACTIVE SEED LOCALIZATION Left 05/01/2021  ? Procedure: LEFT BREAST LUMPECTOMY WITH RADIOACTIVE SEED LOCALIZATION;  Surgeon: Stark Klein, MD;  Location: [place];  Service: General;  Laterality: Left;  ? COLONOSCOPY    ? WISDOM TOOTH EXTRACTION    ? ?Patient Active Problem List  ? Diagnosis Date Noted  ? Iron deficiency anemia due to chronic blood loss 05/24/2021  ? Genetic testing 04/22/2021  ? Ductal carcinoma in situ (DCIS) of left breast 04/08/2021  ? ? ?REFERRING DIAG: DCIS Lt breast ? ?THERAPY DIAG:  ?Ductal carcinoma in situ (DCIS) of left breast ? ?Nontraumatic hematoma of soft tissue ? ?Breast edema ? ?Stiffness of left shoulder, not elsewhere classified ? ?PERTINENT HISTORY: Left lumpectomy 05/01/21 no SLNB with Dr. Barry Dienes.  Large hematoma formation. Radiation completed ? ?PRECAUTIONS: Other: very small lymphedema risk Lt UE and breast due to radiation ? ?SUBJECTIVE: The skin is just peeling.  I have not been able to get a bra or insert mainly because the skin got irritated. The shoulder is not good.  I think it got a bit worse. I keep turning on it.  ? ?PAIN:  ?Are you having pain? Yes: NPRS scale: 0/10 - it is up to 7/10 pinch when I lift overhead ?Pain location: anterior  shoulder ?Pain description: sharp pinch ?Aggravating factors: reaching overhead and rolling on it ?Relieving factors: rest ? ? ?OBJECTIVE:  ?PALPATION: Very hard mandarin orange size hematoma under lateral breast incision, +2 ttp axillary palpation with some puffiness here.  Overall breast not swollen.  ?  ? OBSERVATIONS / OTHER ASSESSMENTS: 07/22/21: radiated field still red and healing especially inferior to breast, and peeling blister area in the axilla.        07/02/21: healing hematoma Lt lateral breast, puffiness Lt axilla, wearing soft cotton eye clasp front bra which has no compression but pt reports is comfortable and the only fabric that is okay to wear currently.  ?  ?UPPER EXTREMITY AROM/PROM: ?  ?A/PROM RIGHT  07/02/2021 ?    ?Shoulder extension 53   ?Shoulder flexion 174   ?Shoulder abduction 165   ?Shoulder internal rotation Behind the back to T6   ?Shoulder external rotation 90   ?                        (Blank rows = not tested) ?  ?A/PROM LEFT  07/02/2021 07/22/21  ?Shoulder extension 54   ?Shoulder flexion 165 145 - ache top of shoulder   ?Shoulder abduction 160 155 - ache top of the shoulder   ?Shoulder internal rotation  Behind the back to T4   ?Shoulder external rotation 90   ?                        (Blank rows = not tested) ?  ?TODAY'S TREATMENT  ?07/22/21: ?STM Lt UT, anterior shoulder, and latissimus avoiding radiated area with trigger point release as needed in UT ?PROM to tolerance into flexion ?[place] AP grade IV- 4x20" ?Sidelying STM Lt scapular region without much tension and no pain ? ? ?07/02/21:Review of compression bra options and how it is okay to wear a compression bra during radiation until it is not comfortable - gave pt script for bra and information for second to nature.  Also gave ordering information on [employer] for kimbee swell spot for lateral bra use to soften hematoma. ?Education on self MLD strokes towards the Lt axilla as pt had no lymph nodes removed ?Performance of HEP with email  handout - did not get code copied ?   Supine bil shoulder extension  press into mat 5" x 6 ?   Bil protraction/retraction x 10 ?   Alternating shoulder flexion with focus on setting shoulder blade to decrease impingement ?   Seated retraction ?   Mermaid stretch ?            ?  ?PATIENT EDUCATION:  ?Education details: per above ?Person educated: Patient ?Education method: Explanation, Demonstration, Tactile cues, Verbal cues, and Handouts ?Education comprehension: verbalized understanding, returned demonstration, verbal cues required, and needs further education ?  ?  ?HOME EXERCISE PROGRAM: ?Supine bil shoulder extension  press into mat 5" x 6 ?            Bil protraction/retraction x 10 ?            Alternating shoulder flexion with focus on setting shoulder blade to decrease impingement ?            Seated retraction ?            Mermaid stretch ?  ?ASSESSMENT: ?  ?CLINICAL IMPRESSION: ?Pt returns now post radiation with continued shoulder pain, hard lateral breast hematoma, and decrease ROM.  Skin is almost healed but was not quite healed as of today so radiated area was avoided.  Pt has lost some AROM mainly into flexion and abduction.  Pt will start more PT visits at this time to work on decreasing shoulder pain and to manage Lt breast fibrosis.   ?  ?OBJECTIVE IMPAIRMENTS decreased knowledge of use of DME, decreased ROM, and increased edema.  ?  ?ACTIVITY LIMITATIONS community activity and yard work.  ?  ?PERSONAL FACTORS 1 comorbidity: hematoma complication  are also affecting patient's functional outcome.  ?  ?  ?REHAB POTENTIAL: Excellent ?  ?CLINICAL DECISION MAKING: Stable/uncomplicated ?  ?EVALUATION COMPLEXITY: Low ? ?GOALS: ?Goals reviewed with patient? Yes ?  ?  ?LONG TERM GOALS: Target date: 08/13/2021 ?  ?Pt will improve Lt UE AROM to equal to the Rt UE and without pain ?Baseline:  ?Goal status: INITIAL ?  ?2.  Pt will be ind with self care regarding Lt breast hematoma including self massage,  compression bra, and use of foam or swell spot ?Baseline:  ?Goal status: INITIAL ?  ?PLAN: ?PT FREQUENCY: 2x/week ?  ?PT DURATION: 4weeks ?  ?PLANNED INTERVENTIONS: Therapeutic exercises, Patient/Family education, Joint mobilization, DME instructions, Manual lymph drainage, Compression bandaging, scar mobilization, Taping, and Manual therapy ?  ?PLAN FOR NEXT SESSION: skin check,  PROM Lt shoulder, STM, scapular and GH mob, working on increase AROM and decreasing shoulder pain with overhead reach.   ?  ?  ? ? ?Stark Bray, PT ?07/22/2021, 11:02 PM ? ?  ? ?

## 2021-07-26 ENCOUNTER — Other Ambulatory Visit: Payer: Self-pay

## 2021-07-26 ENCOUNTER — Ambulatory Visit (HOSPITAL_COMMUNITY)
Admission: RE | Admit: 2021-07-26 | Discharge: 2021-07-26 | Disposition: A | Discharge status: Home / Self Care | Payer: BC Managed Care – PPO | Source: Ambulatory Visit | Attending: Hematology | Admitting: Hematology

## 2021-07-26 DIAGNOSIS — Z8742 Personal history of other diseases of the female genital tract: Secondary | ICD-10-CM | POA: Diagnosis not present

## 2021-07-26 DIAGNOSIS — N83209 Unspecified ovarian cyst, unspecified side: Secondary | ICD-10-CM | POA: Diagnosis not present

## 2021-07-26 DIAGNOSIS — D0512 Intraductal carcinoma in situ of left breast: Secondary | ICD-10-CM

## 2021-07-28 ENCOUNTER — Encounter: Payer: Self-pay | Admitting: Hematology

## 2021-07-29 ENCOUNTER — Ambulatory Visit: Payer: BC Managed Care – PPO | Attending: Radiation Oncology

## 2021-07-29 DIAGNOSIS — M7981 Nontraumatic hematoma of soft tissue: Secondary | ICD-10-CM | POA: Diagnosis present

## 2021-07-29 DIAGNOSIS — N6489 Other specified disorders of breast: Secondary | ICD-10-CM | POA: Insufficient documentation

## 2021-07-29 DIAGNOSIS — M25612 Stiffness of left shoulder, not elsewhere classified: Secondary | ICD-10-CM | POA: Insufficient documentation

## 2021-07-29 DIAGNOSIS — D0512 Intraductal carcinoma in situ of left breast: Secondary | ICD-10-CM | POA: Diagnosis present

## 2021-07-29 NOTE — Therapy (Signed)
?OUTPATIENT PHYSICAL THERAPY TREATMENT NOTE ? ? ?Patient Name: Beth Stephens ?MRN: [id number] ?DOB:1976-01-21, 46 y.o., female ?Today's Date: 07/29/2021 ? ?PCP: Janie Morning, DO ?REFERRING PROVIDER: Eppie Gibson, MD ? ?END OF SESSION:  ? PT End of Session - 07/29/21 1611   ? ? Visit Number 3   ? Number of Visits 6   ? Date for PT Re-Evaluation 08/13/21   ? Authorization Type BCBS auth unknown at eval   ? Authorization - Visit Number 3   ? Authorization - Number of Visits 30   ? PT Start Time 1194   pt arrived late  ? PT Stop Time 1740   ? PT Time Calculation (min) 55 min   ? Activity Tolerance Patient tolerated treatment well   ? Behavior During Therapy [facility] for tasks assessed/performed   ? ?  ?  ? ?  ? ? ?Past Medical History:  ?Diagnosis Date  ? Cancer [facility])   ? Breast Cancer  ? Colitis   ? Colitis   ? ?Past Surgical History:  ?Procedure Laterality Date  ? BREAST LUMPECTOMY WITH RADIOACTIVE SEED LOCALIZATION Left 05/01/2021  ? Procedure: LEFT BREAST LUMPECTOMY WITH RADIOACTIVE SEED LOCALIZATION;  Surgeon: Stark Klein, MD;  Location: [place];  Service: General;  Laterality: Left;  ? COLONOSCOPY    ? WISDOM TOOTH EXTRACTION    ? ?Patient Active Problem List  ? Diagnosis Date Noted  ? Iron deficiency anemia due to chronic blood loss 05/24/2021  ? Genetic testing 04/22/2021  ? Ductal carcinoma in situ (DCIS) of left breast 04/08/2021  ? ? ?REFERRING DIAG: DCIS Lt breast ? ?THERAPY DIAG:  ?Ductal carcinoma in situ (DCIS) of left breast ? ?Nontraumatic hematoma of soft tissue ? ?Breast edema ? ?Stiffness of left shoulder, not elsewhere classified ? ?PERTINENT HISTORY: Left lumpectomy 05/01/21 no SLNB with Dr. Barry Dienes.  Large hematoma formation. Radiation completed ? ?PRECAUTIONS: Other: very small lymphedema risk Lt UE and breast due to radiation ? ?SUBJECTIVE: The skin is improving. I want to start the manual lymph drainage as well.  ? ?PAIN:  ?Are you having pain? Yes: NPRS scale: 0/10 - it is up to 6/10 pinch when I lift  overhead ?Pain location: anterior shoulder ?Pain description: sharp pinch ?Aggravating factors: reaching overhead and rolling on it ?Relieving factors: rest ? ? ?OBJECTIVE:  ?PALPATION: Very hard mandarin orange size hematoma under lateral breast incision, +2 ttp axillary palpation with some puffiness here.  Overall breast not swollen.  ?  ? OBSERVATIONS / OTHER ASSESSMENTS: 07/22/21: radiated field still red and healing especially inferior to breast, and peeling blister area in the axilla.        07/02/21: healing hematoma Lt lateral breast, puffiness Lt axilla, wearing soft cotton eye clasp front bra which has no compression but pt reports is comfortable and the only fabric that is okay to wear currently.  ?  ?UPPER EXTREMITY AROM/PROM: ?  ?A/PROM RIGHT  07/02/2021 ?    ?Shoulder extension 53   ?Shoulder flexion 174   ?Shoulder abduction 165   ?Shoulder internal rotation Behind the back to T6   ?Shoulder external rotation 90   ?                        (Blank rows = not tested) ?  ?A/PROM LEFT  07/02/2021 07/22/21  ?Shoulder extension 54   ?Shoulder flexion 165 145 - ache top of shoulder   ?Shoulder abduction 160 155 - ache top of the shoulder   ?  Shoulder internal rotation Behind the back to T4   ?Shoulder external rotation 90   ?                        (Blank rows = not tested) ?  ?TODAY'S TREATMENT  ?07/29/21: ?Manual Therapy: ?STM Lt UT, anterior shoulder, and latissimus avoiding radiated area (though this was much improved today) with trigger point release as needed in UT with light pressure ?PROM to tolerance into flexion, scaption and then was eventually able to get into abduction applying scapular depression throughout ?[place] AP grade IV- 4x20" ?Sidelying STM Lt scapular region and UT with cocoa butter and light pressure as pt reports she generally doesn't tolerate deep pressure ?MLD: In Supine: Short neck, 5 diaphragmatic breaths, Lt inguinal nodes and Lt axillo-inguinal anastomosis focusing on hematoma at lateral  breast, some mod deeper fibrotic techniques here briefly ?Therapeutic Exercises: ?Pulleys into flexion and abduction x2 mins each returning therapist demo and tactile and VCs throughout to decrease Lt scapular compensation ? ?07/22/21: ?STM Lt UT, anterior shoulder, and latissimus avoiding radiated area with trigger point release as needed in UT ?PROM to tolerance into flexion ?[place] AP grade IV- 4x20" ?Sidelying STM Lt scapular region without much tension and no pain ? ? ?07/02/21:Review of compression bra options and how it is okay to wear a compression bra during radiation until it is not comfortable - gave pt script for bra and information for second to nature.  Also gave ordering information on [employer] for kimbee swell spot for lateral bra use to soften hematoma. ?Education on self MLD strokes towards the Lt axilla as pt had no lymph nodes removed ?Performance of HEP with email handout - did not get code copied ?   Supine bil shoulder extension  press into mat 5" x 6 ?   Bil protraction/retraction x 10 ?   Alternating shoulder flexion with focus on setting shoulder blade to decrease impingement ?   Seated retraction ?   Mermaid stretch ?            ?  ?PATIENT EDUCATION:  ?Education details: per above ?Person educated: Patient ?Education method: Explanation, Demonstration, Tactile cues, Verbal cues, and Handouts ?Education comprehension: verbalized understanding, returned demonstration, verbal cues required, and needs further education ?  ?  ?HOME EXERCISE PROGRAM: ?Supine bil shoulder extension  press into mat 5" x 6 ?            Bil protraction/retraction x 10 ?            Alternating shoulder flexion with focus on setting shoulder blade to decrease impingement ?            Seated retraction ?            Mermaid stretch ?  ?ASSESSMENT: ?  ?CLINICAL IMPRESSION: ?Pts skin healing well since radiation. Avoided areas of peeling skin. Continued with manual therapy working towards decreasing muscular tightness in Lt upper  quadrant while improving her Lt shoulder P/ROM. She had less superior shoulder pain by end of session and improved end P/ROM. Added pulleys to focus on instruction of decreasing Lt scapular compensation throughout ROM. Pt was able to return improved demo after cuing. She reports her lateral trunk feeling loser by end of session.  ?  ?OBJECTIVE IMPAIRMENTS decreased knowledge of use of DME, decreased ROM, and increased edema.  ?  ?ACTIVITY LIMITATIONS community activity and yard work.  ?  ?PERSONAL FACTORS 1 comorbidity: hematoma complication  are also affecting patient's functional outcome.  ?  ?  ?REHAB POTENTIAL: Excellent ?  ?CLINICAL DECISION MAKING: Stable/uncomplicated ?  ?EVALUATION COMPLEXITY: Low ? ?GOALS: ?Goals reviewed with patient? Yes ?  ?  ?LONG TERM GOALS: Target date: 08/13/2021 ?  ?Pt will improve Lt UE AROM to equal to the Rt UE and without pain ?Baseline:  ?Goal status: INITIAL ?  ?2.  Pt will be ind with self care regarding Lt breast hematoma including self massage, compression bra, and use of foam or swell spot ?Baseline:  ?Goal status: INITIAL ?  ?PLAN: ?PT FREQUENCY: 2x/week ?  ?PT DURATION: 4weeks ?  ?PLANNED INTERVENTIONS: Therapeutic exercises, Patient/Family education, Joint mobilization, DME instructions, Manual lymph drainage, Compression bandaging, scar mobilization, Taping, and Manual therapy ?  ?PLAN FOR NEXT SESSION: PROM Lt shoulder, STM, scapular and GH mob, working on increase AROM and decreasing shoulder pain with overhead reach; try adding [place] with yellow?   ?  ?  ? ? ?Otelia Limes, PTA ?07/29/2021, 5:13 PM ? ?  ? ?

## 2021-08-06 ENCOUNTER — Encounter: Payer: BC Managed Care – PPO | Admitting: Rehabilitation

## 2021-08-08 ENCOUNTER — Ambulatory Visit: Payer: BC Managed Care – PPO

## 2021-08-08 DIAGNOSIS — M7981 Nontraumatic hematoma of soft tissue: Secondary | ICD-10-CM

## 2021-08-08 DIAGNOSIS — M25612 Stiffness of left shoulder, not elsewhere classified: Secondary | ICD-10-CM

## 2021-08-08 DIAGNOSIS — D0512 Intraductal carcinoma in situ of left breast: Secondary | ICD-10-CM

## 2021-08-08 DIAGNOSIS — N6489 Other specified disorders of breast: Secondary | ICD-10-CM

## 2021-08-08 NOTE — Therapy (Signed)
?OUTPATIENT PHYSICAL THERAPY TREATMENT NOTE ? ? ?Patient Name: Beth Stephens ?MRN: [id number] ?DOB:April 17, 1975, 46 y.o., female ?Today's Date: 08/08/2021 ? ?PCP: Janie Morning, DO ?REFERRING PROVIDER: Eppie Gibson, MD ? ?END OF SESSION:  ? PT End of Session - 08/08/21 1717   ? ? Visit Number 4   ? Number of Visits 6   ? Date for PT Re-Evaluation 08/13/21   ? Authorization Type BCBS auth unknown at eval   ? Authorization - Visit Number 4   ? Authorization - Number of Visits 30   ? PT Start Time 6195   ? PT Stop Time 0932   ? PT Time Calculation (min) 54 min   ? Activity Tolerance Patient tolerated treatment well   ? Behavior During Therapy [facility] for tasks assessed/performed   ? ?  ?  ? ?  ? ? ? ?Past Medical History:  ?Diagnosis Date  ? Cancer [facility])   ? Breast Cancer  ? Colitis   ? Colitis   ? ?Past Surgical History:  ?Procedure Laterality Date  ? BREAST LUMPECTOMY WITH RADIOACTIVE SEED LOCALIZATION Left 05/01/2021  ? Procedure: LEFT BREAST LUMPECTOMY WITH RADIOACTIVE SEED LOCALIZATION;  Surgeon: Stark Klein, MD;  Location: [place];  Service: General;  Laterality: Left;  ? COLONOSCOPY    ? WISDOM TOOTH EXTRACTION    ? ?Patient Active Problem List  ? Diagnosis Date Noted  ? Iron deficiency anemia due to chronic blood loss 05/24/2021  ? Genetic testing 04/22/2021  ? Ductal carcinoma in situ (DCIS) of left breast 04/08/2021  ? ? ?REFERRING DIAG: DCIS Lt breast ? ?THERAPY DIAG:  ?Ductal carcinoma in situ (DCIS) of left breast ? ?Nontraumatic hematoma of soft tissue ? ?Breast edema ? ?Stiffness of left shoulder, not elsewhere classified ? ?PERTINENT HISTORY: Left lumpectomy 05/01/21 no SLNB with Dr. Barry Dienes.  Large hematoma formation. Radiation completed ? ?PRECAUTIONS: Other: very small lymphedema risk Lt UE and breast due to radiation ? ?SUBJECTIVE: Still have the pinching sensation in the left shoulder, but it gets better. The breast swelling is getting better and my skin has improved ? ?PAIN:  ?Are you having pain? Yes:  NPRS scale: it is up to 5-6/10 pinch when I lift overhead ?Pain location: anterior/posterior shoulder ?Pain description: sharp pinch ?Aggravating factors: reaching overhead and rolling on it ?Relieving factors: rest ? ? ?OBJECTIVE:  ?PALPATION: Very hard mandarin orange size hematoma under lateral breast incision, +2 ttp axillary palpation with some puffiness here.  Overall breast not swollen.  ?  ? OBSERVATIONS / OTHER ASSESSMENTS: 07/22/21: radiated field still red and healing especially inferior to breast, and peeling blister area in the axilla.        07/02/21: healing hematoma Lt lateral breast, puffiness Lt axilla, wearing soft cotton eye clasp front bra which has no compression but pt reports is comfortable and the only fabric that is okay to wear currently.  ?  ?UPPER EXTREMITY AROM/PROM: ?  ?A/PROM RIGHT  07/02/2021 ?    ?Shoulder extension 53   ?Shoulder flexion 174   ?Shoulder abduction 165   ?Shoulder internal rotation Behind the back to T6   ?Shoulder external rotation 90   ?                        (Blank rows = not tested) ?  ?A/PROM LEFT  07/02/2021 07/22/21  ?Shoulder extension 54   ?Shoulder flexion 165 145 - ache top of shoulder   ?Shoulder abduction 160 155 - ache  top of the shoulder   ?Shoulder internal rotation Behind the back to T4   ?Shoulder external rotation 90   ?                        (Blank rows = not tested) ?  ?TODAY'S TREATMENT  ?08/07/2021 ? ?Manual Therapy: ?STM Lt UT, anterior/lateral shoulder, and latissimus avoiding radiated area Sidelying STM Lt scapular region and UT  all with cocoa butter and light pressure ?Gr. 3/4 GH Posterior and inferior mobs ?PROM flexion, scaption abduction with emphasis on scapular depression ?MLD: In Supine: Short neck, 5 diaphragmatic breaths, Lt inguinal nodes and Lt axillo-inguinal anastomosis focusing on hematoma at lateral breast, some mod deeper fibrotic techniques here briefly ?Therapeutic exercise; educated in scapular retraction with yellow band x 10,  IR x 5 and ER with bilateral UEs x 5, updated HEP ?07/29/21: ?Manual Therapy: ?STM Lt UT, anterior shoulder, and latissimus avoiding radiated area (though this was much improved today) with trigger point release as needed in UT with light pressure ?PROM to tolerance into flexion, scaption and then was eventually able to get into abduction applying scapular depression throughout ?[place] AP grade IV- 4x20" ?Sidelying STM Lt scapular region and UT with cocoa butter and light pressure as pt reports she generally doesn't tolerate deep pressure ?MLD: In Supine: Short neck, 5 diaphragmatic breaths, Lt inguinal nodes and Lt axillo-inguinal anastomosis focusing on hematoma at lateral breast, some mod deeper fibrotic techniques here briefly ?Therapeutic Exercises: ?Pulleys into flexion and abduction x2 mins each returning therapist demo and tactile and VCs throughout to decrease Lt scapular compensation ? ?07/22/21: ?STM Lt UT, anterior shoulder, and latissimus avoiding radiated area with trigger point release as needed in UT ?PROM to tolerance into flexion ?[place] AP grade IV- 4x20" ?Sidelying STM Lt scapular region without much tension and no pain ? ? ?07/02/21:Review of compression bra options and how it is okay to wear a compression bra during radiation until it is not comfortable - gave pt script for bra and information for second to nature.  Also gave ordering information on amaz[employer] for kimbee swell spot for lateral bra use to soften hematoma. ?Education on self MLD strokes towards the Lt axilla as pt had no lymph nodes removed ?Performance of HEP with email handout - did not get code copied ?   Supine bil shoulder extension  press into mat 5" x 6 ?   Bil protraction/retraction x 10 ?   Alternating shoulder flexion with focus on setting shoulder blade to decrease impingement ?   Seated retraction ?   Mermaid stretch ?            ?  ?PATIENT EDUCATION:  ?Education details: theraband exs SR, IR and ER with yellow ?Person educated:  Patient ?Education method: Explanation, Demonstration, Tactile cues, Verbal cues, and Handouts ?Education comprehension: verbalized understanding, returned demonstration, verbal cues required, and needs further education ?  ?  ?HOME EXERCISE PROGRAM: ?Supine bil shoulder extension  press into mat 5" x 6 ?            Bil protraction/retraction x 10 ?            Alternating shoulder flexion with focus on setting shoulder blade to decrease impingement ?            Seated retraction ?            Mermaid stretch ? Scapular retraction, IR and ER with yellow band x 5  ea ?ASSESSMENT: ?  ?CLINICAL IMPRESSION: ?Pts skin healing well since radiation. Continued soft tissue mobilization, PROM of left shoulder and MLD. Pt had several pinches during PROM into abduction, but nothing with flexion or scaption. Pt was instructed in standing yellow theraband for scapular retraction, IR and ER.  She did well after practicing to get proper technique.  She needs continual VC's to stand erect during exs and I demonstrated to her the effect that posture has on shoulder pain. ? ?OBJECTIVE IMPAIRMENTS decreased knowledge of use of DME, decreased ROM, and increased edema.  ?  ?ACTIVITY LIMITATIONS community activity and yard work.  ?  ?PERSONAL FACTORS 1 comorbidity: hematoma complication  are also affecting patient's functional outcome.  ?  ?  ?REHAB POTENTIAL: Excellent ?  ?CLINICAL DECISION MAKING: Stable/uncomplicated ?  ?EVALUATION COMPLEXITY: Low ? ?GOALS: ?Goals reviewed with patient? Yes ?  ?  ?LONG TERM GOALS: Target date: 08/13/2021 ?  ?Pt will improve Lt UE AROM to equal to the Rt UE and without pain ?Baseline:  ?Goal status: INITIAL ?  ?2.  Pt will be ind with self care regarding Lt breast hematoma including self massage, compression bra, and use of foam or swell spot ?Baseline:  ?Goal status: INITIAL ?  ?PLAN: ?PT FREQUENCY: 2x/week ?  ?PT DURATION: 4weeks ?  ?PLANNED INTERVENTIONS: Therapeutic exercises, Patient/Family education,  Joint mobilization, DME instructions, Manual lymph drainage, Compression bandaging, scar mobilization, Taping, and Manual therapy ?  ?PLAN FOR NEXT SESSION: PROM Lt shoulder, STM, scapular and GH mob, workin

## 2021-08-12 ENCOUNTER — Inpatient Hospital Stay: Payer: BC Managed Care – PPO | Attending: Hematology

## 2021-08-12 DIAGNOSIS — S2000XA Contusion of breast, unspecified breast, initial encounter: Secondary | ICD-10-CM

## 2021-08-12 DIAGNOSIS — D509 Iron deficiency anemia, unspecified: Secondary | ICD-10-CM | POA: Insufficient documentation

## 2021-08-12 DIAGNOSIS — D500 Iron deficiency anemia secondary to blood loss (chronic): Secondary | ICD-10-CM

## 2021-08-12 LAB — CBC WITH DIFFERENTIAL/PLATELET
Abs Immature Granulocytes: 0.02 10*3/uL (ref 0.00–0.07)
Basophils Absolute: 0 10*3/uL (ref 0.0–0.1)
Basophils Relative: 0 %
Eosinophils Absolute: 0.1 10*3/uL (ref 0.0–0.5)
Eosinophils Relative: 1 %
HCT: 39.4 % (ref 36.0–46.0)
Hemoglobin: 12.8 g/dL (ref 12.0–15.0)
Immature Granulocytes: 0 %
Lymphocytes Relative: 20 %
Lymphs Abs: 1.7 10*3/uL (ref 0.7–4.0)
MCH: 26.3 pg (ref 26.0–34.0)
MCHC: 32.5 g/dL (ref 30.0–36.0)
MCV: 81.1 fL (ref 80.0–100.0)
Monocytes Absolute: 0.7 10*3/uL (ref 0.1–1.0)
Monocytes Relative: 8 %
Neutro Abs: 5.9 10*3/uL (ref 1.7–7.7)
Neutrophils Relative %: 71 %
Platelets: 164 10*3/uL (ref 150–400)
RBC: 4.86 MIL/uL (ref 3.87–5.11)
RDW: 13.3 % (ref 11.5–15.5)
WBC: 8.4 10*3/uL (ref 4.0–10.5)
nRBC: 0 % (ref 0.0–0.2)

## 2021-08-12 LAB — IRON AND IRON BINDING CAPACITY (CC-WL,HP ONLY)
Iron: 49 ug/dL (ref 28–170)
Saturation Ratios: 13 % (ref 10.4–31.8)
TIBC: 382 ug/dL (ref 250–450)
UIBC: 333 ug/dL (ref 148–442)

## 2021-08-13 ENCOUNTER — Encounter: Payer: Self-pay | Admitting: Rehabilitation

## 2021-08-13 ENCOUNTER — Ambulatory Visit: Payer: BC Managed Care – PPO | Admitting: Rehabilitation

## 2021-08-13 DIAGNOSIS — D0512 Intraductal carcinoma in situ of left breast: Secondary | ICD-10-CM

## 2021-08-13 DIAGNOSIS — M7981 Nontraumatic hematoma of soft tissue: Secondary | ICD-10-CM

## 2021-08-13 DIAGNOSIS — N6489 Other specified disorders of breast: Secondary | ICD-10-CM

## 2021-08-13 DIAGNOSIS — M25612 Stiffness of left shoulder, not elsewhere classified: Secondary | ICD-10-CM

## 2021-08-13 LAB — FERRITIN: Ferritin: 40 ng/mL (ref 11–307)

## 2021-08-13 NOTE — Therapy (Signed)
?OUTPATIENT PHYSICAL THERAPY TREATMENT NOTE ? ? ?Patient Name: Beth Stephens ?MRN: [id number] ?DOB:1975-10-05, 46 y.o., female ?Today's Date: 08/13/2021 ? ?PCP: Janie Morning, DO ?REFERRING PROVIDER: Eppie Gibson, MD ? ?END OF SESSION:  ? PT End of Session - 08/13/21 1507   ? ? Visit Number 5   ? Number of Visits 6   ? Date for PT Re-Evaluation 08/13/21   ? Authorization - Visit Number 5   ? Authorization - Number of Visits 30   ? PT Start Time 1508   ? PT Stop Time 1601   ? PT Time Calculation (min) 53 min   ? Activity Tolerance Patient tolerated treatment well   ? Behavior During Therapy [facility] for tasks assessed/performed   ? ?  ?  ? ?  ? ? ? ? ?Past Medical History:  ?Diagnosis Date  ? Cancer [facility])   ? Breast Cancer  ? Colitis   ? Colitis   ? ?Past Surgical History:  ?Procedure Laterality Date  ? BREAST LUMPECTOMY WITH RADIOACTIVE SEED LOCALIZATION Left 05/01/2021  ? Procedure: LEFT BREAST LUMPECTOMY WITH RADIOACTIVE SEED LOCALIZATION;  Surgeon: Stark Klein, MD;  Location: [place];  Service: General;  Laterality: Left;  ? COLONOSCOPY    ? WISDOM TOOTH EXTRACTION    ? ?Patient Active Problem List  ? Diagnosis Date Noted  ? Iron deficiency anemia due to chronic blood loss 05/24/2021  ? Genetic testing 04/22/2021  ? Ductal carcinoma in situ (DCIS) of left breast 04/08/2021  ? ? ?REFERRING DIAG: DCIS Lt breast ? ?THERAPY DIAG:  ?Ductal carcinoma in situ (DCIS) of left breast ? ?Nontraumatic hematoma of soft tissue ? ?Breast edema ? ?Stiffness of left shoulder, not elsewhere classified ? ?PERTINENT HISTORY: Left lumpectomy 05/01/21 no SLNB with Dr. Barry Dienes.  Large hematoma formation. Radiation completed ? ?PRECAUTIONS: Other: very small lymphedema risk Lt UE and breast due to radiation ? ?SUBJECTIVE: Sometimes it gets better and then I aggravate it during the weekend.   ? ?PAIN:  ?Are you having pain? Yes: NPRS scale: it is up to 5-6/10 pinch when I lift overhead ?Pain location: anterior/posterior shoulder ?Pain  description: sharp pinch ?Aggravating factors: reaching overhead and rolling on it ?Relieving factors: rest ? ? ?OBJECTIVE:  ?PALPATION: Very hard mandarin orange size hematoma under lateral breast incision, +2 ttp axillary palpation with some puffiness here.  Overall breast not swollen.  ?  ? OBSERVATIONS / OTHER ASSESSMENTS: 07/22/21: radiated field still red and healing especially inferior to breast, and peeling blister area in the axilla.        07/02/21: healing hematoma Lt lateral breast, puffiness Lt axilla, wearing soft cotton eye clasp front bra which has no compression but pt reports is comfortable and the only fabric that is okay to wear currently.  ?  ?UPPER EXTREMITY AROM/PROM: ?  ?A/PROM RIGHT  07/02/2021 ?    ?Shoulder extension 53   ?Shoulder flexion 174   ?Shoulder abduction 165   ?Shoulder internal rotation Behind the back to T6   ?Shoulder external rotation 90   ?                        (Blank rows = not tested) ?  ?A/PROM LEFT  07/02/2021 07/22/21 08/13/21  ?Shoulder extension 54    ?Shoulder flexion 165 145 - ache top of shoulder  155 - with top of the shoulder pinch  ?Shoulder abduction 160 155 - ache top of the shoulder  155 - ache top  of the shoulder   ?Shoulder internal rotation Behind the back to T4    ?Shoulder external rotation 90    ?                        (Blank rows = not tested) ?  ?TODAY'S TREATMENT  ?08/13/21: ?Manual Therapy: ?STM Lt UT, anterior/lateral shoulder, pectoralis, and latissimus  Sidelying STM Lt scapular region and UT  all with cocoa butter and moderate pressure ?Gr. 3/4 GH Posterior and inferior mobs in various positions ?PROM flexion, scaption abduction with emphasis on scapular depression and avoiding pinch ?Sidelying MWM into abduction to 90deg with lat release x 5 ?MLD: In Supine: Short neck, 5 diaphragmatic breaths, Lt inguinal nodes and Lt axillo-inguinal anastomosis focusing on hematoma at lateral breast, some mod deeper fibrotic techniques here briefly ?Therapeutic  exercise; retraction with yellow band x 10, bil extension yellow x 10, forearms on wall protraction/retraction with sig vcs for completion and unable to do in arm straight position.   ? ? ? ?08/07/2021 ?Manual Therapy: ?STM Lt UT, anterior/lateral shoulder, and latissimus avoiding radiated area Sidelying STM Lt scapular region and UT  all with cocoa butter and light pressure ?Gr. 3/4 GH Posterior and inferior mobs ?PROM flexion, scaption abduction with emphasis on scapular depression ?MLD: In Supine: Short neck, 5 diaphragmatic breaths, Lt inguinal nodes and Lt axillo-inguinal anastomosis focusing on hematoma at lateral breast, some mod deeper fibrotic techniques here briefly ?Therapeutic exercise; educated in scapular retraction with yellow band x 10, IR x 5 and ER with bilateral UEs x 5, updated HEP ? ?07/29/21: ?Manual Therapy: ?STM Lt UT, anterior shoulder, and latissimus avoiding radiated area (though this was much improved today) with trigger point release as needed in UT with light pressure ?PROM to tolerance into flexion, scaption and then was eventually able to get into abduction applying scapular depression throughout ?[place] AP grade IV- 4x20" ?Sidelying STM Lt scapular region and UT with cocoa butter and light pressure as pt reports she generally doesn't tolerate deep pressure ?MLD: In Supine: Short neck, 5 diaphragmatic breaths, Lt inguinal nodes and Lt axillo-inguinal anastomosis focusing on hematoma at lateral breast, some mod deeper fibrotic techniques here briefly ?Therapeutic Exercises: ?Pulleys into flexion and abduction x2 mins each returning therapist demo and tactile and VCs throughout to decrease Lt scapular compensation ?             ?PATIENT EDUCATION:  ?Education details: theraband exs SR, IR and ER with yellow ?Person educated: Patient ?Education method: Explanation, Demonstration, Tactile cues, Verbal cues, and Handouts ?Education comprehension: verbalized understanding, returned demonstration,  verbal cues required, and needs further education  ?  ?HOME EXERCISE PROGRAM: ?Supine bil shoulder extension  press into mat 5" x 6 ?            Bil protraction/retraction x 10 ?            Alternating shoulder flexion with focus on setting shoulder blade to decrease impingement ?            Seated retraction ?            Mermaid stretch ? Scapular retraction, IR and ER with yellow band x 5 ea ? ?ASSESSMENT: ?  ?CLINICAL IMPRESSION: ?Continued soft tissue mobilization, PROM of left shoulder and MLD. Pt had several pinches during PROM into abduction, but nothing with flexion or scaption. Her pain is moving more from posterior shoulder to anterior shoulder and centralizing/shrinking in overall size.  Will tape next visit.  ? ?OBJECTIVE IMPAIRMENTS decreased knowledge of use of DME, decreased ROM, and increased edema.  ?  ?ACTIVITY LIMITATIONS community activity and yard work.  ?  ?PERSONAL FACTORS 1 comorbidity: hematoma complication  are also affecting patient's functional outcome.  ?  ?  ?REHAB POTENTIAL: Excellent ?  ?CLINICAL DECISION MAKING: Stable/uncomplicated ?  ?EVALUATION COMPLEXITY: Low ? ?GOALS: ?Goals reviewed with patient? Yes ?  ?  ?LONG TERM GOALS: Target date: 08/13/2021 ?  ?Pt will improve Lt UE AROM to equal to the Rt UE and without pain ?Baseline:  ?Goal status: INITIAL ?  ?2.  Pt will be ind with self care regarding Lt breast hematoma including self massage, compression bra, and use of foam or swell spot ?Baseline:  ?Goal status: INITIAL ?  ?PLAN: ?PT FREQUENCY: 2x/week ?  ?PT DURATION: 4weeks ?  ?PLANNED INTERVENTIONS: Therapeutic exercises, Patient/Family education, Joint mobilization, DME instructions, Manual lymph drainage, Compression bandaging, scar mobilization, Taping, and Manual therapy ?  ?PLAN FOR NEXT SESSION: PROM Lt shoulder, STM, scapular and GH mob, working on increase AROM and decreasing shoulder pain with overhead reach; Tape* ?  ?  ? ? ?Stark Bray, PT ?08/13/2021, 4:05 PM ? ?   ? ?

## 2021-08-14 ENCOUNTER — Ambulatory Visit: Payer: BC Managed Care – PPO | Admitting: Rehabilitation

## 2021-08-14 ENCOUNTER — Encounter: Payer: Self-pay | Admitting: Rehabilitation

## 2021-08-14 DIAGNOSIS — D0512 Intraductal carcinoma in situ of left breast: Secondary | ICD-10-CM | POA: Diagnosis not present

## 2021-08-14 DIAGNOSIS — N6489 Other specified disorders of breast: Secondary | ICD-10-CM

## 2021-08-14 DIAGNOSIS — M7981 Nontraumatic hematoma of soft tissue: Secondary | ICD-10-CM

## 2021-08-14 DIAGNOSIS — M25612 Stiffness of left shoulder, not elsewhere classified: Secondary | ICD-10-CM

## 2021-08-14 LAB — FACTOR 11 ASSAY: Factor XI Activity: 86 % (ref 60–150)

## 2021-08-14 LAB — FACTOR 8 ASSAY: Coagulation Factor VIII: 102 % (ref 56–140)

## 2021-08-14 LAB — FACTOR 13 ACTIVITY: Factor XIII, Qualitative: NORMAL

## 2021-08-14 LAB — FACTOR 9 ASSAY: Coagulation Factor IX: 95 % (ref 60–177)

## 2021-08-14 NOTE — Therapy (Signed)
?OUTPATIENT PHYSICAL THERAPY TREATMENT NOTE ? ? ?Patient Name: Beth Stephens ?MRN: [id number] ?DOB:February 02, 1976, 46 y.o., female ?Today's Date: 08/14/2021 ? ?PCP: Janie Morning, DO ?REFERRING PROVIDER: Eppie Gibson, MD ? ?END OF SESSION:  ? PT End of Session - 08/14/21 1507   ? ? Visit Number 6   ? Number of Visits 12   ? Date for PT Re-Evaluation 09/03/21   ? Authorization - Visit Number 6   ? PT Start Time 1508   ? PT Stop Time 9024   ? PT Time Calculation (min) 49 min   ? Activity Tolerance Patient tolerated treatment well   ? Behavior During Therapy [facility] for tasks assessed/performed   ? ?  ?  ? ?  ? ? ? ? ?Past Medical History:  ?Diagnosis Date  ? Cancer [facility])   ? Breast Cancer  ? Colitis   ? Colitis   ? ?Past Surgical History:  ?Procedure Laterality Date  ? BREAST LUMPECTOMY WITH RADIOACTIVE SEED LOCALIZATION Left 05/01/2021  ? Procedure: LEFT BREAST LUMPECTOMY WITH RADIOACTIVE SEED LOCALIZATION;  Surgeon: Stark Klein, MD;  Location: [place];  Service: General;  Laterality: Left;  ? COLONOSCOPY    ? WISDOM TOOTH EXTRACTION    ? ?Patient Active Problem List  ? Diagnosis Date Noted  ? Iron deficiency anemia due to chronic blood loss 05/24/2021  ? Genetic testing 04/22/2021  ? Ductal carcinoma in situ (DCIS) of left breast 04/08/2021  ? ? ?REFERRING DIAG: DCIS Lt breast ? ?THERAPY DIAG:  ?Ductal carcinoma in situ (DCIS) of left breast ? ?Nontraumatic hematoma of soft tissue ? ?Breast edema ? ?Stiffness of left shoulder, not elsewhere classified ? ?PERTINENT HISTORY: Left lumpectomy 05/01/21 no SLNB with Dr. Barry Dienes.  Large hematoma formation. Radiation completed ? ?PRECAUTIONS: Other: very small lymphedema risk Lt UE and breast due to radiation ? ?SUBJECTIVE:   No changes ? ?PAIN:  ?Are you having pain? NO -  it is up to 5-6/10 pinch when I lift overhead ?Pain location: anterior/posterior shoulder ?Pain description: sharp pinch ?Aggravating factors: reaching overhead and rolling on it ?Relieving factors:  rest ? ? ?OBJECTIVE:  ?PALPATION: Very hard mandarin orange size hematoma under lateral breast incision, +2 ttp axillary palpation with some puffiness here.  Overall breast not swollen.  ?  ? OBSERVATIONS / OTHER ASSESSMENTS: 07/22/21: radiated field still red and healing especially inferior to breast, and peeling blister area in the axilla.        07/02/21: healing hematoma Lt lateral breast, puffiness Lt axilla, wearing soft cotton eye clasp front bra which has no compression but pt reports is comfortable and the only fabric that is okay to wear currently.  ?  ?UPPER EXTREMITY AROM/PROM: ?  ?A/PROM RIGHT  07/02/2021 ?    ?Shoulder extension 53   ?Shoulder flexion 174   ?Shoulder abduction 165   ?Shoulder internal rotation Behind the back to T6   ?Shoulder external rotation 90   ?                        (Blank rows = not tested) ?  ?A/PROM LEFT  07/02/2021 07/22/21 08/13/21  ?Shoulder extension 54    ?Shoulder flexion 165 145 - ache top of shoulder  155 - with top of the shoulder pinch  ?Shoulder abduction 160 155 - ache top of the shoulder  155 - ache top of the shoulder   ?Shoulder internal rotation Behind the back to T4    ?Shoulder external rotation  90    ?                        (Blank rows = not tested) ?  ?TODAY'S TREATMENT  ?08/14/21 ?Therapeutic exercise;  ?retraction with yellow band x 10, bil extension yellow x 10,  ?Pulleys 1min flexion and 62min abduction avoiding pinch ?Attempted yellow bil ER but with pinch - switched to ER walk outs 3# at cable machine.  ER and IR 2 steps x 10 each ?Attempted supine pro/ret but unable to isolate/perform - swtiched to small 2# c/cc circles x 10 each ?Sidelying ER AROM x 8 left ? ?Manual Therapy: ?STM Lt UT, anterior/lateral shoulder, pectoralis, and latissimus   ?Gr. 3/4 GH Posterior and inferior mobs in various positions ?PROM flexion, scaption abduction with emphasis on scapular depression and avoiding pinch ?MLD: In Supine: Short neck, 5 diaphragmatic breaths, Lt inguinal  nodes and Lt axillo-inguinal anastomosis focusing on hematoma at lateral breast, some mod deeper fibrotic techniques here briefly ?Kinesiotape: y strip with anchor on anterior shoulder with 2 strips over shoulder onto scapula  ? ? ?08/13/21: ?Manual Therapy: ?STM Lt UT, anterior/lateral shoulder, pectoralis, and latissimus  Sidelying STM Lt scapular region and UT  all with cocoa butter and moderate pressure ?Gr. 3/4 GH Posterior and inferior mobs in various positions ?PROM flexion, scaption abduction with emphasis on scapular depression and avoiding pinch ?Sidelying MWM into abduction to 90deg with lat release x 5 ?MLD: In Supine: Short neck, 5 diaphragmatic breaths, Lt inguinal nodes and Lt axillo-inguinal anastomosis focusing on hematoma at lateral breast, some mod deeper fibrotic techniques here briefly ?Therapeutic exercise; retraction with yellow band x 10, bil extension yellow x 10, forearms on wall protraction/retraction with sig vcs for completion and unable to do in arm straight position.   ? ? ? ?08/07/2021 ?Manual Therapy: ?STM Lt UT, anterior/lateral shoulder, and latissimus avoiding radiated area Sidelying STM Lt scapular region and UT  all with cocoa butter and light pressure ?Gr. 3/4 GH Posterior and inferior mobs ?PROM flexion, scaption abduction with emphasis on scapular depression ?MLD: In Supine: Short neck, 5 diaphragmatic breaths, Lt inguinal nodes and Lt axillo-inguinal anastomosis focusing on hematoma at lateral breast, some mod deeper fibrotic techniques here briefly ?Therapeutic exercise; educated in scapular retraction with yellow band x 10, IR x 5 and ER with bilateral UEs x 5, updated HEP ? ?PATIENT EDUCATION:  ?Education details: theraband exs SR, IR and ER with yellow ?Person educated: Patient ?Education method: Explanation, Demonstration, Tactile cues, Verbal cues, and Handouts ?Education comprehension: verbalized understanding, returned demonstration, verbal cues required, and needs  further education  ?  ?HOME EXERCISE PROGRAM: ?Supine bil shoulder extension  press into mat 5" x 6 ?            Bil protraction/retraction x 10 ?            Alternating shoulder flexion with focus on setting shoulder blade to decrease impingement ?            Seated retraction ?            Mermaid stretch ? Scapular retraction, IR and ER with yellow band x 5 ea ? ?ASSESSMENT: ?  ?CLINICAL IMPRESSION: ?Continued soft tissue mobilization, PROM of left shoulder and MLD. Was able to advance TE into more impingement exercises. Added taping trial today.  Pt is interested in iontophoresis so I sent over a new POC to Dr. Isidore Moos.  Less pinch overall and seroma  seems softer.    ? ?OBJECTIVE IMPAIRMENTS decreased knowledge of use of DME, decreased ROM, and increased edema.  ?  ?ACTIVITY LIMITATIONS community activity and yard work.  ?  ?PERSONAL FACTORS 1 comorbidity: hematoma complication  are also affecting patient's functional outcome.  ?  ?  ?REHAB POTENTIAL: Excellent ?  ?CLINICAL DECISION MAKING: Stable/uncomplicated ?  ?EVALUATION COMPLEXITY: Low ? ?GOALS: ?Goals reviewed with patient? Yes ?  ?  ?LONG TERM GOALS: Target date: 08/13/2021 ?  ?Pt will improve Lt UE AROM to equal to the Rt UE and without pain ?Baseline:  ?Goal status: INITIAL ?  ?2.  Pt will be ind with self care regarding Lt breast hematoma including self massage, compression bra, and use of foam or swell spot ?Baseline:  ?Goal status: INITIAL ?  ?PLAN: ?PT FREQUENCY: 2x/week ?  ?PT DURATION: 4weeks ?  ?PLANNED INTERVENTIONS: Therapeutic exercises, Patient/Family education, Joint mobilization, DME instructions, Manual lymph drainage, Compression bandaging, scar mobilization, Taping, and Manual therapy, iontophoresis with dexamethasone* ?  ?PLAN FOR NEXT SESSION: how was tape? PROM Lt shoulder, STM, scapular and GH mob, working on increase AROM and decreasing shoulder pain with overhead reach; ionto or tape? ?  ?  ? ? ?Stark Bray, PT ?08/14/2021, 5:07  PM ? ?  ? ?

## 2021-08-19 ENCOUNTER — Encounter: Payer: Self-pay | Admitting: Hematology

## 2021-08-20 ENCOUNTER — Other Ambulatory Visit: Payer: Self-pay

## 2021-08-20 ENCOUNTER — Encounter: Payer: Self-pay | Admitting: Rehabilitation

## 2021-08-20 ENCOUNTER — Ambulatory Visit: Payer: BC Managed Care – PPO | Admitting: Rehabilitation

## 2021-08-20 DIAGNOSIS — D0512 Intraductal carcinoma in situ of left breast: Secondary | ICD-10-CM

## 2021-08-20 DIAGNOSIS — M7981 Nontraumatic hematoma of soft tissue: Secondary | ICD-10-CM

## 2021-08-20 DIAGNOSIS — N6489 Other specified disorders of breast: Secondary | ICD-10-CM

## 2021-08-20 DIAGNOSIS — M25612 Stiffness of left shoulder, not elsewhere classified: Secondary | ICD-10-CM

## 2021-08-20 NOTE — Progress Notes (Signed)
Verbal order for referral to Dr. Sterling Big regarding Beth Stephens conversation between pt and Dr. Burr Medico.  [facility] Referral - office note, pt demographics, pathology report, and referral orders to Dr. Sterling Big [facility] of Hematology [phone]) via Epic.  Epic fax confirmation received.

## 2021-08-20 NOTE — Therapy (Signed)
OUTPATIENT PHYSICAL THERAPY TREATMENT NOTE   Patient Name: Beth Stephens MRN: [id number] DOB:[birth year], 46 y.o., female Today's Date: 08/20/2021  PCP: Janie Morning, DO REFERRING PROVIDER: Eppie Gibson, MD  END OF SESSION:   PT End of Session - 08/20/21 1506     Visit Number 7    Number of Visits 12    Date for PT Re-Evaluation 09/03/21    PT Start Time 3419    PT Stop Time 1600    PT Time Calculation (min) 53 min    Activity Tolerance Patient tolerated treatment well    Behavior During Therapy WFL for tasks assessed/performed                Past Medical History:  Diagnosis Date   Cancer ([place])    Breast Cancer   Colitis    Colitis    Past Surgical History:  Procedure Laterality Date   BREAST LUMPECTOMY WITH RADIOACTIVE SEED LOCALIZATION Left 05/01/2021   Procedure: LEFT BREAST LUMPECTOMY WITH RADIOACTIVE SEED LOCALIZATION;  Surgeon: Stark Klein, MD;  Location: [facility];  Service: General;  Laterality: Left;   COLONOSCOPY     WISDOM TOOTH EXTRACTION     Patient Active Problem List   Diagnosis Date Noted   Iron deficiency anemia due to chronic blood loss 05/24/2021   Genetic testing 04/22/2021   Ductal carcinoma in situ (DCIS) of left breast 04/08/2021    REFERRING DIAG: DCIS Lt breast  THERAPY DIAG:  Ductal carcinoma in situ (DCIS) of left breast  Nontraumatic hematoma of soft tissue  Breast edema  Stiffness of left shoulder, not elsewhere classified  PERTINENT HISTORY: Left lumpectomy 05/01/21 no SLNB with Dr. Barry Dienes.  Large hematoma formation. Radiation completed  PRECAUTIONS: Other: very small lymphedema risk Lt UE and breast due to radiation  SUBJECTIVE:   I couldn't really tell if the tape was helping at all.  It didn't feel like tight enough  PAIN:  Are you having pain? NO -  it is up to 5-6/10 pinch when I lift overhead Pain location: anterior/posterior shoulder Pain description: sharp pinch Aggravating factors: reaching overhead and  rolling on it Relieving factors: rest   OBJECTIVE:  PALPATION: Very hard mandarin orange size hematoma under lateral breast incision, +2 ttp axillary palpation with some puffiness here.  Overall breast not swollen.     OBSERVATIONS / OTHER ASSESSMENTS: 07/22/21: radiated field still red and healing especially inferior to breast, and peeling blister area in the axilla.        07/02/21: healing hematoma Lt lateral breast, puffiness Lt axilla, wearing soft cotton eye clasp front bra which has no compression but pt reports is comfortable and the only fabric that is okay to wear currently.    UPPER EXTREMITY AROM/PROM:   A/PROM RIGHT  07/02/2021     Shoulder extension 53   Shoulder flexion 174   Shoulder abduction 165   Shoulder internal rotation Behind the back to T6   Shoulder external rotation 90                           (Blank rows = not tested)   A/PROM LEFT  07/02/2021 07/22/21 08/13/21  Shoulder extension 54    Shoulder flexion 165 145 - ache top of shoulder  155 - with top of the shoulder pinch  Shoulder abduction 160 155 - ache top of the shoulder  155 - ache top of the shoulder   Shoulder internal rotation Behind the  back to T4    Shoulder external rotation 90                            (Blank rows = not tested)   TODAY'S TREATMENT  08/20/21 Therapeutic exercise;  retraction with yellow band 2x 10, bil extension yellow 2x 10,  Pulleys 1mn flexion and 274m abduction avoiding pinch Attempted yellow bil ER but with pinch - switched to ER walk outs 3# at cable machine.  ER  2 steps x 10 each supine pro/ret  x 10 each with pt able to do this movement for the first time today Sidelying ER AROM x  5 left 2# Manual Therapy: STM Lt UT, anterior/lateral shoulder, pectoralis, and latissimus   Gr. 3/4 GH Posterior and inferior mobs in various positions PROM flexion, scaption abduction with emphasis on scapular depression and avoiding pinch MLD: In Supine: Short neck, 5 diaphragmatic breaths,  Lt inguinal nodes and Lt axillo-inguinal anastomosis focusing on hematoma at lateral breast, some mod deeper fibrotic techniques here briefly Kinesiotape: y strip with anchor on lateral shoulder with 2 strips over shoulder onto scapula and 1 I strip from anterior shoulder over towards scapula performed in retraction position   08/14/21 Therapeutic exercise;  retraction with yellow band x 10, bil extension yellow x 10,  Pulleys [id number] and 72mi67mbduction avoiding pinch Attempted yellow bil ER but with pinch - switched to ER walk outs 3# at cable machine.  ER and IR 2 steps x 10 each Attempted supine pro/ret but unable to isolate/perform - swtiched to small 2# c/cc circles x 10 each Sidelying ER AROM x 8 left Manual Therapy: STM Lt UT, anterior/lateral shoulder, pectoralis, and latissimus   Gr. 3/4 GH Posterior and inferior mobs in various positions PROM flexion, scaption abduction with emphasis on scapular depression and avoiding pinch MLD: In Supine: Short neck, 5 diaphragmatic breaths, Lt inguinal nodes and Lt axillo-inguinal anastomosis focusing on hematoma at lateral breast, some mod deeper fibrotic techniques here briefly Kinesiotape: y strip with anchor on anterior shoulder with 2 strips over shoulder onto scapula    08/13/21: Manual Therapy: STM Lt UT, anterior/lateral shoulder, pectoralis, and latissimus  Sidelying STM Lt scapular region and UT  all with cocoa butter and moderate pressure Gr. 3/4 GH Posterior and inferior mobs in various positions PROM flexion, scaption abduction with emphasis on scapular depression and avoiding pinch Sidelying MWM into abduction to 90deg with lat release x 5 MLD: In Supine: Short neck, 5 diaphragmatic breaths, Lt inguinal nodes and Lt axillo-inguinal anastomosis focusing on hematoma at lateral breast, some mod deeper fibrotic techniques here briefly Therapeutic exercise; retraction with yellow band x 10, bil extension yellow x 10, forearms on  wall protraction/retraction with sig vcs for completion and unable to do in arm straight position.    PATIENT EDUCATION:  Education details: theraband exs SR, IR and ER with yellow Person educated: Patient Education method: Explanation, Demonstration, Tactile cues, Verbal cues, and Handouts Education comprehension: verbalized understanding, returned demonstration, verbal cues required, and needs further education    HOME EXERCISE PROGRAM: Supine bil shoulder extension  press into mat 5" x 6             Bil protraction/retraction x 10             Alternating shoulder flexion with focus on setting shoulder blade to decrease impingement             Seated  retraction             Mermaid stretch  Scapular retraction, IR and ER with yellow band x 5 ea  ASSESSMENT:   CLINICAL IMPRESSION: Less pinch overall and seroma seems softer.  Added foam pad to bra as skin is more healed now.  Also added more tape to see if this helps although it was not sticking very well.  Did not do ionto as pt has no point tenderness today only impingement symptoms.    OBJECTIVE IMPAIRMENTS decreased knowledge of use of DME, decreased ROM, and increased edema.    ACTIVITY LIMITATIONS community activity and yard work.    PERSONAL FACTORS 1 comorbidity: hematoma complication  are also affecting patient's functional outcome.      REHAB POTENTIAL: Excellent   CLINICAL DECISION MAKING: Stable/uncomplicated   EVALUATION COMPLEXITY: Low  GOALS: Goals reviewed with patient? Yes     LONG TERM GOALS: Target date: 08/13/2021   Pt will improve Lt UE AROM to equal to the Rt UE and without pain Baseline:  Goal status: INITIAL   2.  Pt will be ind with self care regarding Lt breast hematoma including self massage, compression bra, and use of foam or swell spot Baseline:  Goal status: INITIAL   PLAN: PT FREQUENCY: 2x/week   PT DURATION: 4weeks   PLANNED INTERVENTIONS: Therapeutic exercises, Patient/Family  education, Joint mobilization, DME instructions, Manual lymph drainage, Compression bandaging, scar mobilization, Taping, and Manual therapy, iontophoresis with dexamethasone*   PLAN FOR NEXT SESSION: PROM Lt shoulder, STM, scapular and GH mob, working on increase AROM and decreasing shoulder pain with overhead reach; ionto or tape?       Stark Bray, PT 08/20/2021, 4:19 PM

## 2021-08-22 ENCOUNTER — Ambulatory Visit: Payer: BC Managed Care – PPO

## 2021-08-22 DIAGNOSIS — M7981 Nontraumatic hematoma of soft tissue: Secondary | ICD-10-CM

## 2021-08-22 DIAGNOSIS — N6489 Other specified disorders of breast: Secondary | ICD-10-CM

## 2021-08-22 DIAGNOSIS — M25612 Stiffness of left shoulder, not elsewhere classified: Secondary | ICD-10-CM

## 2021-08-22 DIAGNOSIS — D0512 Intraductal carcinoma in situ of left breast: Secondary | ICD-10-CM | POA: Diagnosis not present

## 2021-08-22 NOTE — Therapy (Signed)
OUTPATIENT PHYSICAL THERAPY TREATMENT NOTE   Patient Name: Beth Stephens MRN: [id number] DOB:1976-01-18, 46 y.o., female Today's Date: 08/22/2021  PCP: Janie Morning, DO REFERRING PROVIDER: Eppie Gibson, MD  END OF SESSION:   PT End of Session - 08/22/21 1719     Visit Number 8    Number of Visits 12    Date for PT Re-Evaluation 09/03/21    Authorization Type [employer] auth unknown at eval    Authorization - Visit Number 8    Authorization - Number of Visits 30    PT Start Time 1508   late   PT Stop Time 1603    PT Time Calculation (min) 55 min    Activity Tolerance Patient tolerated treatment well    Behavior During Therapy WFL for tasks assessed/performed                 Past Medical History:  Diagnosis Date   Cancer ([place])    Breast Cancer   Colitis    Colitis    Past Surgical History:  Procedure Laterality Date   BREAST LUMPECTOMY WITH RADIOACTIVE SEED LOCALIZATION Left 05/01/2021   Procedure: LEFT BREAST LUMPECTOMY WITH RADIOACTIVE SEED LOCALIZATION;  Surgeon: Stark Klein, MD;  Location: [facility];  Service: General;  Laterality: Left;   COLONOSCOPY     WISDOM TOOTH EXTRACTION     Patient Active Problem List   Diagnosis Date Noted   Iron deficiency anemia due to chronic blood loss 05/24/2021   Genetic testing 04/22/2021   Ductal carcinoma in situ (DCIS) of left breast 04/08/2021    REFERRING DIAG: DCIS Lt breast  THERAPY DIAG:  Ductal carcinoma in situ (DCIS) of left breast  Nontraumatic hematoma of soft tissue  Breast edema  Stiffness of left shoulder, not elsewhere classified  PERTINENT HISTORY: Left lumpectomy 05/01/21 no SLNB with Dr. Barry Dienes.  Large hematoma formation. Radiation completed  PRECAUTIONS: Other: very small lymphedema risk Lt UE and breast due to radiation  SUBJECTIVE:    I think the manual helps the most. The tape doesn't seem to do much.  The pain is mainly in the front but mainly when I reach. I can sleep on my left side some  now. I am using the foam now in my bra and tolerating it.  PAIN:  Are you having pain? NO -  it is up to 5-7/10 pinch when I lift overhead Pain location: anterior/posterior shoulder Pain description: sharp pinch Aggravating factors: reaching overhead and rolling on it Relieving factors: rest   OBJECTIVE:  PALPATION: Very hard mandarin orange size hematoma under lateral breast incision, +2 ttp axillary palpation with some puffiness here.  Overall breast not swollen.     OBSERVATIONS / OTHER ASSESSMENTS: 07/22/21: radiated field still red and healing especially inferior to breast, and peeling blister area in the axilla.        07/02/21: healing hematoma Lt lateral breast, puffiness Lt axilla, wearing soft cotton eye clasp front bra which has no compression but pt reports is comfortable and the only fabric that is okay to wear currently.    UPPER EXTREMITY AROM/PROM:   A/PROM RIGHT  07/02/2021     Shoulder extension 53   Shoulder flexion 174   Shoulder abduction 165   Shoulder internal rotation Behind the back to T6   Shoulder external rotation 90                           (Blank rows =  not tested)   A/PROM LEFT  07/02/2021 07/22/21 08/13/21  Shoulder extension 54    Shoulder flexion 165 145 - ache top of shoulder  155 - with top of the shoulder pinch  Shoulder abduction 160 155 - ache top of the shoulder  155 - ache top of the shoulder   Shoulder internal rotation Behind the back to T4    Shoulder external rotation 90                            (Blank rows = not tested)   TODAY'S TREATMENT  08/22/2021 STM Lt UT, anterior/lateral shoulder, pectoralis, and latissimus  and SL to UT and periscapular area with cocoa butter  Gr. 3/4 GH Posterior and inferior mobs in various positions MLD: In Supine: Short neck, 5 diaphragmatic breaths, Lt inguinal nodes and Lt axillo-inguinal anastomosis focusing on hematoma at lateral breast, some mod deeper fibrotic techniques here briefly Educated pt in  scapular depression and performed sitting D2 scapular extension with manual resistance x 15. TC's, visual cues and VC's required to isolate scapula. Reviewed bilateral standing scapular retraction , extension and ER with yellow band x 10. Multiple VC's for core activation to avoid sway back posture and to adjust tension as needed to help with stability. Performed ER in small ROM without pain. Did not use K tape as pt did not feel it was beneficial.   08/20/21 Therapeutic exercise;  retraction with yellow band 2x 10, bil extension yellow 2x 10,  Pulleys 24min flexion and 71min abduction avoiding pinch Attempted yellow bil ER but with pinch - switched to ER walk outs 3# at cable machine.  ER  2 steps x 10 each supine pro/ret  x 10 each with pt able to do this movement for the first time today Sidelying ER AROM x  5 left 2# Manual Therapy: STM Lt UT, anterior/lateral shoulder, pectoralis, and latissimus   Gr. 3/4 GH Posterior and inferior mobs in various positions PROM flexion, scaption abduction with emphasis on scapular depression and avoiding pinch MLD: In Supine: Short neck, 5 diaphragmatic breaths, Lt inguinal nodes and Lt axillo-inguinal anastomosis focusing on hematoma at lateral breast, some mod deeper fibrotic techniques here briefly Kinesiotape: y strip with anchor on lateral shoulder with 2 strips over shoulder onto scapula and 1 I strip from anterior shoulder over towards scapula performed in retraction position   08/14/21 Therapeutic exercise;  retraction with yellow band x 10, bil extension yellow x 10,  Pulleys 55min flexion and 72min abduction avoiding pinch Attempted yellow bil ER but with pinch - switched to ER walk outs 3# at cable machine.  ER and IR 2 steps x 10 each Attempted supine pro/ret but unable to isolate/perform - swtiched to small 2# c/cc circles x 10 each Sidelying ER AROM x 8 left Manual Therapy: STM Lt UT, anterior/lateral shoulder, pectoralis, and latissimus    Gr. 3/4 GH Posterior and inferior mobs in various positions PROM flexion, scaption abduction with emphasis on scapular depression and avoiding pinch MLD: In Supine: Short neck, 5 diaphragmatic breaths, Lt inguinal nodes and Lt axillo-inguinal anastomosis focusing on hematoma at lateral breast, some mod deeper fibrotic techniques here briefly Kinesiotape: y strip with anchor on anterior shoulder with 2 strips over shoulder onto scapula    08/13/21: Manual Therapy: STM Lt UT, anterior/lateral shoulder, pectoralis, and latissimus  Sidelying STM Lt scapular region and UT  all with cocoa butter and moderate pressure Gr. 3/4  [place] Posterior and inferior mobs in various positions PROM flexion, scaption abduction with emphasis on scapular depression and avoiding pinch Sidelying MWM into abduction to 90deg with lat release x 5 MLD: In Supine: Short neck, 5 diaphragmatic breaths, Lt inguinal nodes and Lt axillo-inguinal anastomosis focusing on hematoma at lateral breast, some mod deeper fibrotic techniques here briefly Therapeutic exercise; retraction with yellow band x 10, bil extension yellow x 10, forearms on wall protraction/retraction with sig vcs for completion and unable to do in arm straight position.    PATIENT EDUCATION:  Education details: theraband exs SR, IR and ER with yellow Person educated: Patient Education method: Explanation, Demonstration, Tactile cues, Verbal cues, and Handouts Education comprehension: verbalized understanding, returned demonstration, verbal cues required, and needs further education    HOME EXERCISE PROGRAM: Supine bil shoulder extension  press into mat 5" x 6             Bil protraction/retraction x 10             Alternating shoulder flexion with focus on setting shoulder blade to decrease impingement             Seated retraction             Mermaid stretch  Scapular retraction, IR and ER with yellow band x 5 ea  ASSESSMENT:   CLINICAL IMPRESSION: No real  improvement with K tape so not performed today. Very little tenderness/tightness noted today with soft tissue mobilization.  Practiced exercises with emphasis on proper posture and core stabilization. Pt had difficulty but improved with repetition. Seroma still present but does soften some with manual work. Still no tenderness noted around shoulder that would benefit from [place].     OBJECTIVE IMPAIRMENTS decreased knowledge of use of DME, decreased ROM, and increased edema.    ACTIVITY LIMITATIONS community activity and yard work.    PERSONAL FACTORS 1 comorbidity: hematoma complication  are also affecting patient's functional outcome.      REHAB POTENTIAL: Excellent   CLINICAL DECISION MAKING: Stable/uncomplicated   EVALUATION COMPLEXITY: Low  GOALS: Goals reviewed with patient? Yes     LONG TERM GOALS: Target date: 08/13/2021   Pt will improve Lt UE AROM to equal to the Rt UE and without pain Baseline:  Goal status: INITIAL   2.  Pt will be ind with self care regarding Lt breast hematoma including self massage, compression bra, and use of foam or swell spot Baseline:  Goal status: INITIAL   PLAN: PT FREQUENCY: 2x/week   PT DURATION: 4weeks   PLANNED INTERVENTIONS: Therapeutic exercises, Patient/Family education, Joint mobilization, DME instructions, Manual lymph drainage, Compression bandaging, scar mobilization, Taping, and Manual therapy, iontophoresis with dexamethasone*   PLAN FOR NEXT SESSION: PROM Lt shoulder, STM, scapular and GH mob, working on increase AROM and decreasing shoulder pain with overhead reach; ionto or tape?       Claris Pong, PT 08/22/2021, 5:21 PM

## 2021-08-23 ENCOUNTER — Ambulatory Visit: Payer: BC Managed Care – PPO | Admitting: Radiation Oncology

## 2021-08-27 ENCOUNTER — Encounter: Payer: Self-pay | Admitting: Rehabilitation

## 2021-08-27 ENCOUNTER — Ambulatory Visit: Payer: BC Managed Care – PPO | Admitting: Rehabilitation

## 2021-08-27 DIAGNOSIS — M7981 Nontraumatic hematoma of soft tissue: Secondary | ICD-10-CM

## 2021-08-27 DIAGNOSIS — D0512 Intraductal carcinoma in situ of left breast: Secondary | ICD-10-CM | POA: Diagnosis not present

## 2021-08-27 DIAGNOSIS — N6489 Other specified disorders of breast: Secondary | ICD-10-CM

## 2021-08-27 DIAGNOSIS — M25612 Stiffness of left shoulder, not elsewhere classified: Secondary | ICD-10-CM

## 2021-08-27 NOTE — Therapy (Signed)
OUTPATIENT PHYSICAL THERAPY TREATMENT NOTE   Patient Name: Beth Stephens MRN: [id number] DOB:1975/04/24, 46 y.o., female Today's Date: 08/27/2021  PCP: Janie Morning, DO REFERRING PROVIDER: Eppie Gibson, MD  END OF SESSION:   PT End of Session - 08/27/21 1512     Visit Number 9    Number of Visits 12    Date for PT Re-Evaluation 09/03/21    PT Start Time 1513   late   PT Stop Time 1558    PT Time Calculation (min) 45 min    Activity Tolerance Patient tolerated treatment well    Behavior During Therapy WFL for tasks assessed/performed                  Past Medical History:  Diagnosis Date   Cancer ([place])    Breast Cancer   Colitis    Colitis    Past Surgical History:  Procedure Laterality Date   BREAST LUMPECTOMY WITH RADIOACTIVE SEED LOCALIZATION Left 05/01/2021   Procedure: LEFT BREAST LUMPECTOMY WITH RADIOACTIVE SEED LOCALIZATION;  Surgeon: Stark Klein, MD;  Location: [facility];  Service: General;  Laterality: Left;   COLONOSCOPY     WISDOM TOOTH EXTRACTION     Patient Active Problem List   Diagnosis Date Noted   Iron deficiency anemia due to chronic blood loss 05/24/2021   Genetic testing 04/22/2021   Ductal carcinoma in situ (DCIS) of left breast 04/08/2021    REFERRING DIAG: DCIS Lt breast  THERAPY DIAG:  Ductal carcinoma in situ (DCIS) of left breast  Nontraumatic hematoma of soft tissue  Breast edema  Stiffness of left shoulder, not elsewhere classified  PERTINENT HISTORY: Left lumpectomy 05/01/21 no SLNB with Dr. Barry Dienes.  Large hematoma formation. Radiation completed  PRECAUTIONS: Other: very small lymphedema risk Lt UE and breast due to radiation  SUBJECTIVE:     PAIN:  Are you having pain? NO -  it is up to 5-7/10 pinch when I lift overhead Pain location: anterior/posterior shoulder Pain description: sharp pinch Aggravating factors: reaching overhead and rolling on it Relieving factors: rest   OBJECTIVE:  PALPATION: Very hard  mandarin orange size hematoma under lateral breast incision, +2 ttp axillary palpation with some puffiness here.  Overall breast not swollen.     OBSERVATIONS / OTHER ASSESSMENTS: 07/22/21: radiated field still red and healing especially inferior to breast, and peeling blister area in the axilla.        07/02/21: healing hematoma Lt lateral breast, puffiness Lt axilla, wearing soft cotton eye clasp front bra which has no compression but pt reports is comfortable and the only fabric that is okay to wear currently.    UPPER EXTREMITY AROM/PROM:   A/PROM RIGHT  07/02/2021     Shoulder extension 53   Shoulder flexion 174   Shoulder abduction 165   Shoulder internal rotation Behind the back to T6   Shoulder external rotation 90                           (Blank rows = not tested)   A/PROM LEFT  07/02/2021 07/22/21 08/13/21  Shoulder extension 54    Shoulder flexion 165 145 - ache top of shoulder  155 - with top of the shoulder pinch  Shoulder abduction 160 155 - ache top of the shoulder  155 - ache top of the shoulder   Shoulder internal rotation Behind the back to T4    Shoulder external rotation 90                            (  Blank rows = not tested)   TODAY'S TREATMENT  08/27/21 Manual Therapy: STM Lt UT, anterior/lateral shoulder, pectoralis, and latissimus, supraspinatus, and levator scapulae with TPR at levator today with referred pain here Gr. 3/4 GH Posterior and inferior mobs in various positions PROM flexion, scaption abduction with emphasis on scapular depression and avoiding pinch MLD: In Supine: Short neck, 5 diaphragmatic breaths, Lt inguinal nodes and Lt axillo-inguinal anastomosis focusing on hematoma at lateral breast, some mod deeper fibrotic techniques here briefly    08/22/2021 STM Lt UT, anterior/lateral shoulder, pectoralis, and latissimus  and SL to UT and periscapular area with cocoa butter  Gr. 3/4 GH Posterior and inferior mobs in various positions MLD: In Supine: Short  neck, 5 diaphragmatic breaths, Lt inguinal nodes and Lt axillo-inguinal anastomosis focusing on hematoma at lateral breast, some mod deeper fibrotic techniques here briefly Educated pt in scapular depression and performed sitting D2 scapular extension with manual resistance x 15. TC's, visual cues and VC's required to isolate scapula. Reviewed bilateral standing scapular retraction , extension and ER with yellow band x 10. Multiple VC's for core activation to avoid sway back posture and to adjust tension as needed to help with stability. Performed ER in small ROM without pain. Did not use K tape as pt did not feel it was beneficial.   08/20/21 Therapeutic exercise;  retraction with yellow band 2x 10, bil extension yellow 2x 10,  Pulleys 33mn flexion and 2108m abduction avoiding pinch Attempted yellow bil ER but with pinch - switched to ER walk outs 3# at cable machine.  ER  2 steps x 10 each supine pro/ret  x 10 each with pt able to do this movement for the first time today Sidelying ER AROM x  5 left 2# Manual Therapy: STM Lt UT, anterior/lateral shoulder, pectoralis, and latissimus   Gr. 3/4 GH Posterior and inferior mobs in various positions PROM flexion, scaption abduction with emphasis on scapular depression and avoiding pinch MLD: In Supine: Short neck, 5 diaphragmatic breaths, Lt inguinal nodes and Lt axillo-inguinal anastomosis focusing on hematoma at lateral breast, some mod deeper fibrotic techniques here briefly Kinesiotape: y strip with anchor on lateral shoulder with 2 strips over shoulder onto scapula and 1 I strip from anterior shoulder over towards scapula performed in retraction position   PATIENT EDUCATION:  Education details: theraband exs SR, IR and ER with yellow Person educated: Patient Education method: Explanation, Demonstration, Tactile cues, Verbal cues, and Handouts Education comprehension: verbalized understanding, returned demonstration, verbal cues required, and  needs further education    HOME EXERCISE PROGRAM: Supine bil shoulder extension  press into mat 5" x 6             Bil protraction/retraction x 10             Alternating shoulder flexion with focus on setting shoulder blade to decrease impingement             Seated retraction             Mermaid stretch  Scapular retraction, IR and ER with yellow band x 5 ea  ASSESSMENT:   CLINICAL IMPRESSION: Focused on MT today as pt has 2 days in a row.  Some trigger point pain in the levator scapulae today decreased with MT.  Pt may have 1 more visit as she is possibly changing jobs.       OBJECTIVE IMPAIRMENTS decreased knowledge of use of DME, decreased ROM, and increased edema.    ACTIVITY  LIMITATIONS community activity and yard work.    PERSONAL FACTORS 1 comorbidity: hematoma complication  are also affecting patient's functional outcome.      REHAB POTENTIAL: Excellent   CLINICAL DECISION MAKING: Stable/uncomplicated   EVALUATION COMPLEXITY: Low  GOALS: Goals reviewed with patient? Yes     LONG TERM GOALS: Target date: 08/13/2021   Pt will improve Lt UE AROM to equal to the Rt UE and without pain Baseline:  Goal status: INITIAL   2.  Pt will be ind with self care regarding Lt breast hematoma including self massage, compression bra, and use of foam or swell spot Baseline:  Goal status: INITIAL   PLAN: PT FREQUENCY: 2x/week   PT DURATION: 4weeks   PLANNED INTERVENTIONS: Therapeutic exercises, Patient/Family education, Joint mobilization, DME instructions, Manual lymph drainage, Compression bandaging, scar mobilization, Taping, and Manual therapy, iontophoresis with dexamethasone*   PLAN FOR NEXT SESSION: PROM Lt shoulder, STM, scapular and GH mob, working on increase AROM and decreasing shoulder pain with overhead reach; ionto or tape?       Stark Bray, PT 08/27/2021, 3:59 PM

## 2021-08-28 ENCOUNTER — Encounter: Payer: Self-pay | Admitting: Rehabilitation

## 2021-08-28 ENCOUNTER — Ambulatory Visit: Payer: BC Managed Care – PPO | Admitting: Rehabilitation

## 2021-08-28 DIAGNOSIS — N6489 Other specified disorders of breast: Secondary | ICD-10-CM

## 2021-08-28 DIAGNOSIS — M7981 Nontraumatic hematoma of soft tissue: Secondary | ICD-10-CM

## 2021-08-28 DIAGNOSIS — M25612 Stiffness of left shoulder, not elsewhere classified: Secondary | ICD-10-CM

## 2021-08-28 DIAGNOSIS — D0512 Intraductal carcinoma in situ of left breast: Secondary | ICD-10-CM | POA: Diagnosis not present

## 2021-08-28 NOTE — Therapy (Signed)
OUTPATIENT PHYSICAL THERAPY TREATMENT NOTE   Patient Name: Beth Stephens MRN: [id number] DOB:March 08, 1976, 46 y.o., female Today's Date: 08/28/2021  PCP: Janie Morning, DO REFERRING PROVIDER: Eppie Gibson, MD  END OF SESSION:   PT End of Session - 08/28/21 1521     Visit Number 10    Number of Visits 12    Date for PT Re-Evaluation 09/03/21    Authorization - Visit Number 9    Authorization - Number of Visits 65    PT Start Time 1522   late   PT Stop Time 1602    PT Time Calculation (min) 40 min    Activity Tolerance Patient tolerated treatment well    Behavior During Therapy WFL for tasks assessed/performed                   Past Medical History:  Diagnosis Date   Cancer ([place])    Breast Cancer   Colitis    Colitis    Past Surgical History:  Procedure Laterality Date   BREAST LUMPECTOMY WITH RADIOACTIVE SEED LOCALIZATION Left 05/01/2021   Procedure: LEFT BREAST LUMPECTOMY WITH RADIOACTIVE SEED LOCALIZATION;  Surgeon: Stark Klein, MD;  Location: [facility];  Service: General;  Laterality: Left;   COLONOSCOPY     WISDOM TOOTH EXTRACTION     Patient Active Problem List   Diagnosis Date Noted   Iron deficiency anemia due to chronic blood loss 05/24/2021   Genetic testing 04/22/2021   Ductal carcinoma in situ (DCIS) of left breast 04/08/2021    REFERRING DIAG: DCIS Lt breast  THERAPY DIAG:  Ductal carcinoma in situ (DCIS) of left breast  Nontraumatic hematoma of soft tissue  Breast edema  Stiffness of left shoulder, not elsewhere classified  PERTINENT HISTORY: Left lumpectomy 05/01/21 no SLNB with Dr. Barry Dienes.  Large hematoma formation. Radiation completed  PRECAUTIONS: Other: very small lymphedema risk Lt UE and breast due to radiation  SUBJECTIVE:   it was my last day of work today at [employer].  I will be most likely going to the [place]. It is most likely my last day until my new insurance starts.   PAIN:  Are you having pain? NO -  it is up to  4/10 pinch when I lift overhead (from 7/10) Pain location: anterior/posterior shoulder Pain description: sharp pinch Aggravating factors: reaching overhead and rolling on it Relieving factors: rest   OBJECTIVE:  PALPATION: Very hard mandarin orange size hematoma under lateral breast incision, +2 ttp axillary palpation with some puffiness here.  Overall breast not swollen.     OBSERVATIONS / OTHER ASSESSMENTS: 07/22/21: radiated field still red and healing especially inferior to breast, and peeling blister area in the axilla.        07/02/21: healing hematoma Lt lateral breast, puffiness Lt axilla, wearing soft cotton eye clasp front bra which has no compression but pt reports is comfortable and the only fabric that is okay to wear currently.    UPPER EXTREMITY AROM/PROM:   A/PROM RIGHT  07/02/2021     Shoulder extension 53   Shoulder flexion 174   Shoulder abduction 165   Shoulder internal rotation Behind the back to T6   Shoulder external rotation 90                           (Blank rows = not tested)   A/PROM LEFT  07/02/2021 07/22/21 08/13/21 08/28/21  Shoulder extension 54     Shoulder  flexion 165 145 - ache top of shoulder  155 - with top of the shoulder pinch 155 - no pinch   Shoulder abduction 160 155 - ache top of the shoulder  155 - ache top of the shoulder  167- no pinch  Shoulder internal rotation Behind the back to T4     Shoulder external rotation 90                             (Blank rows = not tested)   TODAY'S TREATMENT  08/28/21 Reviewed final HEP in case of this being the last day.  Performed each per below without sig vcs needed now for completion.  STM Lt UT, anterior/lateral shoulder, pectoralis, and latissimus, supraspinatus, and levator scapulae with TPR at levator today with referred pain here Gr. 3/4 GH Posterior and inferior mobs in various positions PROM flexion, scaption abduction with emphasis on scapular depression and avoiding pinch  08/27/21 Manual  Therapy: STM Lt UT, anterior/lateral shoulder, pectoralis, and latissimus, supraspinatus, and levator scapulae with TPR at levator today with referred pain here Gr. 3/4 GH Posterior and inferior mobs in various positions PROM flexion, scaption abduction with emphasis on scapular depression and avoiding pinch MLD: In Supine: Short neck, 5 diaphragmatic breaths, Lt inguinal nodes and Lt axillo-inguinal anastomosis focusing on hematoma at lateral breast, some mod deeper fibrotic techniques here briefly  08/22/2021 STM Lt UT, anterior/lateral shoulder, pectoralis, and latissimus  and SL to UT and periscapular area with cocoa butter  Gr. 3/4 GH Posterior and inferior mobs in various positions MLD: In Supine: Short neck, 5 diaphragmatic breaths, Lt inguinal nodes and Lt axillo-inguinal anastomosis focusing on hematoma at lateral breast, some mod deeper fibrotic techniques here briefly Educated pt in scapular depression and performed sitting D2 scapular extension with manual resistance x 15. TC's, visual cues and VC's required to isolate scapula. Reviewed bilateral standing scapular retraction , extension and ER with yellow band x 10. Multiple VC's for core activation to avoid sway back posture and to adjust tension as needed to help with stability. Performed ER in small ROM without pain. Did not use K tape as pt did not feel it was beneficial.  PATIENT EDUCATION:  Education details: theraband exs SR, IR and ER with yellow Person educated: Patient Education method: Explanation, Demonstration, Tactile cues, Verbal cues, and Handouts Education comprehension: verbalized understanding, returned demonstration, verbal cues required, and needs further education    HOME EXERCISE PROGRAM: Access Code: [email or web address] URL: [email or web address] Date: 08/28/2021 Prepared by: Shan Levans  Exercises - Serratus Forearm Push Up Plus at Wall with Elbows - 1 x daily - 3 x weekly - 1 sets - 10 reps - no  hold - Standing Shoulder Row with Anchored Resistance - 1 x daily - 3 x weekly - 1-3 sets - 10 reps - 3sec hold - Shoulder extension with resistance - Neutral - 1 x daily - 3 x weekly - 1-3 sets - 10 reps - 3sec hold - Standing Shoulder External Rotation with Resistance - 1 x daily - 3 x weekly - 1-3 sets - 10 reps - 3sec hold - Single Arm Doorway Pec Stretch at 90 Degrees Abduction - 1-2 x daily - 7 x weekly - 2 reps - 30-60sec hold  ASSESSMENT:   CLINICAL IMPRESSION: Possible last day due to insurance change but may do her 1 more last visit.  Reviewed final HEP and continued with MT today.  Same trigger point  pain in the levator scapulae today decreased compared to yesterday but still present and decreased with MT.  Pt may have 1 more visit as she is possibly changing jobs.       OBJECTIVE IMPAIRMENTS decreased knowledge of use of DME, decreased ROM, and increased edema.    ACTIVITY LIMITATIONS community activity and yard work.    PERSONAL FACTORS 1 comorbidity: hematoma complication  are also affecting patient's functional outcome.      REHAB POTENTIAL: Excellent   CLINICAL DECISION MAKING: Stable/uncomplicated   EVALUATION COMPLEXITY: Low  GOALS: Goals reviewed with patient? Yes     LONG TERM GOALS: Target date: 08/13/2021   Pt will improve Lt UE AROM to equal to the Rt UE and without pain Baseline:  Goal status: INITIAL   2.  Pt will be ind with self care regarding Lt breast hematoma including self massage, compression bra, and use of foam or swell spot Baseline:  Goal status: INITIAL   PLAN: PT FREQUENCY: 2x/week   PT DURATION: 4weeks   PLANNED INTERVENTIONS: Therapeutic exercises, Patient/Family education, Joint mobilization, DME instructions, Manual lymph drainage, Compression bandaging, scar mobilization, Taping, and Manual therapy, iontophoresis with dexamethasone*   PLAN FOR NEXT SESSION: PROM Lt shoulder, STM, scapular and GH mob, working on increase AROM and  decreasing shoulder pain with overhead reach; ionto or tape?       Stark Bray, PT 08/28/2021, 5:08 PM

## 2021-09-03 ENCOUNTER — Ambulatory Visit: Payer: BC Managed Care – PPO

## 2021-09-04 ENCOUNTER — Encounter: Payer: Self-pay | Admitting: Hematology

## 2021-09-04 NOTE — Progress Notes (Signed)
                                                                                                                                                             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  Cancer Staging:  Cancer Staging  Ductal carcinoma in situ (DCIS) of left breast Staging form: Breast, AJCC 8th Edition - Clinical stage from 04/02/2021: Stage 0 (cTis (DCIS), cN0, cM0, G3, ER+, PR+, HER2: Not Assessed) - Signed by Truitt Merle, MD on 04/09/2021 Stage prefix: Initial diagnosis Histologic grading system: 3 grade system - Pathologic stage from 05/01/2021: Stage Unknown (pTis (DCIS), pNX, cM0, G2, ER+, PR+, HER2: Not Assessed) - Signed by Truitt Merle, MD on 05/23/2021 Stage prefix: Initial diagnosis Histologic grading system: 3 grade system Residual tumor (R): R0 - None  S/p lumpectomy: Stage 0, Left breast, Intermediate grade DCIS, ER+/PR+/Her2 not assessed  Intent: Curative  Radiation Treatment Dates: 06/24/2021 through 07/15/2021 Site Technique Total Dose (Gy) Dose per Fx (Gy) Completed Fx Beam Energies  Breast, Left: Breast_L 3D 42.56/42.56 2.66 16/16 6X   Narrative: The patient tolerated radiation therapy relatively well.   Plan: The patient will follow-up with radiation oncology in 47mo and/or prn.  -----------------------------------  Eppie Gibson, MD

## 2021-10-08 ENCOUNTER — Encounter: Payer: Self-pay | Admitting: Hematology

## 2021-10-14 ENCOUNTER — Encounter: Payer: Self-pay | Admitting: *Deleted

## 2021-10-15 ENCOUNTER — Inpatient Hospital Stay: Payer: 59

## 2021-10-15 ENCOUNTER — Telehealth: Payer: Self-pay | Admitting: Hematology

## 2021-10-15 ENCOUNTER — Inpatient Hospital Stay: Payer: 59 | Admitting: Nurse Practitioner

## 2021-10-15 ENCOUNTER — Other Ambulatory Visit: Payer: Self-pay

## 2021-10-15 DIAGNOSIS — D0512 Intraductal carcinoma in situ of left breast: Secondary | ICD-10-CM

## 2021-10-15 DIAGNOSIS — D500 Iron deficiency anemia secondary to blood loss (chronic): Secondary | ICD-10-CM

## 2021-10-15 NOTE — Telephone Encounter (Signed)
.  Called patient to schedule appointment per 7/18inbasket, patient is aware of date and time.   

## 2021-10-28 ENCOUNTER — Telehealth: Payer: Self-pay | Admitting: Nurse Practitioner

## 2021-10-28 NOTE — Telephone Encounter (Signed)
Per 7/31 phone call pt called to r/s appointment.  R/s per pt request date picked by pt ( can only do Mondays)

## 2021-11-04 ENCOUNTER — Ambulatory Visit: Payer: 59 | Attending: Radiation Oncology | Admitting: Physical Therapy

## 2021-11-04 ENCOUNTER — Encounter: Payer: Self-pay | Admitting: Physical Therapy

## 2021-11-04 ENCOUNTER — Encounter: Payer: Self-pay | Admitting: Hematology

## 2021-11-04 DIAGNOSIS — M7981 Nontraumatic hematoma of soft tissue: Secondary | ICD-10-CM | POA: Diagnosis present

## 2021-11-04 DIAGNOSIS — N6489 Other specified disorders of breast: Secondary | ICD-10-CM | POA: Insufficient documentation

## 2021-11-04 DIAGNOSIS — M25612 Stiffness of left shoulder, not elsewhere classified: Secondary | ICD-10-CM | POA: Diagnosis present

## 2021-11-04 DIAGNOSIS — D0512 Intraductal carcinoma in situ of left breast: Secondary | ICD-10-CM | POA: Insufficient documentation

## 2021-11-04 NOTE — Patient Instructions (Signed)
Over Head Pull: Narrow and Wide Grip   Cancer Rehab [phone]   On back, knees bent, feet flat, band across thighs, elbows straight but relaxed. Pull hands apart (start). Keeping elbows straight, bring arms up and over head, hands toward floor. Keep pull steady on band. Hold momentarily. Return slowly, keeping pull steady, back to start. Then do same with a wider grip on the band (past shoulder width) Repeat _5-10__ times. Band color __yellow____   Side Pull: Double Arm   On back, knees bent, feet flat. Arms perpendicular to body, shoulder level, elbows straight but relaxed. Pull arms out to sides, elbows straight. Resistance band comes across collarbones, hands toward floor. Hold momentarily. Slowly return to starting position. Repeat _5-10__ times. Band color _yellow____   Sword   On back, knees bent, feet flat, left hand on left hip, right hand above left. Pull right arm DIAGONALLY (hip to shoulder) across chest. Bring right arm along head toward floor. Thumb is pointed down when by hip and rotates to point towards the floor when by head. Hold momentarily. Slowly return to starting position. Repeat _5-10__ times. Do with left arm. Band color _yellow_____   Shoulder Rotation: Double Arm   On back, knees bent, feet flat, elbows tucked at sides, bent 90, hands palms up. Pull hands apart and down toward floor, keeping elbows near sides. Hold momentarily. Slowly return to starting position. Repeat _5-10__ times. Band color __yellow____

## 2021-11-04 NOTE — Therapy (Signed)
OUTPATIENT PHYSICAL THERAPY TREATMENT NOTE   Patient Name: Beth Stephens MRN: [id number] DOB:Jan 29, 1976, 45 y.o., female Today's Date: 11/04/2021  PCP: Janie Morning, DO REFERRING PROVIDER: Eppie Gibson, MD  END OF SESSION:   PT End of Session - 11/04/21 1409     Visit Number 11    Number of Visits 17    Date for PT Re-Evaluation 12/16/21    PT Start Time 1408    PT Stop Time 1450    PT Time Calculation (min) 42 min    Activity Tolerance Patient tolerated treatment well    Behavior During Therapy WFL for tasks assessed/performed                   Past Medical History:  Diagnosis Date   Cancer ([place])    Breast Cancer   Colitis    Colitis    Past Surgical History:  Procedure Laterality Date   BREAST LUMPECTOMY WITH RADIOACTIVE SEED LOCALIZATION Left 05/01/2021   Procedure: LEFT BREAST LUMPECTOMY WITH RADIOACTIVE SEED LOCALIZATION;  Surgeon: Stark Klein, MD;  Location: [place];  Service: General;  Laterality: Left;   COLONOSCOPY     WISDOM TOOTH EXTRACTION     Patient Active Problem List   Diagnosis Date Noted   Iron deficiency anemia due to chronic blood loss 05/24/2021   Genetic testing 04/22/2021   Ductal carcinoma in situ (DCIS) of left breast 04/08/2021    REFERRING DIAG: DCIS Lt breast  THERAPY DIAG:  Stiffness of left shoulder, not elsewhere classified  Breast edema  Nontraumatic hematoma of soft tissue  Ductal carcinoma in situ (DCIS) of left breast  PERTINENT HISTORY: Left lumpectomy 05/01/21 no SLNB with Dr. Barry Dienes.  Large hematoma formation. Radiation completed  PRECAUTIONS: Other: very small lymphedema risk Lt UE and breast due to radiation  SUBJECTIVE:  My shoulder pain got better but then got worse. I have a 3 hour commute every week and I have been sleeping on that side more. I have also been using the arm more.   PAIN:  Are you having pain? NO -  it is up to 6/10 during abduction with extension Pain location: anterior/posterior  shoulder Pain description: sharp  Aggravating factors: sleeping on it Relieving factors: rest   OBJECTIVE:  PALPATION: Very hard mandarin orange size hematoma under lateral breast incision, +2 ttp axillary palpation with some puffiness here.  Overall breast not swollen.     OBSERVATIONS / OTHER ASSESSMENTS: 07/22/21: radiated field still red and healing especially inferior to breast, and peeling blister area in the axilla.        07/02/21: healing hematoma Lt lateral breast, puffiness Lt axilla, wearing soft cotton eye clasp front bra which has no compression but pt reports is comfortable and the only fabric that is okay to wear currently. 11/04/21- hematoma still firm and palpable in left lateral breast, educated pt to try and sleep in her compression bra   UPPER EXTREMITY AROM/PROM:   A/PROM RIGHT  07/02/2021     Shoulder extension 53   Shoulder flexion 174   Shoulder abduction 165   Shoulder internal rotation Behind the back to T6   Shoulder external rotation 90                           (Blank rows = not tested)   A/PROM LEFT  07/02/2021 07/22/21 08/13/21 08/28/21 11/04/21  Shoulder extension 54    66  Shoulder flexion 165 145 - ache  top of shoulder  155 - with top of the shoulder pinch 155 - no pinch  151 - feels pain coming on  Shoulder abduction 160 155 - ache top of the shoulder  155 - ache top of the shoulder  167- no pinch 150 - feels pain coming on  Shoulder internal rotation Behind the back to T4      Shoulder external rotation 90                              (Blank rows = not tested)   TODAY'S TREATMENT  11/04/21 Instructed pt in supine scapular series with yellow theraband as follows: narrow and wide grip flexion x 5 each, horizontal abduction x 5, ER x 5, diagonals x 5 and issued instructions for pt to continue with these at home STM to L lats and serratus and briefly to rhomboids PROM to L shoulder while providing manual pressure to help improve scapular  depression  08/28/21 Reviewed final HEP in case of this being the last day.  Performed each per below without sig vcs needed now for completion.  STM Lt UT, anterior/lateral shoulder, pectoralis, and latissimus, supraspinatus, and levator scapulae with TPR at levator today with referred pain here Gr. 3/4 GH Posterior and inferior mobs in various positions PROM flexion, scaption abduction with emphasis on scapular depression and avoiding pinch  08/27/21 Manual Therapy: STM Lt UT, anterior/lateral shoulder, pectoralis, and latissimus, supraspinatus, and levator scapulae with TPR at levator today with referred pain here Gr. 3/4 GH Posterior and inferior mobs in various positions PROM flexion, scaption abduction with emphasis on scapular depression and avoiding pinch MLD: In Supine: Short neck, 5 diaphragmatic breaths, Lt inguinal nodes and Lt axillo-inguinal anastomosis focusing on hematoma at lateral breast, some mod deeper fibrotic techniques here briefly  08/22/2021 STM Lt UT, anterior/lateral shoulder, pectoralis, and latissimus  and SL to UT and periscapular area with cocoa butter  Gr. 3/4 GH Posterior and inferior mobs in various positions MLD: In Supine: Short neck, 5 diaphragmatic breaths, Lt inguinal nodes and Lt axillo-inguinal anastomosis focusing on hematoma at lateral breast, some mod deeper fibrotic techniques here briefly Educated pt in scapular depression and performed sitting D2 scapular extension with manual resistance x 15. TC's, visual cues and VC's required to isolate scapula. Reviewed bilateral standing scapular retraction , extension and ER with yellow band x 10. Multiple VC's for core activation to avoid sway back posture and to adjust tension as needed to help with stability. Performed ER in small ROM without pain. Did not use K tape as pt did not feel it was beneficial.  PATIENT EDUCATION:  Education details: theraband exs SR, IR and ER with yellow Person educated:  Patient Education method: Explanation, Demonstration, Tactile cues, Verbal cues, and Handouts Education comprehension: verbalized understanding, returned demonstration, verbal cues required, and needs further education    HOME EXERCISE PROGRAM: Access Code: [email or web address] URL: [email or web address] Date: 08/28/2021 Prepared by: Shan Levans  Exercises - Serratus Forearm Push Up Plus at Wall with Elbows - 1 x daily - 3 x weekly - 1 sets - 10 reps - no hold - Standing Shoulder Row with Anchored Resistance - 1 x daily - 3 x weekly - 1-3 sets - 10 reps - 3sec hold - Shoulder extension with resistance - Neutral - 1 x daily - 3 x weekly - 1-3 sets - 10 reps - 3sec hold - Standing Shoulder External Rotation with Resistance -  1 x daily - 3 x weekly - 1-3 sets - 10 reps - 3sec hold - Single Arm Doorway Pec Stretch at 90 Degrees Abduction - 1-2 x daily - 7 x weekly - 2 reps - 30-60sec hold  ASSESSMENT:   CLINICAL IMPRESSION: Pt has changed jobs and returns with new insurance. She reports her pain had been better in her left shoulder but recently has begun bothering her again especially when she lays on it at night. Educated pt to wear her compression bra at night to see if this allows her to sleep more comfortably on her side without the need to extend her L arm when sleeping on her L side. Assessed pt's progress towards goals in therapy and added goal. Pt continues to demonstrate firmness in area of her hematoma. She would benefit from continued skilled PT services to improve L shoulder ROM, decrease pain and decrease her hematoma.      OBJECTIVE IMPAIRMENTS decreased knowledge of use of DME, decreased ROM, and increased edema.    ACTIVITY LIMITATIONS community activity and yard work.    PERSONAL FACTORS 1 comorbidity: hematoma complication  are also affecting patient's functional outcome.      REHAB POTENTIAL: Excellent   CLINICAL DECISION MAKING: Stable/uncomplicated   EVALUATION  COMPLEXITY: Low  GOALS: Goals reviewed with patient? Yes     LONG TERM GOALS: Target date: 08/13/2021   Pt will improve Lt UE AROM to equal to the Rt UE and without pain Baseline:  Goal status: ONGOING   2.  Pt will be ind with self care regarding Lt breast hematoma including self massage, compression bra, and use of foam or swell spot Baseline:  Goal status: ONGOING - pt reports it has softened but it is still there  3. Pt will not demonstrate shooting breast  pains to allow improved comfort.  Baseline:  Goals status: INITIAL   PLAN: PT FREQUENCY: 2x/week   PT DURATION: 4weeks   PLANNED INTERVENTIONS: Therapeutic exercises, Patient/Family education, Joint mobilization, DME instructions, Manual lymph drainage, Compression bandaging, scar mobilization, Taping, and Manual therapy, iontophoresis with dexamethasone*   PLAN FOR NEXT SESSION: PROM Lt shoulder, STM, scapular and GH mob, working on increase AROM and decreasing shoulder pain with overhead reach; ionto or tape?       [facility] [place], PT 11/04/2021, 2:53 PM

## 2021-11-11 ENCOUNTER — Ambulatory Visit: Payer: 59

## 2021-11-11 DIAGNOSIS — M25612 Stiffness of left shoulder, not elsewhere classified: Secondary | ICD-10-CM

## 2021-11-11 DIAGNOSIS — D0512 Intraductal carcinoma in situ of left breast: Secondary | ICD-10-CM

## 2021-11-11 DIAGNOSIS — M7981 Nontraumatic hematoma of soft tissue: Secondary | ICD-10-CM

## 2021-11-11 DIAGNOSIS — N6489 Other specified disorders of breast: Secondary | ICD-10-CM

## 2021-11-11 NOTE — Therapy (Signed)
OUTPATIENT PHYSICAL THERAPY TREATMENT NOTE   Patient Name: Beth Stephens MRN: [id number] DOB:06-03-1975, 46 y.o., female Today's Date: 11/11/2021  PCP: Janie Morning, DO REFERRING PROVIDER: Janie Morning, DO  END OF SESSION:   PT End of Session - 11/11/21 1310     Visit Number 12    Number of Visits 17    Date for PT Re-Evaluation 12/16/21    PT Start Time 5631   pt arrived late   PT Stop Time 1402    PT Time Calculation (min) 54 min    Activity Tolerance Patient tolerated treatment well    Behavior During Therapy WFL for tasks assessed/performed                   Past Medical History:  Diagnosis Date   Cancer ([place])    Breast Cancer   Colitis    Colitis    Past Surgical History:  Procedure Laterality Date   BREAST LUMPECTOMY WITH RADIOACTIVE SEED LOCALIZATION Left 05/01/2021   Procedure: LEFT BREAST LUMPECTOMY WITH RADIOACTIVE SEED LOCALIZATION;  Surgeon: Stark Klein, MD;  Location: [place];  Service: General;  Laterality: Left;   COLONOSCOPY     WISDOM TOOTH EXTRACTION     Patient Active Problem List   Diagnosis Date Noted   Iron deficiency anemia due to chronic blood loss 05/24/2021   Genetic testing 04/22/2021   Ductal carcinoma in situ (DCIS) of left breast 04/08/2021    REFERRING DIAG: DCIS Lt breast  THERAPY DIAG:  Stiffness of left shoulder, not elsewhere classified  Breast edema  Nontraumatic hematoma of soft tissue  Ductal carcinoma in situ (DCIS) of left breast  PERTINENT HISTORY: Left lumpectomy 05/01/21 no SLNB with Dr. Barry Dienes.  Large hematoma formation. Radiation completed  PRECAUTIONS: Other: very small lymphedema risk Lt UE and breast due to radiation  SUBJECTIVE:  My shoulder isn't hurting right now but I'm off on Mondays. I've been trying to watch my posture more while at work.   PAIN:  Are you having pain? NO -  it is up to 6/10 during abduction with extension Pain location: anterior/posterior shoulder Pain description: sharp   Aggravating factors: sleeping on it Relieving factors: rest   OBJECTIVE:  PALPATION: Very hard mandarin orange size hematoma under lateral breast incision, +2 ttp axillary palpation with some puffiness here.  Overall breast not swollen.     OBSERVATIONS / OTHER ASSESSMENTS: 07/22/21: radiated field still red and healing especially inferior to breast, and peeling blister area in the axilla.        07/02/21: healing hematoma Lt lateral breast, puffiness Lt axilla, wearing soft cotton eye clasp front bra which has no compression but pt reports is comfortable and the only fabric that is okay to wear currently. 11/04/21- hematoma still firm and palpable in left lateral breast, educated pt to try and sleep in her compression bra   UPPER EXTREMITY AROM/PROM:   A/PROM RIGHT  07/02/2021     Shoulder extension 53   Shoulder flexion 174   Shoulder abduction 165   Shoulder internal rotation Behind the back to T6   Shoulder external rotation 90                           (Blank rows = not tested)   A/PROM LEFT  07/02/2021 07/22/21 08/13/21 08/28/21 11/04/21  Shoulder extension 54    66  Shoulder flexion 165 145 - ache top of shoulder  155 - with top  of the shoulder pinch 155 - no pinch  151 - feels pain coming on  Shoulder abduction 160 155 - ache top of the shoulder  155 - ache top of the shoulder  167- no pinch 150 - feels pain coming on  Shoulder internal rotation Behind the back to T4      Shoulder external rotation 90                              (Blank rows = not tested)   TODAY'S TREATMENT  11/11/21 Manual Therapy STM to Lt lats and serratus and briefly to rhomboids in supine and Rt S/L, with cocoa butter when in S/L PROM to Lt shoulder into flexion, abduction and D2 to pts tolerance while providing manual pressure to help improve scapular depression, pt did reports some pinching at times but this seemed to improve as did her end P/ROM by end of session MLD: Lt inguinal nodes, Lt axillo-inguinal  anastomosis and Lt lateral trunk where fullness palpable  11/04/21 Instructed pt in supine scapular series with yellow theraband as follows: narrow and wide grip flexion x 5 each, horizontal abduction x 5, ER x 5, diagonals x 5 and issued instructions for pt to continue with these at home STM to L lats and serratus and briefly to rhomboids PROM to L shoulder while providing manual pressure to help improve scapular depression  08/28/21 Reviewed final HEP in case of this being the last day.  Performed each per below without sig vcs needed now for completion.  STM Lt UT, anterior/lateral shoulder, pectoralis, and latissimus, supraspinatus, and levator scapulae with TPR at levator today with referred pain here Gr. 3/4 GH Posterior and inferior mobs in various positions PROM flexion, scaption abduction with emphasis on scapular depression and avoiding pinch    PATIENT EDUCATION:  Education details: theraband exs SR, IR and ER with yellow Person educated: Patient Education method: Explanation, Demonstration, Tactile cues, Verbal cues, and Handouts Education comprehension: verbalized understanding, returned demonstration, verbal cues required, and needs further education    HOME EXERCISE PROGRAM: Access Code: [email or web address] URL: [email or web address] Date: 08/28/2021 Prepared by: Shan Levans  Exercises - Serratus Forearm Push Up Plus at Wall with Elbows - 1 x daily - 3 x weekly - 1 sets - 10 reps - no hold - Standing Shoulder Row with Anchored Resistance - 1 x daily - 3 x weekly - 1-3 sets - 10 reps - 3sec hold - Shoulder extension with resistance - Neutral - 1 x daily - 3 x weekly - 1-3 sets - 10 reps - 3sec hold - Standing Shoulder External Rotation with Resistance - 1 x daily - 3 x weekly - 1-3 sets - 10 reps - 3sec hold - Single Arm Doorway Pec Stretch at 90 Degrees Abduction - 1-2 x daily - 7 x weekly - 2 reps - 30-60sec hold  ASSESSMENT:   CLINICAL IMPRESSION: Continued with  manual therapy. Pt reported some pinching during P/ROM but this was improved with scapular depression and her motion improved by end of session as well. Included MLD to Lt lateral trunk over area of palpable firmness here.      OBJECTIVE IMPAIRMENTS decreased knowledge of use of DME, decreased ROM, and increased edema.    ACTIVITY LIMITATIONS community activity and yard work.    PERSONAL FACTORS 1 comorbidity: hematoma complication  are also affecting patient's functional outcome.      REHAB POTENTIAL: Excellent  CLINICAL DECISION MAKING: Stable/uncomplicated   EVALUATION COMPLEXITY: Low  GOALS: Goals reviewed with patient? Yes     LONG TERM GOALS: Target date: 08/13/2021   Pt will improve Lt UE AROM to equal to the Rt UE and without pain Baseline:  Goal status: ONGOING   2.  Pt will be ind with self care regarding Lt breast hematoma including self massage, compression bra, and use of foam or swell spot Baseline:  Goal status: ONGOING - pt reports it has softened but it is still there  3. Pt will not demonstrate shooting breast  pains to allow improved comfort.  Baseline:  Goals status: INITIAL   PLAN: PT FREQUENCY: 2x/week   PT DURATION: 4weeks   PLANNED INTERVENTIONS: Therapeutic exercises, Patient/Family education, Joint mobilization, DME instructions, Manual lymph drainage, Compression bandaging, scar mobilization, Taping, and Manual therapy, iontophoresis with dexamethasone*   PLAN FOR NEXT SESSION: PROM Lt shoulder, STM, scapular and GH mob, working on increase AROM and decreasing shoulder pain with overhead reach; ionto or tape?       Otelia Limes, PTA 11/11/2021, 4:18 PM

## 2021-11-18 ENCOUNTER — Ambulatory Visit: Payer: 59

## 2021-11-19 ENCOUNTER — Encounter: Payer: Self-pay | Admitting: *Deleted

## 2021-11-19 ENCOUNTER — Other Ambulatory Visit: Payer: Self-pay

## 2021-11-19 ENCOUNTER — Inpatient Hospital Stay: Payer: 59 | Admitting: Nurse Practitioner

## 2021-11-24 NOTE — Progress Notes (Signed)
CLINIC:  Survivorship   Patient Care Team: Irena Reichmann, DO as PCP - General (Family Medicine) Pershing Proud, RN as Oncology Nurse Navigator Donnelly Angelica, RN as Oncology Nurse Navigator Kerry Dory, MD as Referring Physician (Hematology) Almond Lint, MD as Consulting Physician (General Surgery) Lonie Peak, MD as Attending Physician (Radiation Oncology) Pollyann Samples, NP as Nurse Practitioner (Nurse Practitioner) Malachy Mood, MD as Consulting Physician (Hematology)   REASON FOR VISIT:  Routine follow-up post-treatment for a recent history of breast cancer.  BRIEF ONCOLOGIC HISTORY:  Oncology History Overview Note   Cancer Staging  Ductal carcinoma in situ (DCIS) of left breast Staging form: Breast, AJCC 8th Edition - Clinical stage from 04/02/2021: Stage 0 (cTis (DCIS), cN0, cM0, G3, ER+, PR+, HER2: Not Assessed) - Signed by Malachy Mood, MD on 04/09/2021 - Pathologic stage from 05/01/2021: Stage Unknown (pTis (DCIS), pNX, cM0, G2, ER+, PR+, HER2: Not Assessed) - Signed by Malachy Mood, MD on 05/23/2021    Ductal carcinoma in situ (DCIS) of left breast  01/18/2021 Mammogram   Exam: 3D Mammogram Diagnostic Digital - L  IMPRESSION: The 0.3 cm grouped round calcifications in the left breast are suspicious.   04/02/2021 Initial Biopsy   Diagnosis 1. Breast, left, needle core biopsy, left breast calcification @ 3:00 6 cmfn  - DUCTAL CARCINOMA IN SITU WITH NECROSIS AND CALCIFICATIONS  - SEE COMMENT Microscopic Comment Immunohistochemistry was performed on the biopsy to assess for an invasive component (SMM, calponin, CD34 and p63). Myoepithelial cells are present supporting the diagnosis of ductal carcinoma in situ. Based on the biopsy, the ductal carcinoma in situ cribriform and solid patterns, intermediate nuclear grade, and measures 0.6 cm in greatest linear extent.  1. PROGNOSTIC INDICATORS Results: Estrogen Receptor: 100%, POSITIVE, STRONG STAINING INTENSITY Progesterone  Receptor: 70%, POSITIVE, STRONG STAINING INTENSITY   04/02/2021 Cancer Staging   Staging form: Breast, AJCC 8th Edition - Clinical stage from 04/02/2021: Stage 0 (cTis (DCIS), cN0, cM0, G3, ER+, PR+, HER2: Not Assessed) - Signed by Malachy Mood, MD on 04/09/2021 Stage prefix: Initial diagnosis Histologic grading system: 3 grade system   04/08/2021 Initial Diagnosis   Ductal carcinoma in situ (DCIS) of left breast    Genetic Testing   [facility] CancerNext-Expanded Panel was Negative. Report date is 04/20/2021.  The CancerNext-Expanded gene panel offered by [facility] and includes sequencing, rearrangement, and RNA analysis for the following 77 genes: AIP, ALK, APC, ATM, AXIN2, BAP1, BARD1, BLM, BMPR1A, BRCA1, BRCA2, BRIP1, CDC73, CDH1, CDK4, CDKN1B, CDKN2A, CHEK2, CTNNA1, DICER1, FANCC, FH, FLCN, GALNT12, KIF1B, LZTR1, MAX, MEN1, MET, MLH1, MSH2, MSH3, MSH6, MUTYH, NBN, NF1, NF2, NTHL1, PALB2, PHOX2B, PMS2, POT1, PRKAR1A, PTCH1, PTEN, RAD51C, RAD51D, RB1, RECQL, RET, SDHA, SDHAF2, SDHB, SDHC, SDHD, SMAD4, SMARCA4, SMARCB1, SMARCE1, STK11, SUFU, TMEM127, TP53, TSC1, TSC2, VHL and XRCC2 (sequencing and deletion/duplication); EGFR, EGLN1, HOXB13, KIT, MITF, PDGFRA, POLD1, and POLE (sequencing only); EPCAM and GREM1 (deletion/duplication only).    04/26/2021 Imaging   EXAM: BILATERAL BREAST MRI WITH AND WITHOUT CONTRAST  IMPRESSION: 1. No significant enhancement at the site of known ductal carcinoma in situ in the LATERAL portion of the LEFT breast. There is minimal enhancement along the biopsy tract in this region. 2. 0.8 centimeter indeterminate enhancing mass in the posterior LOWER INNER QUADRANT of the LEFT breast. 3. RIGHT breast is negative.   05/01/2021 Cancer Staging   Staging form: Breast, AJCC 8th Edition - Pathologic stage from 05/01/2021: Stage Unknown (pTis (DCIS), pNX, cM0, G2, ER+, PR+, HER2: Not  Assessed) - Signed by Truitt Merle, MD on 05/23/2021 Stage prefix: Initial diagnosis Histologic  grading system: 3 grade system Residual tumor (R): R0 - None   05/01/2021 Pathology Results   FINAL MICROSCOPIC DIAGNOSIS:   A. BREAST, LEFT MEDIAL @ 7:30, EXCISION:  - Fibrocystic changes with calcifications.  - No malignancy identified.   B. BREAST, LEFT @ 3;00, LUMPECTOMY:  - Ductal carcinoma in situ, 1 cm.  - All surgical margins negative for DCIS.  - Fibrocystic changes with diffuse sclerosing adenosis and  calcifications.  - Biopsy site and biopsy clip.  - See oncology table.    06/24/2021 - 07/15/2021 Radiation Therapy   Completed 42.56 Pearline Cables in 16 fractions to the left breast by Dr. Isidore Moos   07/2021 -  Anti-estrogen oral therapy   Began adjuvant tamoxifen 10 mg p.o. once daily, goal 3-5 years   11/25/2021 Survivorship   SCP visit with Cira Rue, NP     INTERVAL HISTORY:  Beth Stephens presents to the [facility] today for our initial meeting to review her survivorship care plan detailing her treatment course for breast cancer, as well as monitoring long-term side effects of that treatment, education regarding health maintenance, screening, and overall wellness and health promotion.     Overall, Beth Stephens is doing well.  She has recovered from surgery and radiation still with large left breast hematoma.  Pressure is intense most noticeable with left side-lying.  She is concerned about doing the mammogram in October if this has not resolved yet.  She is back in PT which is helping that and left shoulder pain.  She has frequent intense heat at night with sweating but not drenching.  This makes sleeping difficult and may skip tamoxifen 1 or 2 times per week.  She has loose bowels especially with coffee in the setting of h/o IBD, takes oral iron to offset this.  When she went to Dr. Gaylyn Cheers they drew 23 vials of blood and noticed palpitations after that which have resolved.    REVIEW OF SYSTEMS:  Review of Systems - Oncology Breast: Denies any new nodularity, masses,  tenderness, nipple changes, or nipple discharge.      ONCOLOGY TREATMENT TEAM:  1. Surgeon:  Dr. Barry Dienes at [facility] 2. Medical Oncologist: Dr. Burr Medico  3. Radiation Oncologist: Dr. Isidore Moos    PAST MEDICAL/SURGICAL HISTORY:  Past Medical History:  Diagnosis Date   Cancer [facility])    Breast Cancer   Colitis    Colitis    Past Surgical History:  Procedure Laterality Date   BREAST LUMPECTOMY WITH RADIOACTIVE SEED LOCALIZATION Left 05/01/2021   Procedure: LEFT BREAST LUMPECTOMY WITH RADIOACTIVE SEED LOCALIZATION;  Surgeon: Stark Klein, MD;  Location: [place];  Service: General;  Laterality: Left;   COLONOSCOPY     WISDOM TOOTH EXTRACTION       ALLERGIES:  Allergies  Allergen Reactions   Gluten Meal Other (See Comments)    Pt sensitive to gluten - causes bloating and abdominal pain   Sumatriptan Palpitations     CURRENT MEDICATIONS:  Outpatient Encounter Medications as of 11/25/2021  Medication Sig   sulfaSALAzine (AZULFIDINE) 500 MG tablet Take 1,000 mg by mouth See admin instructions. Take 2 tablets (1000 mg) by mouth daily after lunch, may take 2 tablets (1000 mg) two more times in the day as needed for colitis flare   tamoxifen (NOLVADEX) 10 MG tablet Take 1 tablet (10 mg total) by mouth daily.   No facility-administered encounter medications on file  as of 11/25/2021.     ONCOLOGIC FAMILY HISTORY:  Family History  Problem Relation Age of Onset   Heart disease Father    Lung cancer Paternal Uncle        non-smoker   Cancer Other        lung cancer     GENETIC COUNSELING/TESTING: Yes, negative in 03/2021  SOCIAL HISTORY:  Aneliese Beaudry is married with a son and lives with her family in [place], [place].  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  She denies any current or history of tobacco, alcohol, or illicit drug use.     PHYSICAL EXAMINATION:  Vital Signs:   Vitals:   11/25/21 1254  BP: (!) 105/55  Pulse: 78   Resp: 15  Temp: 98.3 F (36.8 C)  SpO2: 97%   Filed Weights   11/25/21 1254  Weight: 121 lb 3.2 oz (55 kg)   General: Well-nourished, well-appearing female in no acute distress.   HEENT: Sclerae anicteric.  Lymph: No cervical, supraclavicular, or infraclavicular lymphadenopathy noted on palpation.  Respiratory: breathing non-labored.  Neuro: No focal deficits. Steady gait.  Psych: Mood and affect normal and appropriate for situation.  Extremities: No edema. MSK:  Full range of motion in bilateral upper extremities Skin: Warm and dry. Breast exam: Breasts are symmetrical without nipple discharge or inversion.  S/p left lumpectomy and radiation, incisions completely healed with mild scar tissue.  Hematoma palpable in the lower outer breast.  Mild diffuse hyperpigmentation.  Right breast benign, no axillary adenopathy  LABORATORY DATA:  None for this visit.  DIAGNOSTIC IMAGING:  None for this visit.      ASSESSMENT AND PLAN:  Ms.. Stephens is a pleasant 46 y.o. 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  She presents to the Survivorship Clinic for our initial meeting and routine follow-up post-completion of treatment for breast cancer.    1. Stage 0 left breast cancer/DCIS:  Beth Stephens is continuing to recover from definitive treatment for breast cancer.  Exam shows a hematoma in the lower outer left breast. She will follow-up with her medical oncologist, Dr. Burr Medico in 12/2021 as previously scheduled. She will continue her anti-estrogen therapy with Tamoxifen. Thus far, she is tolerating the moderately well, with intense heat/hot flashes mainly at night.  We discussed ways to manage this.  She was instructed to make Dr. Burr Medico or myself aware if she begins to experience any worsening side effects of the medication and I  could see her back in clinic to help manage those side effects, as needed. Though the incidence is low, there is an associated risk of endometrial cancer with anti-estrogen therapies like Tamoxifen.  Beth Stephens was encouraged to contact Dr. Burr Medico or myself with any vaginal bleeding while taking Tamoxifen.  As discussed I will refer her to a female OB/GYN for annual pelvic exams.  Other side effects of Tamoxifen were again reviewed with her as well. Today, a comprehensive survivorship care plan and treatment summary was reviewed with the patient today detailing her breast cancer diagnosis, treatment course, potential late/long-term effects of treatment, appropriate follow-up care with recommendations for the future, and patient education resources.  A copy of this summary, along with a letter will be sent to the patient's primary care provider via mail/fax/In Basket message after today's visit.    2.  Hot flashes: Secondary  to tamoxifen -we discussed ways to manage including avoiding triggers.  For now they are tolerable, but if not in the future we discussed medication including SSRIs or gabapentin.  She prefers to hold for now.  We will follow closely.  3. Bone health:  Given Beth Stephens's history, her premenopausal age, and bone strengthening quality of tamoxifen she has not yet begun DEXA screenings.  We will check a vitamin D at next visit.  In the meantime, she was encouraged to increase her consumption of foods rich in calcium, as well as increase her weight-bearing activities.  She was given education on specific activities to promote bone health.  4. Cancer screening:  Due to Beth Stephens's age, she should continue screenings for skin cancers, colon cancer, and gynecologic cancers.  The information and recommendations are listed on the patient's comprehensive care plan/treatment summary and were reviewed in detail with the patient.    5. Health maintenance and wellness promotion: Beth Stephens was  encouraged to consume 5-7 servings of fruits and vegetables per day. She was also encouraged to engage in moderate to vigorous exercise for 30 minutes per day most days of the week. She was instructed to limit her alcohol consumption and continue to abstain from tobacco use.     6. Support services/counseling: It is not uncommon for this period of the patient's cancer care trajectory to be one of many emotions and stressors.  We discussed an opportunity for her to participate in the next session of [facility] ("Finding Your New Normal") support group series designed for patients after they have completed treatment.   Beth Stephens was encouraged to take advantage of our many other support services programs, support groups, and/or counseling in coping with her new life as a cancer survivor after completing anti-cancer treatment.  She was offered support today through active listening and expressive supportive counseling.  She was given information regarding our available services and encouraged to contact me with any questions or for help enrolling in any of our support group/programs.    Dispo:   -Return to cancer center 01/13/2022 as scheduled, close follow-up for hot flashes -Mammogram due in early October.  Given that she still has a left breast hematoma we discussed proceeding with right mammo and left breast ultrasound as an alternative -Follow up with surgery as scheduled -Refer to female OB/GYN for endometrial cancer surveillance on tamoxifen -She is welcome to return back to the Survivorship Clinic at any time; no additional follow-up needed at this time.  -Consider referral back to survivorship as a long-term survivor for continued surveillance  A total of (30) minutes of face-to-face time was spent with this patient with greater than 50% of that time in counseling and care-coordination.   Cira Rue, NP Survivorship Program [facility] [phone]   Note: PRIMARY CARE  PROVIDER Janie Morning, [place] [phone]

## 2021-11-25 ENCOUNTER — Other Ambulatory Visit: Payer: Self-pay

## 2021-11-25 ENCOUNTER — Ambulatory Visit: Payer: 59

## 2021-11-25 ENCOUNTER — Encounter: Payer: Self-pay | Admitting: Nurse Practitioner

## 2021-11-25 ENCOUNTER — Inpatient Hospital Stay: Payer: 59 | Attending: Hematology | Admitting: Nurse Practitioner

## 2021-11-25 VITALS — BP 105/55 | HR 78 | Temp 98.3°F | Resp 15 | Wt 121.2 lb

## 2021-11-25 DIAGNOSIS — Z923 Personal history of irradiation: Secondary | ICD-10-CM | POA: Diagnosis not present

## 2021-11-25 DIAGNOSIS — N951 Menopausal and female climacteric states: Secondary | ICD-10-CM | POA: Diagnosis not present

## 2021-11-25 DIAGNOSIS — N6489 Other specified disorders of breast: Secondary | ICD-10-CM

## 2021-11-25 DIAGNOSIS — M25612 Stiffness of left shoulder, not elsewhere classified: Secondary | ICD-10-CM | POA: Diagnosis not present

## 2021-11-25 DIAGNOSIS — D0512 Intraductal carcinoma in situ of left breast: Secondary | ICD-10-CM

## 2021-11-25 DIAGNOSIS — M7981 Nontraumatic hematoma of soft tissue: Secondary | ICD-10-CM

## 2021-11-25 DIAGNOSIS — C50912 Malignant neoplasm of unspecified site of left female breast: Secondary | ICD-10-CM | POA: Insufficient documentation

## 2021-11-25 NOTE — Progress Notes (Signed)
New patient referral, Demo and last office notes faxed to [facility] for annual pelvic exams and surveillance while taking Tamoxifen.  Fax #: [phone].

## 2021-11-25 NOTE — Therapy (Signed)
OUTPATIENT PHYSICAL THERAPY TREATMENT NOTE   Patient Name: Beth Stephens MRN: [id number] DOB:March 23, 1976, 46 y.o., female Today's Date: 11/25/2021  PCP: Janie Morning, DO REFERRING PROVIDER: Eppie Gibson, MD  END OF SESSION:   PT End of Session - 11/25/21 1503     Visit Number 13    Number of Visits 17    Date for PT Re-Evaluation 12/16/21    Authorization Type BCBS auth unknown at eval    PT Start Time 1504    PT Stop Time 1553    PT Time Calculation (min) 49 min    Activity Tolerance Patient tolerated treatment well    Behavior During Therapy WFL for tasks assessed/performed                   Past Medical History:  Diagnosis Date   Cancer ([place])    Breast Cancer   Colitis    Colitis    Past Surgical History:  Procedure Laterality Date   BREAST LUMPECTOMY WITH RADIOACTIVE SEED LOCALIZATION Left 05/01/2021   Procedure: LEFT BREAST LUMPECTOMY WITH RADIOACTIVE SEED LOCALIZATION;  Surgeon: Stark Klein, MD;  Location: [place];  Service: General;  Laterality: Left;   COLONOSCOPY     WISDOM TOOTH EXTRACTION     Patient Active Problem List   Diagnosis Date Noted   Cancer of left breast ([place]) 11/25/2021   Iron deficiency anemia due to chronic blood loss 05/24/2021   Genetic testing 04/22/2021   Ductal carcinoma in situ (DCIS) of left breast 04/08/2021    REFERRING DIAG: DCIS Lt breast  THERAPY DIAG:  Stiffness of left shoulder, not elsewhere classified  Breast edema  Nontraumatic hematoma of soft tissue  Ductal carcinoma in situ (DCIS) of left breast  PERTINENT HISTORY: Left lumpectomy 05/01/21 no SLNB with Dr. Barry Dienes.  Large hematoma formation. Radiation completed  PRECAUTIONS: Other: very small lymphedema risk Lt UE and breast due to radiation  SUBJECTIVE: I was doing better and then the shoulder pain came back again. I am trying to be concious of how I do things. The hematoma is still about the same. I still can't sleep on the left.  PAIN:  Are you  having pain? NO -  it is up to 6/10 during abduction with extension Pain location: anterior/posterior shoulder Pain description: sharp  Aggravating factors: sleeping on it Relieving factors: rest   OBJECTIVE:  PALPATION: Very hard mandarin orange size hematoma under lateral breast incision, +2 ttp axillary palpation with some puffiness here.  Overall breast not swollen.     OBSERVATIONS / OTHER ASSESSMENTS: 07/22/21: radiated field still red and healing especially inferior to breast, and peeling blister area in the axilla.        07/02/21: healing hematoma Lt lateral breast, puffiness Lt axilla, wearing soft cotton eye clasp front bra which has no compression but pt reports is comfortable and the only fabric that is okay to wear currently. 11/04/21- hematoma still firm and palpable in left lateral breast, educated pt to try and sleep in her compression bra   UPPER EXTREMITY AROM/PROM:   A/PROM RIGHT  07/02/2021     Shoulder extension 53   Shoulder flexion 174   Shoulder abduction 165   Shoulder internal rotation Behind the back to T6   Shoulder external rotation 90                           (Blank rows = not tested)   A/PROM LEFT  07/02/2021  07/22/21 08/13/21 08/28/21 11/04/21  Shoulder extension 54    66  Shoulder flexion 165 145 - ache top of shoulder  155 - with top of the shoulder pinch 155 - no pinch  151 - feels pain coming on  Shoulder abduction 160 155 - ache top of the shoulder  155 - ache top of the shoulder  167- no pinch 150 - feels pain coming on  Shoulder internal rotation Behind the back to T4      Shoulder external rotation 90                              (Blank rows = not tested)   TODAY'S TREATMENT   11/22/2021 STM to  left Pectorals, UT,Lt lats and serratus and briefly to rhomboids in supine and Rt S/L, with cocoa butter  Supine wand flexion and scaption x 5, Lower trunk rotation x 5 knees to the right with arms extendend and goal post x 3 ea PROM to Lt shoulder into flexion,  abduction and D2 flex and ER MLD: Lt inguinal nodes, Lt axillo-inguinal anastomosis and Lt lateral trunk where fullness palpable    11/11/21 Manual Therapy STM to Lt lats and serratus and briefly to rhomboids in supine and Rt S/L, with cocoa butter when in S/L PROM to Lt shoulder into flexion, abduction and D2 to pts tolerance while providing manual pressure to help improve scapular depression, pt did reports some pinching at times but this seemed to improve as did her end P/ROM by end of session MLD: Lt inguinal nodes, Lt axillo-inguinal anastomosis and Lt lateral trunk where fullness palpable  11/04/21 Instructed pt in supine scapular series with yellow theraband as follows: narrow and wide grip flexion x 5 each, horizontal abduction x 5, ER x 5, diagonals x 5 and issued instructions for pt to continue with these at home STM to L lats and serratus and briefly to rhomboids PROM to L shoulder while providing manual pressure to help improve scapular depression  08/28/21 Reviewed final HEP in case of this being the last day.  Performed each per below without sig vcs needed now for completion.  STM Lt UT, anterior/lateral shoulder, pectoralis, and latissimus, supraspinatus, and levator scapulae with TPR at levator today with referred pain here Gr. 3/4 GH Posterior and inferior mobs in various positions PROM flexion, scaption abduction with emphasis on scapular depression and avoiding pinch    PATIENT EDUCATION:  Education details: theraband exs SR, IR and ER with yellow Person educated: Patient Education method: Explanation, Demonstration, Tactile cues, Verbal cues, and Handouts Education comprehension: verbalized understanding, returned demonstration, verbal cues required, and needs further education    HOME EXERCISE PROGRAM: Access Code: [email or web address] URL: [email or web address] Date: 08/28/2021 Prepared by: Shan Levans  Exercises - Serratus Forearm Push Up Plus at Wall with  Elbows - 1 x daily - 3 x weekly - 1 sets - 10 reps - no hold - Standing Shoulder Row with Anchored Resistance - 1 x daily - 3 x weekly - 1-3 sets - 10 reps - 3sec hold - Shoulder extension with resistance - Neutral - 1 x daily - 3 x weekly - 1-3 sets - 10 reps - 3sec hold - Standing Shoulder External Rotation with Resistance - 1 x daily - 3 x weekly - 1-3 sets - 10 reps - 3sec hold - Single Arm Doorway Pec Stretch at 90 Degrees Abduction - 1-2 x daily - 7 x weekly -  2 reps - 30-60sec hold  ASSESSMENT:   CLINICAL IMPRESSION: Continued with soft tissue mobilization, AAROM, PROM and MLD. Pt had 1 episode of pinching with PROM in abduction, relieved with scapular depression. Firmness at site of hematoma remains. Pt felt good after rx.  OBJECTIVE IMPAIRMENTS decreased knowledge of use of DME, decreased ROM, and increased edema.    ACTIVITY LIMITATIONS community activity and yard work.    PERSONAL FACTORS 1 comorbidity: hematoma complication  are also affecting patient's functional outcome.      REHAB POTENTIAL: Excellent   CLINICAL DECISION MAKING: Stable/uncomplicated   EVALUATION COMPLEXITY: Low  GOALS: Goals reviewed with patient? Yes     LONG TERM GOALS: Target date: 08/13/2021   Pt will improve Lt UE AROM to equal to the Rt UE and without pain Baseline:  Goal status: ONGOING   2.  Pt will be ind with self care regarding Lt breast hematoma including self massage, compression bra, and use of foam or swell spot Baseline:  Goal status: ONGOING - pt reports it has softened but it is still there  3. Pt will not demonstrate shooting breast  pains to allow improved comfort.  Baseline:  Goals status: INITIAL   PLAN: PT FREQUENCY: 2x/week   PT DURATION: 4weeks   PLANNED INTERVENTIONS: Therapeutic exercises, Patient/Family education, Joint mobilization, DME instructions, Manual lymph drainage, Compression bandaging, scar mobilization, Taping, and Manual therapy, iontophoresis with  dexamethasone*   PLAN FOR NEXT SESSION: PROM Lt shoulder, STM, scapular and GH mob, working on increase AROM and decreasing shoulder pain with overhead reach; MLD lateral trunk, review theraband exs.       Claris Pong, PT 11/25/2021, 4:00 PM

## 2021-12-09 ENCOUNTER — Ambulatory Visit: Payer: 59 | Attending: Radiation Oncology

## 2021-12-09 DIAGNOSIS — N6489 Other specified disorders of breast: Secondary | ICD-10-CM | POA: Insufficient documentation

## 2021-12-09 DIAGNOSIS — M25612 Stiffness of left shoulder, not elsewhere classified: Secondary | ICD-10-CM | POA: Insufficient documentation

## 2021-12-09 DIAGNOSIS — D0512 Intraductal carcinoma in situ of left breast: Secondary | ICD-10-CM | POA: Insufficient documentation

## 2021-12-09 DIAGNOSIS — M7981 Nontraumatic hematoma of soft tissue: Secondary | ICD-10-CM | POA: Diagnosis present

## 2021-12-09 NOTE — Therapy (Signed)
OUTPATIENT PHYSICAL THERAPY TREATMENT NOTE   Patient Name: Beth Stephens MRN: [id number] DOB:Jun 19, 1975, 46 y.o., female Today's Date: 12/09/2021  PCP: Janie Morning, DO REFERRING PROVIDER: Eppie Gibson, MD  END OF SESSION:   PT End of Session - 12/09/21 1612     Visit Number 14    Number of Visits 17    Date for PT Re-Evaluation 12/16/21    Authorization Type [employer] auth unknown at eval    PT Start Time 1613    PT Stop Time 1700    PT Time Calculation (min) 47 min    Activity Tolerance Patient tolerated treatment well    Behavior During Therapy WFL for tasks assessed/performed                   Past Medical History:  Diagnosis Date   Cancer ([place])    Breast Cancer   Colitis    Colitis    Past Surgical History:  Procedure Laterality Date   BREAST LUMPECTOMY WITH RADIOACTIVE SEED LOCALIZATION Left 05/01/2021   Procedure: LEFT BREAST LUMPECTOMY WITH RADIOACTIVE SEED LOCALIZATION;  Surgeon: Stark Klein, MD;  Location: [facility];  Service: General;  Laterality: Left;   COLONOSCOPY     WISDOM TOOTH EXTRACTION     Patient Active Problem List   Diagnosis Date Noted   Cancer of left breast ([place]) 11/25/2021   Iron deficiency anemia due to chronic blood loss 05/24/2021   Genetic testing 04/22/2021   Ductal carcinoma in situ (DCIS) of left breast 04/08/2021    REFERRING DIAG: DCIS Lt breast  THERAPY DIAG:  Stiffness of left shoulder, not elsewhere classified  Breast edema  Nontraumatic hematoma of soft tissue  Ductal carcinoma in situ (DCIS) of left breast  PERTINENT HISTORY: Left lumpectomy 05/01/21 no SLNB with Dr. Barry Dienes.  Large hematoma formation. Radiation completed  PRECAUTIONS: Other: very small lymphedema risk Lt UE and breast due to radiation  SUBJECTIVE:  I did well after last visit. The shoulder pain got better again. The pain is more in my shoulder blade today. It is more when I am moving and turning to my left 5/10.  PAIN:  Are you having pain?  yes-  it is up to 5/10 with bed mobility and turning and raising arm Pain location: anterior/posterior shoulder Pain description: sharp  Aggravating factors: sleeping on it Relieving factors: rest   OBJECTIVE:  PALPATION: Very hard mandarin orange size hematoma under lateral breast incision, +2 ttp axillary palpation with some puffiness here.  Overall breast not swollen.     OBSERVATIONS / OTHER ASSESSMENTS: 07/22/21: radiated field still red and healing especially inferior to breast, and peeling blister area in the axilla.        07/02/21: healing hematoma Lt lateral breast, puffiness Lt axilla, wearing soft cotton eye clasp front bra which has no compression but pt reports is comfortable and the only fabric that is okay to wear currently. 11/04/21- hematoma still firm and palpable in left lateral breast, educated pt to try and sleep in her compression bra   UPPER EXTREMITY AROM/PROM:   A/PROM RIGHT  07/02/2021     Shoulder extension 53   Shoulder flexion 174   Shoulder abduction 165   Shoulder internal rotation Behind the back to T6   Shoulder external rotation 90                           (Blank rows = not tested)   A/PROM LEFT  07/02/2021 07/22/21 08/13/21 08/28/21 11/04/21  Shoulder extension 54    66  Shoulder flexion 165 145 - ache top of shoulder  155 - with top of the shoulder pinch 155 - no pinch  151 - feels pain coming on  Shoulder abduction 160 155 - ache top of the shoulder  155 - ache top of the shoulder  167- no pinch 150 - feels pain coming on  Shoulder internal rotation Behind the back to T4      Shoulder external rotation 90                              (Blank rows = not tested)   TODAY'S TREATMENT   12/09/2021 STM to  left Pectorals, UT,Lt lats and serratus and briefly to rhomboids in supine and Rt S/L, with cocoa butter  Supine wand flexion and scaption x 5, PROM to Lt shoulder into flexion, abduction and D2 flex and ER MLD: Lt inguinal nodes, Lt axillo-inguinal  anastomosis and Lt lateral trunk and breast  where fullness palpable Spaghetti foam pad in stockinette given to pt to place in bra in area of hematoma to try and soften  11/22/2021 STM to  left Pectorals, UT,Lt lats and serratus and briefly to rhomboids in supine and Rt S/L, with cocoa butter  Supine wand flexion and scaption x 5, Lower trunk rotation x 5 knees to the right with arms extendend and goal post x 3 ea PROM to Lt shoulder into flexion, abduction and D2 flex and ER MLD: Lt inguinal nodes, Lt axillo-inguinal anastomosis and Lt lateral trunk where fullness palpable    11/11/21 Manual Therapy STM to Lt lats and serratus and briefly to rhomboids in supine and Rt S/L, with cocoa butter when in S/L PROM to Lt shoulder into flexion, abduction and D2 to pts tolerance while providing manual pressure to help improve scapular depression, pt did reports some pinching at times but this seemed to improve as did her end P/ROM by end of session MLD: Lt inguinal nodes, Lt axillo-inguinal anastomosis and Lt lateral trunk where fullness palpable  11/04/21 Instructed pt in supine scapular series with yellow theraband as follows: narrow and wide grip flexion x 5 each, horizontal abduction x 5, ER x 5, diagonals x 5 and issued instructions for pt to continue with these at home STM to L lats and serratus and briefly to rhomboids PROM to L shoulder while providing manual pressure to help improve scapular depression  08/28/21 Reviewed final HEP in case of this being the last day.  Performed each per below without sig vcs needed now for completion.  STM Lt UT, anterior/lateral shoulder, pectoralis, and latissimus, supraspinatus, and levator scapulae with TPR at levator today with referred pain here Gr. 3/4 GH Posterior and inferior mobs in various positions PROM flexion, scaption abduction with emphasis on scapular depression and avoiding pinch    PATIENT EDUCATION:  Education details: theraband exs SR,  IR and ER with yellow Person educated: Patient Education method: Explanation, Demonstration, Tactile cues, Verbal cues, and Handouts Education comprehension: verbalized understanding, returned demonstration, verbal cues required, and needs further education    HOME EXERCISE PROGRAM: Access Code: [email or web address] URL: [email or web address] Date: 08/28/2021 Prepared by: Shan Levans  Exercises - Serratus Forearm Push Up Plus at Wall with Elbows - 1 x daily - 3 x weekly - 1 sets - 10 reps - no hold - Standing Shoulder Row with Anchored Resistance - 1  x daily - 3 x weekly - 1-3 sets - 10 reps - 3sec hold - Shoulder extension with resistance - Neutral - 1 x daily - 3 x weekly - 1-3 sets - 10 reps - 3sec hold - Standing Shoulder External Rotation with Resistance - 1 x daily - 3 x weekly - 1-3 sets - 10 reps - 3sec hold - Single Arm Doorway Pec Stretch at 90 Degrees Abduction - 1-2 x daily - 7 x weekly - 2 reps - 30-60sec hold  ASSESSMENT:   CLINICAL IMPRESSION: Continued with soft tissue mobilization, AAROM, PROM and MLD. Firmness at site of hematoma remains.  New foam pad with spaghetti foam made for pt to try and soften hematoma.Pt with large area of increased tissue tension in left inferior scapular region  OBJECTIVE IMPAIRMENTS decreased knowledge of use of DME, decreased ROM, and increased edema.    ACTIVITY LIMITATIONS community activity and yard work.    PERSONAL FACTORS 1 comorbidity: hematoma complication  are also affecting patient's functional outcome.      REHAB POTENTIAL: Excellent   CLINICAL DECISION MAKING: Stable/uncomplicated   EVALUATION COMPLEXITY: Low  GOALS: Goals reviewed with patient? Yes     LONG TERM GOALS: Target date: 08/13/2021   Pt will improve Lt UE AROM to equal to the Rt UE and without pain Baseline:  Goal status: ONGOING   2.  Pt will be ind with self care regarding Lt breast hematoma including self massage, compression bra, and use of foam  or swell spot Baseline:  Goal status: ONGOING - pt reports it has softened but it is still there  3. Pt will not demonstrate shooting breast  pains to allow improved comfort.  Baseline:  Goals status: INITIAL   PLAN: PT FREQUENCY: 2x/week   PT DURATION: 4weeks   PLANNED INTERVENTIONS: Therapeutic exercises, Patient/Family education, Joint mobilization, DME instructions, Manual lymph drainage, Compression bandaging, scar mobilization, Taping, and Manual therapy, iontophoresis with dexamethasone*   PLAN FOR NEXT SESSION: Recert next?PROM Lt shoulder, STM, scapular and GH mob, working on increase AROM and decreasing shoulder pain with overhead reach; MLD lateral trunk, review theraband exs.       Claris Pong, PT 12/09/2021, 5:17 PM

## 2021-12-23 ENCOUNTER — Ambulatory Visit: Payer: 59

## 2021-12-23 DIAGNOSIS — D0512 Intraductal carcinoma in situ of left breast: Secondary | ICD-10-CM

## 2021-12-23 DIAGNOSIS — M25612 Stiffness of left shoulder, not elsewhere classified: Secondary | ICD-10-CM | POA: Diagnosis not present

## 2021-12-23 DIAGNOSIS — M7981 Nontraumatic hematoma of soft tissue: Secondary | ICD-10-CM

## 2021-12-23 DIAGNOSIS — N6489 Other specified disorders of breast: Secondary | ICD-10-CM

## 2021-12-23 NOTE — Therapy (Signed)
OUTPATIENT PHYSICAL THERAPY TREATMENT NOTE   Patient Name: Beth Stephens MRN: [id number] DOB:11-11-1975, 46 y.o., female Today's Date: 12/23/2021  PCP: Janie Morning, DO REFERRING PROVIDER: Eppie Gibson, MD  END OF SESSION:   PT End of Session - 12/23/21 1559     Visit Number 15    Number of Visits 23    Date for PT Re-Evaluation 02/17/22    Authorization Type [id number]    PT Start Time 1603    PT Stop Time 1655    PT Time Calculation (min) 52 min    Activity Tolerance Patient tolerated treatment well    Behavior During Therapy WFL for tasks assessed/performed                   Past Medical History:  Diagnosis Date   Cancer ([place])    Breast Cancer   Colitis    Colitis    Past Surgical History:  Procedure Laterality Date   BREAST LUMPECTOMY WITH RADIOACTIVE SEED LOCALIZATION Left 05/01/2021   Procedure: LEFT BREAST LUMPECTOMY WITH RADIOACTIVE SEED LOCALIZATION;  Surgeon: Stark Klein, MD;  Location: [facility];  Service: General;  Laterality: Left;   COLONOSCOPY     WISDOM TOOTH EXTRACTION     Patient Active Problem List   Diagnosis Date Noted   Cancer of left breast ([place]) 11/25/2021   Iron deficiency anemia due to chronic blood loss 05/24/2021   Genetic testing 04/22/2021   Ductal carcinoma in situ (DCIS) of left breast 04/08/2021    REFERRING DIAG: DCIS Lt breast  THERAPY DIAG:  Stiffness of left shoulder, not elsewhere classified  Breast edema  Nontraumatic hematoma of soft tissue  Ductal carcinoma in situ (DCIS) of left breast  PERTINENT HISTORY: Left lumpectomy 05/01/21 no SLNB with Dr. Barry Dienes.  Large hematoma formation. Radiation completed  PRECAUTIONS: Other: very small lymphedema risk Lt UE and breast due to radiation  SUBJECTIVE:  Shoulder pain is better. I went to [place] last week and it doesn't really bother me. I noticed it when I put my bag overhead in the plane but otherwise it has been good. It will still "pinch" with certain motions  but does OK with routine things.The shoulder blade pain has been better too. I had a massage in [place] and it felt good but I had to ask her to lighten up.I feel like the foam is comfortable and I think the hematoma is a little softer  PAIN:  Are you having pain? yes-  it is  0/10 in shoulder today, but left ribs are  very sore where my child bumped me  a week or so ago. Shoulder can still get a painful pinch with certain motions, but is doing well with most routine activities. Pain location: anterior/posterior shoulder Pain description: sharp  Aggravating factors: sleeping on it Relieving factors: rest   OBJECTIVE:  PALPATION: Very hard mandarin orange size hematoma under lateral breast incision, +2 ttp axillary palpation with some puffiness here.  Overall breast not swollen.   12/23/2021 Hematoma is softer with less defined borders, pt is very tender today at lateral lower ribcage   OBSERVATIONS / OTHER ASSESSMENTS: 07/22/21: radiated field still red and healing especially inferior to breast, and peeling blister area in the axilla.        07/02/21: healing hematoma Lt lateral breast, puffiness Lt axilla, wearing soft cotton eye clasp front bra which has no compression but pt reports is comfortable and the only fabric that is okay to wear currently. 11/04/21- hematoma still  firm and palpable in left lateral breast, educated pt to try and sleep in her compression bra   UPPER EXTREMITY AROM/PROM:   A/PROM RIGHT  07/02/2021     Shoulder extension 53   Shoulder flexion 174   Shoulder abduction 165   Shoulder internal rotation Behind the back to T6   Shoulder external rotation 90                           (Blank rows = not tested)   A/PROM LEFT  07/02/2021 07/22/21 08/13/21 08/28/21 11/04/21 12/23/2021  Shoulder extension 54    66 60  Shoulder flexion  145 - ache top of shoulde 145 - ache top of shoulde 155 - with top of the shoulder pinch 155 - no pinch  151 - feels pain coming on 155 pulls at rib   Shoulder abduction 160 155 - ache top of the shoulder  155 - ache top of the shoulder  167- no pinch 150 - feels pain coming on 150 with pinch at end range  Shoulder internal rotation Behind the back to T4     T8 with pinch  Shoulder external rotation 90     94 with pinch at endrange                          (Blank rows = not tested)   TODAY'S TREATMENT   12/23/2021  STM to  left Pectorals, UT,Lt lats and serratus and briefly to rhomboids in supine and Rt S/L, with cocoa butter  Supine wand flexion and scaption x , PROM to Lt shoulder into flexion, abduction and D2 flex and ER. Lower trunk rotation with arms outstretched and in goal post position x3, standing left SB stretch x 3 Measured bilateral shoulder ROM for recert Incision area hematoma feels softer with less defined borders  12/09/2021 STM to  left Pectorals, UT,Lt lats and serratus and briefly to rhomboids in supine and Rt S/L, with cocoa butter  Supine wand flexion and scaption x 3, PROM to Lt shoulder into flexion, abduction and D2 flex and ER MLD: Lt inguinal nodes, Lt axillo-inguinal anastomosis and Lt lateral trunk and breast  where fullness palpable Spaghetti foam pad in stockinette given to pt to place in bra in area of hematoma to try and soften  11/22/2021 STM to  left Pectorals, UT,Lt lats and serratus and briefly to rhomboids in supine and Rt S/L, with cocoa butter  Supine wand flexion and scaption x 5, Lower trunk rotation x 5 knees to the right with arms extendend and goal post x 3 ea PROM to Lt shoulder into flexion, abduction and D2 flex and ER MLD: Lt inguinal nodes, Lt axillo-inguinal anastomosis and Lt lateral trunk where fullness palpable    11/11/21 Manual Therapy STM to Lt lats and serratus and briefly to rhomboids in supine and Rt S/L, with cocoa butter when in S/L PROM to Lt shoulder into flexion, abduction and D2 to pts tolerance while providing manual pressure to help improve scapular depression, pt  did reports some pinching at times but this seemed to improve as did her end P/ROM by end of session MLD: Lt inguinal nodes, Lt axillo-inguinal anastomosis and Lt lateral trunk where fullness palpable  11/04/21 Instructed pt in supine scapular series with yellow theraband as follows: narrow and wide grip flexion x 5 each, horizontal abduction x 5, ER x 5, diagonals x 5 and  issued instructions for pt to continue with these at home STM to L lats and serratus and briefly to rhomboids PROM to L shoulder while providing manual pressure to help improve scapular depression  08/28/21 Reviewed final HEP in case of this being the last day.  Performed each per below without sig vcs needed now for completion.  STM Lt UT, anterior/lateral shoulder, pectoralis, and latissimus, supraspinatus, and levator scapulae with TPR at levator today with referred pain here Gr. 3/4 GH Posterior and inferior mobs in various positions PROM flexion, scaption abduction with emphasis on scapular depression and avoiding pinch    PATIENT EDUCATION:  Education details: theraband exs SR, IR and ER with yellow Person educated: Patient Education method: Explanation, Demonstration, Tactile cues, Verbal cues, and Handouts Education comprehension: verbalized understanding, returned demonstration, verbal cues required, and needs further education    HOME EXERCISE PROGRAM: Access Code: [email or web address] URL: [email or web address] Date: 08/28/2021 Prepared by: Shan Levans  Exercises - Serratus Forearm Push Up Plus at Wall with Elbows - 1 x daily - 3 x weekly - 1 sets - 10 reps - no hold - Standing Shoulder Row with Anchored Resistance - 1 x daily - 3 x weekly - 1-3 sets - 10 reps - 3sec hold - Shoulder extension with resistance - Neutral - 1 x daily - 3 x weekly - 1-3 sets - 10 reps - 3sec hold - Standing Shoulder External Rotation with Resistance - 1 x daily - 3 x weekly - 1-3 sets - 10 reps - 3sec hold - Single Arm Doorway  Pec Stretch at 90 Degrees Abduction - 1-2 x daily - 7 x weekly - 2 reps - 30-60sec hold  ASSESSMENT:   CLINICAL IMPRESSION: Continued with soft tissue mobilization, AAROM, and PROM .Marland Kitchen Firmness at site of hematoma remains however, is softer and with less well defined borders today and pt feels the spaghetti foam is comfortable and helping.. Tissue tension present in left scapular area but much smaller area than previously. Pts ROM continues to be limited with pinching pain in posterior shoulder especially noted today with abduction and IR behind back. Pt does note she is able to do her routine activities with decreased shoulder pain, but certain motions as mentioned above still reproduce pinching.  OBJECTIVE IMPAIRMENTS decreased knowledge of use of DME, decreased ROM, and increased edema.    ACTIVITY LIMITATIONS community activity and yard work.    PERSONAL FACTORS 1 comorbidity: hematoma complication  are also affecting patient's functional outcome.      REHAB POTENTIAL: Excellent   CLINICAL DECISION MAKING: Stable/uncomplicated   EVALUATION COMPLEXITY: Low  GOALS: Goals reviewed with patient? Yes     LONG TERM GOALS: Target date: 08/13/2021   Pt will improve Lt UE AROM to equal to the Rt UE and without pain Baseline:  Goal status: ONGOING   2.  Pt will be ind with self care regarding Lt breast hematoma including self massage, compression bra, and use of foam or swell spot Baseline:  Goal status: ONGOING - pt reports it has softened but it is still there  3. Pt will not demonstrate shooting breast  pains to allow improved comfort.  Baseline:  Goals status: MET if not sleeping on left side 4. Pt will be independent in HEP for left shoulder ROM and in particular strengthening             GOAL STATUS : NEW PLAN: PT FREQUENCY: 1x/week   PT DURATION: 8 weeks prn   PLANNED INTERVENTIONS: Therapeutic  exercises, Patient/Family education, Joint mobilization, DME instructions,  Manual lymph drainage, Compression bandaging, scar mobilization, Taping, and Manual therapy, iontophoresis with dexamethasone*   PLAN FOR NEXT SESSION:  STM, scapular and GH mob, working on increase AROM and decreasing shoulder pain with overhead reach; PROM,MLD lateral trunk prn, review theraband exs. and progress strength to prevent posterior impingement.       Claris Pong, PT 12/23/2021, 5:26 PM

## 2021-12-27 ENCOUNTER — Telehealth: Payer: Self-pay | Admitting: Hematology

## 2021-12-27 NOTE — Telephone Encounter (Signed)
Left patient a voicemail regarding rescheduled 10/27 appointment

## 2021-12-30 ENCOUNTER — Ambulatory Visit: Payer: 59 | Admitting: Physical Therapy

## 2022-01-03 ENCOUNTER — Other Ambulatory Visit: Payer: Self-pay | Admitting: Hematology

## 2022-01-06 ENCOUNTER — Ambulatory Visit: Payer: 59 | Attending: Radiation Oncology

## 2022-01-06 DIAGNOSIS — M7981 Nontraumatic hematoma of soft tissue: Secondary | ICD-10-CM

## 2022-01-06 DIAGNOSIS — D0512 Intraductal carcinoma in situ of left breast: Secondary | ICD-10-CM

## 2022-01-06 DIAGNOSIS — N6489 Other specified disorders of breast: Secondary | ICD-10-CM

## 2022-01-06 DIAGNOSIS — M25612 Stiffness of left shoulder, not elsewhere classified: Secondary | ICD-10-CM | POA: Diagnosis present

## 2022-01-06 NOTE — Patient Instructions (Signed)
Access Code: [email or web address] URL: [email or web address] Date: 01/06/2022 Prepared by: Cheral Almas  Exercises - Supine Shoulder Alphabet  - 1 x daily - 7 x weekly - Supine Single Arm Shoulder Protraction  - 1 x daily - 7 x weekly - 10 reps

## 2022-01-06 NOTE — Therapy (Signed)
OUTPATIENT PHYSICAL THERAPY TREATMENT NOTE   Patient Name: Beth Stephens MRN: [id number] DOB:06/17/1975, 46 y.o., female Today's Date: 01/06/2022  PCP: Janie Morning, DO REFERRING PROVIDER: Eppie Gibson, MD  END OF SESSION:   PT End of Session - 01/06/22 1507     Visit Number 16    Number of Visits 23    Date for PT Re-Evaluation 02/17/22    Authorization Type [id number]    PT Start Time 1508    PT Stop Time 1552    PT Time Calculation (min) 44 min    Activity Tolerance Patient tolerated treatment well    Behavior During Therapy WFL for tasks assessed/performed                   Past Medical History:  Diagnosis Date   Cancer ([place])    Breast Cancer   Colitis    Colitis    Past Surgical History:  Procedure Laterality Date   BREAST LUMPECTOMY WITH RADIOACTIVE SEED LOCALIZATION Left 05/01/2021   Procedure: LEFT BREAST LUMPECTOMY WITH RADIOACTIVE SEED LOCALIZATION;  Surgeon: Stark Klein, MD;  Location: [facility];  Service: General;  Laterality: Left;   COLONOSCOPY     WISDOM TOOTH EXTRACTION     Patient Active Problem List   Diagnosis Date Noted   Cancer of left breast ([place]) 11/25/2021   Iron deficiency anemia due to chronic blood loss 05/24/2021   Genetic testing 04/22/2021   Ductal carcinoma in situ (DCIS) of left breast 04/08/2021    REFERRING DIAG: DCIS Lt breast  THERAPY DIAG:  Stiffness of left shoulder, not elsewhere classified  Breast edema  Nontraumatic hematoma of soft tissue  Ductal carcinoma in situ (DCIS) of left breast  PERTINENT HISTORY: Left lumpectomy 05/01/21 no SLNB with Dr. Barry Dienes.  Large hematoma formation. Radiation completed  PRECAUTIONS: Other: very small lymphedema risk Lt UE and breast due to radiation  SUBJECTIVE:  Over the last couple of days my mid back has hurt mainly when I lay on my back. I overdid it with exercises mid last week and my left arm felt very heavy. Ribs are improving and not pulling as much when I raise my  arm. The pinching pain still happens but is much lower in intensity when it happens.  PAIN:  Are you having pain? yes-  it is  0/10 in shoulder today at rest.. Shoulder can still get a painful pinch with certain motions, but is doing well with most routine activities and intensity is much better Pain location: anterior/posterior shoulder Pain description: sharp  Aggravating factors: sleeping on it Relieving factors: rest   OBJECTIVE:  PALPATION: Very hard mandarin orange size hematoma under lateral breast incision, +2 ttp axillary palpation with some puffiness here.  Overall breast not swollen.   12/23/2021 Hematoma is softer with less defined borders, pt is very tender today at lateral lower ribcage   OBSERVATIONS / OTHER ASSESSMENTS: 07/22/21: radiated field still red and healing especially inferior to breast, and peeling blister area in the axilla.        07/02/21: healing hematoma Lt lateral breast, puffiness Lt axilla, wearing soft cotton eye clasp front bra which has no compression but pt reports is comfortable and the only fabric that is okay to wear currently. 11/04/21- hematoma still firm and palpable in left lateral breast, educated pt to try and sleep in her compression bra   UPPER EXTREMITY AROM/PROM:   A/PROM RIGHT  07/02/2021     Shoulder extension 53   Shoulder flexion  174   Shoulder abduction 165   Shoulder internal rotation Behind the back to T6   Shoulder external rotation 90                           (Blank rows = not tested)   A/PROM LEFT  07/02/2021 07/22/21 08/13/21 08/28/21 11/04/21 12/23/2021  Shoulder extension 54    66 60  Shoulder flexion  145 - ache top of shoulde 145 - ache top of shoulde 155 - with top of the shoulder pinch 155 - no pinch  151 - feels pain coming on 155 pulls at rib  Shoulder abduction 160 155 - ache top of the shoulder  155 - ache top of the shoulder  167- no pinch 150 - feels pain coming on 150 with pinch at end range  Shoulder internal rotation  Behind the back to T4     T8 with pinch  Shoulder external rotation 90     94 with pinch at endrange                          (Blank rows = not tested)   TODAY'S TREATMENT   01/06/2022  STM to  left Pectorals, UT,Lt lats and serratus and rhomboids in supine and Rt S/L, with cocoa butter  Supine wand flexion and scaption x 5 Supine ABC  x 1, supine punches x 15, supine chest press x 10 Rhythmic stabs at 90 degrees 2 x 30 sec,  alternating isometrics IR/ER 2 x 30 sec Supine horizontal abduction yellow x 10  12/23/2021  STM to  left Pectorals, UT,Lt lats and serratus and briefly to rhomboids in supine and Rt S/L, with cocoa butter  Supine wand flexion and scaption x , PROM to Lt shoulder into flexion, abduction and D2 flex and ER. Lower trunk rotation with arms outstretched and in goal post position x3, standing left SB stretch x 3 Measured bilateral shoulder ROM for recert Incision area hematoma feels softer with less defined borders  12/09/2021 STM to  left Pectorals, UT,Lt lats and serratus and briefly to rhomboids in supine and Rt S/L, with cocoa butter  Supine wand flexion and scaption x 3, PROM to Lt shoulder into flexion, abduction and D2 flex and ER MLD: Lt inguinal nodes, Lt axillo-inguinal anastomosis and Lt lateral trunk and breast  where fullness palpable Spaghetti foam pad in stockinette given to pt to place in bra in area of hematoma to try and soften  11/22/2021 STM to  left Pectorals, UT,Lt lats and serratus and briefly to rhomboids in supine and Rt S/L, with cocoa butter  Supine wand flexion and scaption x 5, Lower trunk rotation x 5 knees to the right with arms extendend and goal post x 3 ea PROM to Lt shoulder into flexion, abduction and D2 flex and ER MLD: Lt inguinal nodes, Lt axillo-inguinal anastomosis and Lt lateral trunk where fullness palpable    11/11/21 Manual Therapy STM to Lt lats and serratus and briefly to rhomboids in supine and Rt S/L, with cocoa  butter when in S/L PROM to Lt shoulder into flexion, abduction and D2 to pts tolerance while providing manual pressure to help improve scapular depression, pt did reports some pinching at times but this seemed to improve as did her end P/ROM by end of session MLD: Lt inguinal nodes, Lt axillo-inguinal anastomosis and Lt lateral trunk where fullness palpable  11/04/21 Instructed pt  in supine scapular series with yellow theraband as follows: narrow and wide grip flexion x 5 each, horizontal abduction x 5, ER x 5, diagonals x 5 and issued instructions for pt to continue with these at home STM to L lats and serratus and briefly to rhomboids PROM to L shoulder while providing manual pressure to help improve scapular depression  08/28/21 Reviewed final HEP in case of this being the last day.  Performed each per below without sig vcs needed now for completion.  STM Lt UT, anterior/lateral shoulder, pectoralis, and latissimus, supraspinatus, and levator scapulae with TPR at levator today with referred pain here Gr. 3/4 GH Posterior and inferior mobs in various positions PROM flexion, scaption abduction with emphasis on scapular depression and avoiding pinch    PATIENT EDUCATION:  Access Code: [email or web address] URL: [email or web address] Date: 01/06/2022 Prepared by: Alvira Monday  Exercises - Supine Shoulder Alphabet  - 1 x daily - 7 x weekly - Supine Single Arm Shoulder Protraction  - 1 x daily - 7 x weekly - 10 reps  Education details: theraband exs SR, IR and ER with yellow Person educated: Patient Education method: Explanation, Demonstration, Tactile cues, Verbal cues, and Handouts Education comprehension: verbalized understanding, returned demonstration, verbal cues required, and needs further education    HOME EXERCISE PROGRAM: Access Code: [email or web address] URL: [email or web address] Date: 08/28/2021 Prepared by: Gwenevere Abbot  Exercises - Serratus Forearm Push Up Plus at  Wall with Elbows - 1 x daily - 3 x weekly - 1 sets - 10 reps - no hold - Standing Shoulder Row with Anchored Resistance - 1 x daily - 3 x weekly - 1-3 sets - 10 reps - 3sec hold - Shoulder extension with resistance - Neutral - 1 x daily - 3 x weekly - 1-3 sets - 10 reps - 3sec hold - Standing Shoulder External Rotation with Resistance - 1 x daily - 3 x weekly - 1-3 sets - 10 reps - 3sec hold - Single Arm Doorway Pec Stretch at 90 Degrees Abduction - 1-2 x daily - 7 x weekly - 2 reps - 30-60sec hold  ASSESSMENT:   CLINICAL IMPRESSION: Continued with soft tissue mobilization, AAROM, and initiated strengthening .[facility] Very tight and tender at inferior scapular region today with manual work. Pt still getting pinching sensation in posterior shoulder with later repetitions of exercises Ie around rep 5 of serratus punches. Decreased control noted with serratus punches during lowering phase.  OBJECTIVE IMPAIRMENTS decreased knowledge of use of DME, decreased ROM, and increased edema.    ACTIVITY LIMITATIONS community activity and yard work.    PERSONAL FACTORS 1 comorbidity: hematoma complication  are also affecting patient's functional outcome.      REHAB POTENTIAL: Excellent   CLINICAL DECISION MAKING: Stable/uncomplicated   EVALUATION COMPLEXITY: Low  GOALS: Goals reviewed with patient? Yes     LONG TERM GOALS: Target date: 08/13/2021   Pt will improve Lt UE AROM to equal to the Rt UE and without pain Baseline:  Goal status: ONGOING   2.  Pt will be ind with self care regarding Lt breast hematoma including self massage, compression bra, and use of foam or swell spot Baseline:  Goal status: ONGOING - pt reports it has softened but it is still there  3. Pt will not demonstrate shooting breast  pains to allow improved comfort.  Baseline:  Goals status: MET if not sleeping on left side 4. Pt will be independent in HEP for left shoulder ROM and in particular strengthening  GOAL  STATUS : NEW PLAN: PT FREQUENCY: 1x/week   PT DURATION: 8 weeks prn   PLANNED INTERVENTIONS: Therapeutic exercises, Patient/Family education, Joint mobilization, DME instructions, Manual lymph drainage, Compression bandaging, scar mobilization, Taping, and Manual therapy, iontophoresis with dexamethasone*   PLAN FOR NEXT SESSION:  Pt will not be available next 2 weeks,STM, scapular and GH mob, working on increase AROM and decreasing shoulder pain with overhead reach; PROM,MLD lateral trunk prn, review theraband exs. and progress strength to prevent posterior impingement.       Claris Pong, PT 01/06/2022, 5:13 PM

## 2022-01-13 ENCOUNTER — Ambulatory Visit: Payer: BC Managed Care – PPO | Admitting: Hematology

## 2022-01-13 ENCOUNTER — Other Ambulatory Visit: Payer: BC Managed Care – PPO

## 2022-01-24 ENCOUNTER — Other Ambulatory Visit: Payer: Self-pay

## 2022-01-24 ENCOUNTER — Ambulatory Visit: Payer: Self-pay | Admitting: Hematology

## 2022-01-27 ENCOUNTER — Ambulatory Visit: Payer: 59

## 2022-01-27 ENCOUNTER — Inpatient Hospital Stay: Payer: 59 | Attending: Hematology

## 2022-01-27 ENCOUNTER — Inpatient Hospital Stay (HOSPITAL_BASED_OUTPATIENT_CLINIC_OR_DEPARTMENT_OTHER): Payer: 59 | Admitting: Hematology

## 2022-01-27 ENCOUNTER — Other Ambulatory Visit: Payer: Self-pay

## 2022-01-27 ENCOUNTER — Encounter: Payer: Self-pay | Admitting: Hematology

## 2022-01-27 VITALS — BP 115/69 | HR 89 | Temp 98.3°F | Resp 16 | Ht 63.0 in | Wt 121.6 lb

## 2022-01-27 DIAGNOSIS — D0512 Intraductal carcinoma in situ of left breast: Secondary | ICD-10-CM

## 2022-01-27 DIAGNOSIS — D500 Iron deficiency anemia secondary to blood loss (chronic): Secondary | ICD-10-CM

## 2022-01-27 DIAGNOSIS — Z79810 Long term (current) use of selective estrogen receptor modulators (SERMs): Secondary | ICD-10-CM | POA: Diagnosis not present

## 2022-01-27 DIAGNOSIS — Z9889 Other specified postprocedural states: Secondary | ICD-10-CM | POA: Diagnosis not present

## 2022-01-27 DIAGNOSIS — Z170 Estrogen receptor positive status [ER+]: Secondary | ICD-10-CM | POA: Insufficient documentation

## 2022-01-27 DIAGNOSIS — Z923 Personal history of irradiation: Secondary | ICD-10-CM | POA: Diagnosis not present

## 2022-01-27 DIAGNOSIS — E039 Hypothyroidism, unspecified: Secondary | ICD-10-CM

## 2022-01-27 DIAGNOSIS — M25612 Stiffness of left shoulder, not elsewhere classified: Secondary | ICD-10-CM

## 2022-01-27 DIAGNOSIS — M7981 Nontraumatic hematoma of soft tissue: Secondary | ICD-10-CM

## 2022-01-27 DIAGNOSIS — N6489 Other specified disorders of breast: Secondary | ICD-10-CM

## 2022-01-27 LAB — CBC WITH DIFFERENTIAL/PLATELET
Abs Immature Granulocytes: 0.02 10*3/uL (ref 0.00–0.07)
Basophils Absolute: 0 10*3/uL (ref 0.0–0.1)
Basophils Relative: 0 %
Eosinophils Absolute: 0.1 10*3/uL (ref 0.0–0.5)
Eosinophils Relative: 1 %
HCT: 38.6 % (ref 36.0–46.0)
Hemoglobin: 12.3 g/dL (ref 12.0–15.0)
Immature Granulocytes: 0 %
Lymphocytes Relative: 17 %
Lymphs Abs: 1.3 10*3/uL (ref 0.7–4.0)
MCH: 26.1 pg (ref 26.0–34.0)
MCHC: 31.9 g/dL (ref 30.0–36.0)
MCV: 82 fL (ref 80.0–100.0)
Monocytes Absolute: 0.7 10*3/uL (ref 0.1–1.0)
Monocytes Relative: 9 %
Neutro Abs: 5.5 10*3/uL (ref 1.7–7.7)
Neutrophils Relative %: 73 %
Platelets: 174 10*3/uL (ref 150–400)
RBC: 4.71 MIL/uL (ref 3.87–5.11)
RDW: 13.4 % (ref 11.5–15.5)
WBC: 7.6 10*3/uL (ref 4.0–10.5)
nRBC: 0 % (ref 0.0–0.2)

## 2022-01-27 LAB — COMPREHENSIVE METABOLIC PANEL
ALT: 14 U/L (ref 0–44)
AST: 19 U/L (ref 15–41)
Albumin: 4.2 g/dL (ref 3.5–5.0)
Alkaline Phosphatase: 57 U/L (ref 38–126)
Anion gap: 5 (ref 5–15)
BUN: 12 mg/dL (ref 6–20)
CO2: 30 mmol/L (ref 22–32)
Calcium: 9.4 mg/dL (ref 8.9–10.3)
Chloride: 108 mmol/L (ref 98–111)
Creatinine, Ser: 0.8 mg/dL (ref 0.44–1.00)
GFR, Estimated: >60 mL/min (ref >60)
Glucose, Bld: 83 mg/dL (ref 70–99)
Potassium: 3.7 mmol/L (ref 3.5–5.1)
Sodium: 143 mmol/L (ref 135–145)
Total Bilirubin: 0.5 mg/dL (ref 0.3–1.2)
Total Protein: 7.6 g/dL (ref 6.5–8.1)

## 2022-01-27 LAB — IRON AND IRON BINDING CAPACITY (CC-WL,HP ONLY)
Iron: 107 ug/dL (ref 28–170)
Saturation Ratios: 29 % (ref 10.4–31.8)
TIBC: 375 ug/dL (ref 250–450)
UIBC: 268 ug/dL (ref 148–442)

## 2022-01-27 LAB — FERRITIN: Ferritin: 45 ng/mL (ref 11–307)

## 2022-01-27 LAB — VITAMIN D 25 HYDROXY (VIT D DEFICIENCY, FRACTURES): Vit D, 25-Hydroxy: 29.51 ng/mL — ABNORMAL LOW (ref 30–100)

## 2022-01-27 NOTE — Therapy (Signed)
OUTPATIENT PHYSICAL THERAPY TREATMENT NOTE   Patient Name: Beth Stephens MRN: [id number] DOB:1975/10/07, 46 y.o., female Today's Date: 01/27/2022  PCP: Janie Morning, DO REFERRING PROVIDER: Eppie Gibson, MD  END OF SESSION:   PT End of Session - 01/27/22 1602     Visit Number 17    Number of Visits 23    Date for PT Re-Evaluation 02/17/22    Authorization Type [id number]    PT Start Time 1603    PT Stop Time 1653    PT Time Calculation (min) 50 min    Activity Tolerance Patient tolerated treatment well    Behavior During Therapy WFL for tasks assessed/performed                   Past Medical History:  Diagnosis Date   Cancer ([place])    Breast Cancer   Colitis    Colitis    Past Surgical History:  Procedure Laterality Date   BREAST LUMPECTOMY WITH RADIOACTIVE SEED LOCALIZATION Left 05/01/2021   Procedure: LEFT BREAST LUMPECTOMY WITH RADIOACTIVE SEED LOCALIZATION;  Surgeon: Stark Klein, MD;  Location: [facility];  Service: General;  Laterality: Left;   COLONOSCOPY     WISDOM TOOTH EXTRACTION     Patient Active Problem List   Diagnosis Date Noted   Cancer of left breast ([place]) 11/25/2021   Iron deficiency anemia due to chronic blood loss 05/24/2021   Genetic testing 04/22/2021   Ductal carcinoma in situ (DCIS) of left breast 04/08/2021    REFERRING DIAG: DCIS Lt breast  THERAPY DIAG:  Stiffness of left shoulder, not elsewhere classified  Breast edema  Nontraumatic hematoma of soft tissue  Ductal carcinoma in situ (DCIS) of left breast  PERTINENT HISTORY: Left lumpectomy 05/01/21 no SLNB with Dr. Barry Dienes.  Large hematoma formation. Radiation completed  PRECAUTIONS: Other: very small lymphedema risk Lt UE and breast due to radiation  SUBJECTIVE: The mid back pain is much better. The pinching pain is still there especially if I reach into the back seat. Sore in the shoulder blade area and upper back. I drove from [place] this am. PAIN:  Are you having  pain? yes-  it is  0/10 in shoulder today at rest.. Shoulder can still get a painful pinch with certain motions, but is doing well with most routine activities and intensity is much better Pain location: anterior/posterior shoulder Pain description: sharp  Aggravating factors: sleeping on it Relieving factors: rest   OBJECTIVE:  PALPATION: Very hard mandarin orange size hematoma under lateral breast incision, +2 ttp axillary palpation with some puffiness here.  Overall breast not swollen.   12/23/2021 Hematoma is softer with less defined borders, pt is very tender today at lateral lower ribcage   OBSERVATIONS / OTHER ASSESSMENTS: 07/22/21: radiated field still red and healing especially inferior to breast, and peeling blister area in the axilla.        07/02/21: healing hematoma Lt lateral breast, puffiness Lt axilla, wearing soft cotton eye clasp front bra which has no compression but pt reports is comfortable and the only fabric that is okay to wear currently. 11/04/21- hematoma still firm and palpable in left lateral breast, educated pt to try and sleep in her compression bra   UPPER EXTREMITY AROM/PROM:   A/PROM RIGHT  07/02/2021     Shoulder extension 53   Shoulder flexion 174   Shoulder abduction 165   Shoulder internal rotation Behind the back to T6   Shoulder external rotation 90                           (  Blank rows = not tested)   A/PROM LEFT  07/02/2021 07/22/21 08/13/21 08/28/21 11/04/21 12/23/2021  Shoulder extension 54    66 60  Shoulder flexion  145 - ache top of shoulde 145 - ache top of shoulde 155 - with top of the shoulder pinch 155 - no pinch  151 - feels pain coming on 155 pulls at rib  Shoulder abduction 160 155 - ache top of the shoulder  155 - ache top of the shoulder  167- no pinch 150 - feels pain coming on 150 with pinch at end range  Shoulder internal rotation Behind the back to T4     T8 with pinch  Shoulder external rotation 90     94 with pinch at endrange                           (Blank rows = not tested)   TODAY'S TREATMENT   01/27/2022  STM to  left Pectorals, UT,Lt lats and serratus and rhomboids in supine and Rt S/L, with cocoa butter  Supine AROM shoulder Flexion, scaption, horizontal abd x 5 SL ER with 1 # x 10, alphabet 1 # A-Z, supine punches x 10 0# Supine horizontal abd with yellow x 10 Alternating isometrics and rhythmic stabs 2 x 30 sec   01/06/2022  STM to  left Pectorals, UT,Lt lats and serratus and rhomboids in supine and Rt S/L, with cocoa butter  Supine wand flexion and scaption x 5 Supine ABC  x 1, supine punches x 15, supine chest press x 10 Rhythmic stabs at 90 degrees 2 x 30 sec,  alternating isometrics IR/ER 2 x 30 sec Supine horizontal abduction yellow x 10  12/23/2021  STM to  left Pectorals, UT,Lt lats and serratus and briefly to rhomboids in supine and Rt S/L, with cocoa butter  Supine wand flexion and scaption x , PROM to Lt shoulder into flexion, abduction and D2 flex and ER. Lower trunk rotation with arms outstretched and in goal post position x3, standing left SB stretch x 3 Measured bilateral shoulder ROM for recert Incision area hematoma feels softer with less defined borders  12/09/2021 STM to  left Pectorals, UT,Lt lats and serratus and briefly to rhomboids in supine and Rt S/L, with cocoa butter  Supine wand flexion and scaption x 3, PROM to Lt shoulder into flexion, abduction and D2 flex and ER MLD: Lt inguinal nodes, Lt axillo-inguinal anastomosis and Lt lateral trunk and breast  where fullness palpable Spaghetti foam pad in stockinette given to pt to place in bra in area of hematoma to try and soften  11/22/2021 STM to  left Pectorals, UT,Lt lats and serratus and briefly to rhomboids in supine and Rt S/L, with cocoa butter  Supine wand flexion and scaption x 5, Lower trunk rotation x 5 knees to the right with arms extendend and goal post x 3 ea PROM to Lt shoulder into flexion, abduction and D2 flex and  ER MLD: Lt inguinal nodes, Lt axillo-inguinal anastomosis and Lt lateral trunk where fullness palpable    11/11/21 Manual Therapy STM to Lt lats and serratus and briefly to rhomboids in supine and Rt S/L, with cocoa butter when in S/L PROM to Lt shoulder into flexion, abduction and D2 to pts tolerance while providing manual pressure to help improve scapular depression, pt did reports some pinching at times but this seemed to improve as did her end P/ROM by end of session MLD:  Lt inguinal nodes, Lt axillo-inguinal anastomosis and Lt lateral trunk where fullness palpable  11/04/21 Instructed pt in supine scapular series with yellow theraband as follows: narrow and wide grip flexion x 5 each, horizontal abduction x 5, ER x 5, diagonals x 5 and issued instructions for pt to continue with these at home STM to L lats and serratus and briefly to rhomboids PROM to L shoulder while providing manual pressure to help improve scapular depression  08/28/21 Reviewed final HEP in case of this being the last day.  Performed each per below without sig vcs needed now for completion.  STM Lt UT, anterior/lateral shoulder, pectoralis, and latissimus, supraspinatus, and levator scapulae with TPR at levator today with referred pain here Gr. 3/4 GH Posterior and inferior mobs in various positions PROM flexion, scaption abduction with emphasis on scapular depression and avoiding pinch    PATIENT EDUCATION:  Access Code: [email or web address] URL: [email or web address] Date: 01/06/2022 Prepared by: Cheral Almas  Exercises - Supine Shoulder Alphabet  - 1 x daily - 7 x weekly - Supine Single Arm Shoulder Protraction  - 1 x daily - 7 x weekly - 10 reps  Education details: theraband exs SR, IR and ER with yellow Person educated: Patient Education method: Explanation, Demonstration, Tactile cues, Verbal cues, and Handouts Education comprehension: verbalized understanding, returned demonstration, verbal cues  required, and needs further education    HOME EXERCISE PROGRAM: Access Code: [email or web address] URL: [email or web address] Date: 08/28/2021 Prepared by: Shan Levans  Exercises - Serratus Forearm Push Up Plus at Wall with Elbows - 1 x daily - 3 x weekly - 1 sets - 10 reps - no hold - Standing Shoulder Row with Anchored Resistance - 1 x daily - 3 x weekly - 1-3 sets - 10 reps - 3sec hold - Shoulder extension with resistance - Neutral - 1 x daily - 3 x weekly - 1-3 sets - 10 reps - 3sec hold - Standing Shoulder External Rotation with Resistance - 1 x daily - 3 x weekly - 1-3 sets - 10 reps - 3sec hold - Single Arm Doorway Pec Stretch at 90 Degrees Abduction - 1-2 x daily - 7 x weekly - 2 reps - 30-60sec hold  ASSESSMENT:   CLINICAL IMPRESSION: Less tightness noted today with manual therapy work , but pt had some tenderness when working near the left side of the thoracic spine. Pts arm fatigued with exercises today but she did not experience any pinching sensations and was able to go nearly  full motion in supine for exercises.  OBJECTIVE IMPAIRMENTS decreased knowledge of use of DME, decreased ROM, and increased edema.    ACTIVITY LIMITATIONS community activity and yard work.    PERSONAL FACTORS 1 comorbidity: hematoma complication  are also affecting patient's functional outcome.      REHAB POTENTIAL: Excellent   CLINICAL DECISION MAKING: Stable/uncomplicated   EVALUATION COMPLEXITY: Low  GOALS: Goals reviewed with patient? Yes     LONG TERM GOALS: Target date: 08/13/2021   Pt will improve Lt UE AROM to equal to the Rt UE and without pain Baseline:  Goal status: ONGOING   2.  Pt will be ind with self care regarding Lt breast hematoma including self massage, compression bra, and use of foam or swell spot Baseline:  Goal status: ONGOING - pt reports it has softened but it is still there  3. Pt will not demonstrate shooting breast  pains to allow improved  comfort.  Baseline:  Goals status: MET if not sleeping on  left side 4. Pt will be independent in HEP for left shoulder ROM and in particular strengthening             GOAL STATUS : NEW PLAN: PT FREQUENCY: 1x/week   PT DURATION: 8 weeks prn   PLANNED INTERVENTIONS: Therapeutic exercises, Patient/Family education, Joint mobilization, DME instructions, Manual lymph drainage, Compression bandaging, scar mobilization, Taping, and Manual therapy, iontophoresis with dexamethasone*   PLAN FOR NEXT SESSION:  ,STM, scapular and GH mob, working on increase AROM and decreasing shoulder pain with overhead reach; PROM,MLD lateral trunk prn, review theraband exs. and progress strength to prevent posterior impingement, MLD lateral trunk/hematoma area prn.       Claris Pong, PT 01/27/2022, 5:04 PM

## 2022-01-27 NOTE — Progress Notes (Signed)
[place]   [phone] [phone]) [phone]   Clinic Follow up Note   Patient Care Team: Janie Morning, DO as PCP - General (Family Medicine) Mauro Kaufmann, RN as Oncology Nurse Navigator Rockwell Germany, RN as Oncology Nurse Navigator Sterling Big, MD as Referring Physician (Hematology) Stark Klein, MD as Consulting Physician (General Surgery) Eppie Gibson, MD as Attending Physician (Radiation Oncology) Alla Feeling, NP as Nurse Practitioner (Nurse Practitioner) Truitt Merle, MD as Consulting Physician (Hematology)  Date of Service:  01/27/2022  CHIEF COMPLAINT: f/u of left breast DCIS  CURRENT THERAPY:  Tamoxifen, started 07/2021, currently 5 mg every other day due to poor tolerance   ASSESSMENT & PLAN:  Beth Stephens is a 46 y.o. pre-menopausal female with   1. Left breast DCIS, grade 2, ER+/PR+ -found on screening mammogram. S/p left lumpectomy on 05/01/21 by Dr. Barry Dienes showed 1 cm DCIS, margins negative.  -Postop course was complicated by a large hematoma requiring drainage. -s/p radiation 06/24/21 - 07/15/21 under Dr. Isidore Moos. -she started tamoxifen 21m in 07/2021 but has not tolerated well with bothersome hot flashes and palpitation. She has reduced to 554m3-4 time a week. She would like to continue since her AEs are manageable with low dose.  -she is otherwise doing well from a breast cancer standpoint. Labs reviewed, all WNL. Physical exam was unremarkable. There is no clinical concern for recurrence. -she is now due for annual mammogram; I ordered today. We also discussed screening breast MRI. She is interested, and we will plan to start in 07/2022.   2.  history of symptomatic anemia from bleeding and mild iron deficiency, dyspnea on exertion  -she developed symptomatic anemia with dyspnea on exertion following surgery, hgb 10.5. -she started oral iron in early 05/2021 and was given IV Venofer on 05/24/21 and 05/29/21 with significant improvement   -anemia resolved, labs today (01/27/22) all WNL.   3.  Postsurgical bleeding, ruled out bleeding disorder -She has had easy bruising for a long time. She developed a large hematoma after left breast lumpectomy, required multiple draining -work up from 06/21/21 showed normal PT, APTT, and platelet count.  Platelet function assay and aggregation were normal, Von willebrand panel was normal. Additional labs from 08/12/21 showed Factor 8, 9, 11 and 13 all normal -[facility] evaluation, lab work up was negative. She has f/u with Dr. MaGaylyn Cheersext month      PLAN:  -continue tamoxifen, at decreased dose 5 mg every other day  -mammogram due, she will call [facility] to schedule  -lab and f/u in 6 months -start annual breast MRI in 07/2022. Due to high copay she will do abbreviated breast MRI   No problem-specific Assessment & Plan notes found for this encounter.   SUMMARY OF ONCOLOGIC HISTORY: Oncology History Overview Note   Cancer Staging  Ductal carcinoma in situ (DCIS) of left breast Staging form: Breast, AJCC 8th Edition - Clinical stage from 04/02/2021: Stage 0 (cTis (DCIS), cN0, cM0, G3, ER+, PR+, HER2: Not Assessed) - Signed by FeTruitt MerleMD on 04/09/2021 - Pathologic stage from 05/01/2021: Stage Unknown (pTis (DCIS), pNX, cM0, G2, ER+, PR+, HER2: Not Assessed) - Signed by FeTruitt MerleMD on 05/23/2021    Ductal carcinoma in situ (DCIS) of left breast  01/18/2021 Mammogram   Exam: 3D Mammogram Diagnostic Digital - L  IMPRESSION: The 0.3 cm grouped round calcifications in the left breast are suspicious.   04/02/2021 Initial Biopsy  Diagnosis 1. Breast, left, needle core biopsy, left breast calcification @ 3:00 6 cmfn  - DUCTAL CARCINOMA IN SITU WITH NECROSIS AND CALCIFICATIONS  - SEE COMMENT Microscopic Comment Immunohistochemistry was performed on the biopsy to assess for an invasive component (SMM, calponin, CD34 and p63). Myoepithelial cells are present  supporting the diagnosis of ductal carcinoma in situ. Based on the biopsy, the ductal carcinoma in situ cribriform and solid patterns, intermediate nuclear grade, and measures 0.6 cm in greatest linear extent.  1. PROGNOSTIC INDICATORS Results: Estrogen Receptor: 100%, POSITIVE, STRONG STAINING INTENSITY Progesterone Receptor: 70%, POSITIVE, STRONG STAINING INTENSITY   04/02/2021 Cancer Staging   Staging form: Breast, AJCC 8th Edition - Clinical stage from 04/02/2021: Stage 0 (cTis (DCIS), cN0, cM0, G3, ER+, PR+, HER2: Not Assessed) - Signed by Truitt Merle, MD on 04/09/2021 Stage prefix: Initial diagnosis Histologic grading system: 3 grade system   04/08/2021 Initial Diagnosis   Ductal carcinoma in situ (DCIS) of left breast    Genetic Testing   [facility] CancerNext-Expanded Panel was Negative. Report date is 04/20/2021.  The CancerNext-Expanded gene panel offered by [facility] and includes sequencing, rearrangement, and RNA analysis for the following 77 genes: AIP, ALK, APC, ATM, AXIN2, BAP1, BARD1, BLM, BMPR1A, BRCA1, BRCA2, BRIP1, CDC73, CDH1, CDK4, CDKN1B, CDKN2A, CHEK2, CTNNA1, DICER1, FANCC, FH, FLCN, GALNT12, KIF1B, LZTR1, MAX, MEN1, MET, MLH1, MSH2, MSH3, MSH6, MUTYH, NBN, NF1, NF2, NTHL1, PALB2, PHOX2B, PMS2, POT1, PRKAR1A, PTCH1, PTEN, RAD51C, RAD51D, RB1, RECQL, RET, SDHA, SDHAF2, SDHB, SDHC, SDHD, SMAD4, SMARCA4, SMARCB1, SMARCE1, STK11, SUFU, TMEM127, TP53, TSC1, TSC2, VHL and XRCC2 (sequencing and deletion/duplication); EGFR, EGLN1, HOXB13, KIT, MITF, PDGFRA, POLD1, and POLE (sequencing only); EPCAM and GREM1 (deletion/duplication only).    04/26/2021 Imaging   EXAM: BILATERAL BREAST MRI WITH AND WITHOUT CONTRAST  IMPRESSION: 1. No significant enhancement at the site of known ductal carcinoma in situ in the LATERAL portion of the LEFT breast. There is minimal enhancement along the biopsy tract in this region. 2. 0.8 centimeter indeterminate enhancing mass in the posterior LOWER INNER  QUADRANT of the LEFT breast. 3. RIGHT breast is negative.   05/01/2021 Cancer Staging   Staging form: Breast, AJCC 8th Edition - Pathologic stage from 05/01/2021: Stage Unknown (pTis (DCIS), pNX, cM0, G2, ER+, PR+, HER2: Not Assessed) - Signed by Truitt Merle, MD on 05/23/2021 Stage prefix: Initial diagnosis Histologic grading system: 3 grade system Residual tumor (R): R0 - None   05/01/2021 Pathology Results   FINAL MICROSCOPIC DIAGNOSIS:   A. BREAST, LEFT MEDIAL @ 7:30, EXCISION:  - Fibrocystic changes with calcifications.  - No malignancy identified.   B. BREAST, LEFT @ 3;00, LUMPECTOMY:  - Ductal carcinoma in situ, 1 cm.  - All surgical margins negative for DCIS.  - Fibrocystic changes with diffuse sclerosing adenosis and  calcifications.  - Biopsy site and biopsy clip.  - See oncology table.    06/24/2021 - 07/15/2021 Radiation Therapy   Completed 42.56 Pearline Cables in 16 fractions to the left breast by Dr. Isidore Moos   07/2021 -  Anti-estrogen oral therapy   Began adjuvant tamoxifen 10 mg p.o. once daily, goal 3-5 years   11/25/2021 Survivorship   SCP visit with Cira Rue, NP      INTERVAL HISTORY:  Beth Stephens is here for a follow up of DCIS. She was last seen by me on 07/12/21 with survivorship in the interim. She presents to the clinic alone. She reports she is not tolerating tamoxifen well with bothersome hot flashes. She explains it  affects her sleep.   All other systems were reviewed with the patient and are negative.  MEDICAL HISTORY:  Past Medical History:  Diagnosis Date   Cancer ([place])    Breast Cancer   Colitis    Colitis     SURGICAL HISTORY: Past Surgical History:  Procedure Laterality Date   BREAST LUMPECTOMY WITH RADIOACTIVE SEED LOCALIZATION Left 05/01/2021   Procedure: LEFT BREAST LUMPECTOMY WITH RADIOACTIVE SEED LOCALIZATION;  Surgeon: Stark Klein, MD;  Location: [facility];  Service: General;  Laterality: Left;   COLONOSCOPY     WISDOM TOOTH EXTRACTION       I have reviewed the social history and family history with the patient and they are unchanged from previous note.  ALLERGIES:  is allergic to gluten meal and sumatriptan.  MEDICATIONS:  Current Outpatient Medications  Medication Sig Dispense Refill   sulfaSALAzine (AZULFIDINE) 500 MG tablet Take 1,000 mg by mouth See admin instructions. Take 2 tablets (1000 mg) by mouth daily after lunch, may take 2 tablets (1000 mg) two more times in the day as needed for colitis flare     tamoxifen (NOLVADEX) 10 MG tablet TAKE 1 TABLET(10 MG) BY MOUTH DAILY 90 tablet 1   No current facility-administered medications for this visit.    PHYSICAL EXAMINATION: ECOG PERFORMANCE STATUS: 0 - Asymptomatic  Vitals:   01/27/22 1049  BP: 115/69  Pulse: 89  Resp: 16  Temp: 98.3 F (36.8 C)  SpO2: 100%   Wt Readings from Last 3 Encounters:  01/27/22 121 lb 9.6 oz (55.2 kg)  11/25/21 121 lb 3.2 oz (55 kg)  07/12/21 117 lb 6.4 oz (53.3 kg)     GENERAL:alert, no distress and comfortable SKIN: skin color, texture, turgor are normal, no rashes or significant lesions EYES: normal, Conjunctiva are pink and non-injected, sclera clear  NECK: supple, thyroid normal size, non-tender, without nodularity LYMPH:  no palpable lymphadenopathy in the cervical, axillary LUNGS: clear to auscultation and percussion with normal breathing effort HEART: regular rate & rhythm and no murmurs and no lower extremity edema ABDOMEN:abdomen soft, non-tender and normal bowel sounds Musculoskeletal:no cyanosis of digits and no clubbing  NEURO: alert & oriented x 3 with fluent speech, no focal motor/sensory deficits BREAST:  No palpable mass, nodules or adenopathy bilaterally. Breast exam benign.   LABORATORY DATA:  I have reviewed the data as listed    Latest Ref Rng & Units 01/27/2022   10:17 AM 08/12/2021    3:51 PM 06/21/2021   10:27 AM  CBC  WBC 4.0 - 10.5 K/uL 7.6  8.4  9.3   Hemoglobin 12.0 - 15.0 g/dL 12.3  12.8   11.8   Hematocrit 36.0 - 46.0 % 38.6  39.4  37.1   Platelets 150 - 400 K/uL 174  164  188         Latest Ref Rng & Units 01/27/2022   10:17 AM 06/21/2021   10:27 AM 05/24/2021   10:13 AM  CMP  Glucose 70 - 99 mg/dL 83  85  83   BUN 6 - 20 mg/dL _0 Creatinine 0.44 - 1.00 mg/dL 0.80  0.57  0.62   Sodium 135 - 145 mmol/L 143  139  138   Potassium 3.5 - 5.1 mmol/L 3.7  4.2  4.1   Chloride 98 - 111 mmol/L 108  107  105   CO2 22 - 32 mmol/L _1 Calcium 8.9 - 10.3 mg/dL  9.4  9.2  9.4   Total Protein 6.5 - 8.1 g/dL 7.6  7.0  7.3   Total Bilirubin 0.3 - 1.2 mg/dL 0.5  0.4  0.4   Alkaline Phos 38 - 126 U/L 57  59  61   AST 15 - 41 U/L _0 ALT 0 - 44 U/L _1 RADIOGRAPHIC STUDIES: I have personally reviewed the radiological images as listed and agreed with the findings in the report. No results found.    Orders Placed This Encounter  Procedures   MR BREAST BILATERAL W WO CONTRAST INC CAD    Standing Status:   Future    Standing Expiration Date:   01/28/2023    Order Specific Question:   If indicated for the ordered procedure, I authorize the administration of contrast media per Radiology protocol    Answer:   Yes    Order Specific Question:   What is the patient's sedation requirement?    Answer:   No Sedation    Order Specific Question:   Does the patient have a pacemaker or implanted devices?    Answer:   No    Order Specific Question:   Radiology Contrast Protocol - do NOT remove file path    Answer:   [email or web address]    Order Specific Question:   Preferred imaging location?    Answer:   [facility] (table limit-550lbs)   MM DIAG BREAST TOMO BILATERAL    Standing Status:   Future    Standing Expiration Date:   01/28/2023    Scheduling Instructions:     [facility]    Order Specific Question:   Reason for Exam (SYMPTOM  OR DIAGNOSIS REQUIRED)    Answer:   screening    Order Specific  Question:   Preferred imaging location?    Answer:   External    Order Specific Question:   Release to patient    Answer:   Immediate    Order Specific Question:   Is the patient pregnant?    Answer:   No   TSH    Standing Status:   Standing    Number of Occurrences:   10    Standing Expiration Date:   01/28/2023   All questions were answered. The patient knows to call the clinic with any problems, questions or concerns. No barriers to learning was detected. The total time spent in the appointment was 20 minutes.     Truitt Merle, MD 01/27/2022   I, Wilburn Mylar, am acting as scribe for Truitt Merle, MD.   I have reviewed the above documentation for accuracy and completeness, and I agree with the above.

## 2022-01-28 ENCOUNTER — Telehealth: Payer: Self-pay | Admitting: Hematology

## 2022-01-28 NOTE — Telephone Encounter (Signed)
Per 10/30 los called and left message for pt about appointment

## 2022-02-03 ENCOUNTER — Telehealth: Payer: Self-pay | Admitting: *Deleted

## 2022-02-03 ENCOUNTER — Ambulatory Visit: Payer: 59 | Attending: Radiation Oncology

## 2022-02-03 DIAGNOSIS — D0512 Intraductal carcinoma in situ of left breast: Secondary | ICD-10-CM | POA: Diagnosis present

## 2022-02-03 DIAGNOSIS — M7981 Nontraumatic hematoma of soft tissue: Secondary | ICD-10-CM | POA: Diagnosis present

## 2022-02-03 DIAGNOSIS — N6489 Other specified disorders of breast: Secondary | ICD-10-CM | POA: Diagnosis present

## 2022-02-03 DIAGNOSIS — M25612 Stiffness of left shoulder, not elsewhere classified: Secondary | ICD-10-CM | POA: Insufficient documentation

## 2022-02-03 NOTE — Telephone Encounter (Signed)
-----   Message from Alla Feeling, NP sent at 02/02/2022  4:45 PM EST ----- Please call Dr. Garnet Koyanagi to let her know CMP, CBC, and iron/ferritin are all normal. Vit D is 29 which is just below normal. I would recommend high dose 50K IU weekly for 3 months. But I am open if she prefers to try another dose first, she is an endocrinologist. If she agrees I will prescribe it.  Thanks, Regan Rakers, NP

## 2022-02-03 NOTE — Telephone Encounter (Signed)
Communicated lab values to patient.  She would like to have the Vitamin D prescribed to pharmacy on file.  Communicated that to [place].

## 2022-02-03 NOTE — Therapy (Signed)
OUTPATIENT PHYSICAL THERAPY TREATMENT NOTE   Patient Name: Beth Stephens MRN: [id number] DOB:1975-12-02, 46 y.o., female Today's Date: 02/03/2022  PCP: Janie Morning, DO REFERRING PROVIDER: Eppie Gibson, MD  END OF SESSION:   PT End of Session - 02/03/22 1707     Visit Number 18    Number of Visits 23    Date for PT Re-Evaluation 02/17/22    PT Start Time 1606    PT Stop Time 1702    PT Time Calculation (min) 56 min    Activity Tolerance Patient tolerated treatment well    Behavior During Therapy WFL for tasks assessed/performed                   Past Medical History:  Diagnosis Date   Cancer ([place])    Breast Cancer   Colitis    Colitis    Past Surgical History:  Procedure Laterality Date   BREAST LUMPECTOMY WITH RADIOACTIVE SEED LOCALIZATION Left 05/01/2021   Procedure: LEFT BREAST LUMPECTOMY WITH RADIOACTIVE SEED LOCALIZATION;  Surgeon: Stark Klein, MD;  Location: [facility];  Service: General;  Laterality: Left;   COLONOSCOPY     WISDOM TOOTH EXTRACTION     Patient Active Problem List   Diagnosis Date Noted   Cancer of left breast ([place]) 11/25/2021   Iron deficiency anemia due to chronic blood loss 05/24/2021   Genetic testing 04/22/2021   Ductal carcinoma in situ (DCIS) of left breast 04/08/2021    REFERRING DIAG: DCIS Lt breast  THERAPY DIAG:  Stiffness of left shoulder, not elsewhere classified  Breast edema  Nontraumatic hematoma of soft tissue  Ductal carcinoma in situ (DCIS) of left breast  PERTINENT HISTORY: Left lumpectomy 05/01/21 no SLNB with Dr. Barry Dienes.  Large hematoma formation. Radiation completed  PRECAUTIONS: Other: very small lymphedema risk Lt UE and breast due to radiation  SUBJECTIVE:  Did fine after last visit. Reaching into the back seat today was really painful. It took several minutes for the pinch pain to go away. It feels OK now.I am not catching with reaching up  anymore.  Are you having pain? yes-  it is  0/10 in  shoulder today at rest.. Shoulder can still get a painful pinch with certain motions, but is doing well with most routine activities and intensity is much better overall Pain location: anterior/posterior shoulder Pain description: sharp  Aggravating factors: sleeping on it Relieving factors: rest   OBJECTIVE:  PALPATION: Very hard mandarin orange size hematoma under lateral breast incision, +2 ttp axillary palpation with some puffiness here.  Overall breast not swollen.   12/23/2021 Hematoma is softer with less defined borders, pt is very tender today at lateral lower ribcage   OBSERVATIONS / OTHER ASSESSMENTS: 07/22/21: radiated field still red and healing especially inferior to breast, and peeling blister area in the axilla.        07/02/21: healing hematoma Lt lateral breast, puffiness Lt axilla, wearing soft cotton eye clasp front bra which has no compression but pt reports is comfortable and the only fabric that is okay to wear currently. 11/04/21- hematoma still firm and palpable in left lateral breast, educated pt to try and sleep in her compression bra   UPPER EXTREMITY AROM/PROM:   A/PROM RIGHT  07/02/2021     Shoulder extension 53   Shoulder flexion 174   Shoulder abduction 165   Shoulder internal rotation Behind the back to T6   Shoulder external rotation 90                           (  Blank rows = not tested)   A/PROM LEFT  07/02/2021 07/22/21 08/13/21 08/28/21 11/04/21 12/23/2021  Shoulder extension 54    66 60  Shoulder flexion  145 - ache top of shoulde 145 - ache top of shoulde 155 - with top of the shoulder pinch 155 - no pinch  151 - feels pain coming on 155 pulls at rib  Shoulder abduction 160 155 - ache top of the shoulder  155 - ache top of the shoulder  167- no pinch 150 - feels pain coming on 150 with pinch at end range  Shoulder internal rotation Behind the back to T4     T8 with pinch  Shoulder external rotation 90     94 with pinch at endrange                          (Blank  rows = not tested)   TODAY'S TREATMENT   02/03/2022 STM to  left Pectorals, UT,Lt lats and serratus and rhomboids in supine and Rt S/L, with cocoa butter  Supine wand flexion and scaption x 4 ea Supine AROM shoulder Flexion, scaption, horizontal abd x 5 SL ER x 15 no resistance. Tried 1# wt but was painful, supine alphabet 1# A-Z, tried serratus punches but held secondary to increased achiness Standing scapular retraction and shoulder extension with red band x 10 reps, bilateral ER with yellow x 10. Ball rolls on wall x 10-15 reps up and down, side to side, clockwise and counter clockwise. Pt was very fatigued especially with ball rolls  01/27/2022 STM to  left Pectorals, UT,Lt lats and serratus and rhomboids in supine and Rt S/L, with cocoa butter  Supine AROM shoulder Flexion, scaption, horizontal abd x 5 SL ER with 1 # x 10, alphabet 1 # A-Z, supine punches x 10 0# Supine horizontal abd with yellow x 10 Alternating isometrics and rhythmic stabs 2 x 30 sec 01/06/2022  STM to  left Pectorals, UT,Lt lats and serratus and rhomboids in supine and Rt S/L, with cocoa butter  Supine wand flexion and scaption x 5 Supine ABC  x 1, supine punches x 15, supine chest press x 10 Rhythmic stabs at 90 degrees 2 x 30 sec,  alternating isometrics IR/ER 2 x 30 sec Supine horizontal abduction yellow x 10  12/23/2021  STM to  left Pectorals, UT,Lt lats and serratus and briefly to rhomboids in supine and Rt S/L, with cocoa butter  Supine wand flexion and scaption x , PROM to Lt shoulder into flexion, abduction and D2 flex and ER. Lower trunk rotation with arms outstretched and in goal post position x3, standing left SB stretch x 3 Measured bilateral shoulder ROM for recert Incision area hematoma feels softer with less defined borders  12/09/2021 STM to  left Pectorals, UT,Lt lats and serratus and briefly to rhomboids in supine and Rt S/L, with cocoa butter  Supine wand flexion and scaption x 3, PROM to  Lt shoulder into flexion, abduction and D2 flex and ER MLD: Lt inguinal nodes, Lt axillo-inguinal anastomosis and Lt lateral trunk and breast  where fullness palpable Spaghetti foam pad in stockinette given to pt to place in bra in area of hematoma to try and soften  11/22/2021 STM to  left Pectorals, UT,Lt lats and serratus and briefly to rhomboids in supine and Rt S/L, with cocoa butter  Supine wand flexion and scaption x 5, Lower trunk rotation x 5 knees to the right with  arms extendend and goal post x 3 ea PROM to Lt shoulder into flexion, abduction and D2 flex and ER MLD: Lt inguinal nodes, Lt axillo-inguinal anastomosis and Lt lateral trunk where fullness palpable    11/11/21 Manual Therapy STM to Lt lats and serratus and briefly to rhomboids in supine and Rt S/L, with cocoa butter when in S/L PROM to Lt shoulder into flexion, abduction and D2 to pts tolerance while providing manual pressure to help improve scapular depression, pt did reports some pinching at times but this seemed to improve as did her end P/ROM by end of session MLD: Lt inguinal nodes, Lt axillo-inguinal anastomosis and Lt lateral trunk where fullness palpable    PATIENT EDUCATION:  Access Code: [email or web address] URL: [email or web address] Date: 01/06/2022 Prepared by: Cheral Almas  Exercises - Supine Shoulder Alphabet  - 1 x daily - 7 x weekly - Supine Single Arm Shoulder Protraction  - 1 x daily - 7 x weekly - 10 reps  Education details: theraband exs SR, IR and ER with yellow Person educated: Patient Education method: Explanation, Demonstration, Tactile cues, Verbal cues, and Handouts Education comprehension: verbalized understanding, returned demonstration, verbal cues required, and needs further education    HOME EXERCISE PROGRAM: Access Code: [email or web address] URL: [email or web address] Date: 08/28/2021 Prepared by: Shan Levans  Exercises - Serratus Forearm Push Up Plus at Wall with  Elbows - 1 x daily - 3 x weekly - 1 sets - 10 reps - no hold - Standing Shoulder Row with Anchored Resistance - 1 x daily - 3 x weekly - 1-3 sets - 10 reps - 3sec hold - Shoulder extension with resistance - Neutral - 1 x daily - 3 x weekly - 1-3 sets - 10 reps - 3sec hold - Standing Shoulder External Rotation with Resistance - 1 x daily - 3 x weekly - 1-3 sets - 10 reps - 3sec hold - Single Arm Doorway Pec Stretch at 90 Degrees Abduction - 1-2 x daily - 7 x weekly - 2 reps - 30-60sec hold  ASSESSMENT:   CLINICAL IMPRESSION: Pt experienced a bad pinch in the left posterior shoulder when reaching behind her with her left hand to get her purse between the door and the seat of her car.. She felt better after treatment, however, treatment was modified secondary to pain with serratus punches, SL ER with wt.due to increased achiness. She was tight again today especially in the left lower scapular area. She felt really good at completion of rx. She is not able to be seen for the next couple of weeks. She will add ball rolls on wall to her HEP OBJECTIVE IMPAIRMENTS decreased knowledge of use of DME, decreased ROM, and increased edema.    ACTIVITY LIMITATIONS community activity and yard work.    PERSONAL FACTORS 1 comorbidity: hematoma complication  are also affecting patient's functional outcome.      REHAB POTENTIAL: Excellent   CLINICAL DECISION MAKING: Stable/uncomplicated   EVALUATION COMPLEXITY: Low  GOALS: Goals reviewed with patient? Yes     LONG TERM GOALS: Target date: 08/13/2021   Pt will improve Lt UE AROM to equal to the Rt UE and without pain Baseline:  Goal status: ONGOING   2.  Pt will be ind with self care regarding Lt breast hematoma including self massage, compression bra, and use of foam or swell spot Baseline:  Goal status: ONGOING - pt reports it has softened but it is still there  3. Pt will not demonstrate shooting breast  pains to  allow improved  comfort.  Baseline:  Goals status: MET if not sleeping on left side 4. Pt will be independent in HEP for left shoulder ROM and in particular strengthening             GOAL STATUS : NEW PLAN: PT FREQUENCY: 1x/week   PT DURATION: 8 weeks prn   PLANNED INTERVENTIONS: Therapeutic exercises, Patient/Family education, Joint mobilization, DME instructions, Manual lymph drainage, Compression bandaging, scar mobilization, Taping, and Manual therapy, iontophoresis with dexamethasone*   PLAN FOR NEXT SESSION:  ,STM, scapular and GH mob, working on increase AROM and decreasing shoulder pain with overhead reach; PROM,MLD lateral trunk prn, review theraband exs. and progress strength to prevent posterior impingement, MLD lateral trunk/hematoma area prn.       Claris Pong, PT 02/03/2022, 5:21 PM

## 2022-02-04 ENCOUNTER — Other Ambulatory Visit: Payer: Self-pay | Admitting: Nurse Practitioner

## 2022-02-04 DIAGNOSIS — E559 Vitamin D deficiency, unspecified: Secondary | ICD-10-CM

## 2022-02-04 MED ORDER — ERGOCALCIFEROL 1.25 MG (50000 UT) PO CAPS: 50000.0000 [IU] | ORAL_CAPSULE | ORAL | 0 refills | Status: DC

## 2022-02-28 ENCOUNTER — Ambulatory Visit: Payer: 59 | Attending: Radiation Oncology

## 2022-02-28 DIAGNOSIS — M25612 Stiffness of left shoulder, not elsewhere classified: Secondary | ICD-10-CM | POA: Diagnosis present

## 2022-02-28 DIAGNOSIS — D0512 Intraductal carcinoma in situ of left breast: Secondary | ICD-10-CM | POA: Insufficient documentation

## 2022-02-28 DIAGNOSIS — M7981 Nontraumatic hematoma of soft tissue: Secondary | ICD-10-CM | POA: Insufficient documentation

## 2022-02-28 DIAGNOSIS — N6489 Other specified disorders of breast: Secondary | ICD-10-CM | POA: Diagnosis present

## 2022-02-28 LAB — RESULTS CONSOLE HPV: CHL HPV: NEGATIVE

## 2022-02-28 LAB — HM PAP SMEAR: HM Pap smear: NORMAL

## 2022-02-28 NOTE — Therapy (Signed)
OUTPATIENT PHYSICAL THERAPY TREATMENT NOTE   Patient Name: Beth Stephens MRN: [id number] DOB:1975/06/14, 46 y.o., female Today's Date: 02/28/2022  PCP: Janie Morning, DO REFERRING PROVIDER: Eppie Gibson, MD  END OF SESSION:   PT End of Session - 02/28/22 0915     Visit Number 19    Number of Visits 27    Date for PT Re-Evaluation 05/23/22    PT Start Time 0921    PT Stop Time 1008    PT Time Calculation (min) 47 min    Activity Tolerance Patient tolerated treatment well    Behavior During Therapy [facility] for tasks assessed/performed                   Past Medical History:  Diagnosis Date   Cancer ([place])    Breast Cancer   Colitis    Colitis    Past Surgical History:  Procedure Laterality Date   BREAST LUMPECTOMY WITH RADIOACTIVE SEED LOCALIZATION Left 05/01/2021   Procedure: LEFT BREAST LUMPECTOMY WITH RADIOACTIVE SEED LOCALIZATION;  Surgeon: Stark Klein, MD;  Location: [facility];  Service: General;  Laterality: Left;   COLONOSCOPY     WISDOM TOOTH EXTRACTION     Patient Active Problem List   Diagnosis Date Noted   Cancer of left breast ([place]) 11/25/2021   Iron deficiency anemia due to chronic blood loss 05/24/2021   Genetic testing 04/22/2021   Ductal carcinoma in situ (DCIS) of left breast 04/08/2021    REFERRING DIAG: DCIS Lt breast  THERAPY DIAG:  Stiffness of left shoulder, not elsewhere classified  Breast edema  Nontraumatic hematoma of soft tissue  Ductal carcinoma in situ (DCIS) of left breast  PERTINENT HISTORY: Left lumpectomy 05/01/21 no SLNB with Dr. Barry Dienes.  Large hematoma formation. Radiation completed  PRECAUTIONS: Other: very small lymphedema risk Lt UE and breast due to radiation  SUBJECTIVE:  I have started my new job 2 weeks ago. Its been very busy.I moved 2 weeks ago and I strained my shoulder a little.  I haven't had much catching but Mild pain. I have done the strength exercses about 1x per week. Doing better sleeping on my left  shoulder if not for 2 long. I can reach a little better without the catch, but reaching behind is still painful. She will be out of the country December 21- January 15.  Are you having pain? yes-  it is  0/10 in shoulder today at rest.. Shoulder can still get a painful pinch with certain motions, but is doing well with most routine activities and intensity is much better overall Pain location: anterior/posterior shoulder Pain description: sharp  Aggravating factors: sleeping on it Relieving factors: rest   OBJECTIVE:  PALPATION: Very hard mandarin orange size hematoma under lateral breast incision, +2 ttp axillary palpation with some puffiness here.  Overall breast not swollen.   12/23/2021 Hematoma is softer with less defined borders, pt is very tender today at lateral lower ribcage   OBSERVATIONS / OTHER ASSESSMENTS: 07/22/21: radiated field still red and healing especially inferior to breast, and peeling blister area in the axilla.        07/02/21: healing hematoma Lt lateral breast, puffiness Lt axilla, wearing soft cotton eye clasp front bra which has no compression but pt reports is comfortable and the only fabric that is okay to wear currently. 11/04/21- hematoma still firm and palpable in left lateral breast, educated pt to try and sleep in her compression bra   UPPER EXTREMITY AROM/PROM:   A/PROM  RIGHT  07/02/2021     Shoulder extension 53   Shoulder flexion 174   Shoulder abduction 165   Shoulder internal rotation Behind the back to T6   Shoulder external rotation 90                           (Blank rows = not tested)   A/PROM LEFT  07/02/2021 07/22/21 08/13/21 08/28/21 11/04/21 12/23/2021 02/28/2022  Shoulder extension 54    66 60 60  Shoulder flexion  145 - ache top of shoulde 145 - ache top of shoulde 155 - with top of the shoulder pinch 155 - no pinch  151 - feels pain coming on 155 pulls at rib 159  Shoulder abduction 160 155 - ache top of the shoulder  155 - ache top of the shoulder   167- no pinch 150 - feels pain coming on 150 with pinch at end range 140 Fear of pinch  Shoulder internal rotation Behind the back to T4     T8 with pinch   Shoulder external rotation 90     94 with pinch at endrange                           (Blank rows = not tested)   TODAY'S TREATMENT   02/28/2022 STM to  left Pectorals, UT,Lt lats in supine and  SL serratus and rhomboids , with cocoa butter  Supine AROM shoulder Flexion, scaption, horizontal abd x 5 Standing scapular retraction and shoulder extension with red theraband x 10, Bilateral ER with yellow x 10. Gave pt new band to use at work Measured shoulder ROM for PN  02/03/2022 STM to  left Pectorals, UT,Lt lats and serratus and rhomboids in supine and Rt S/L, with cocoa butter  Supine wand flexion and scaption x 4 ea Supine AROM shoulder Flexion, scaption, horizontal abd x 5 SL ER x 15 no resistance. Tried 1# wt but was painful, supine alphabet 1# A-Z, tried serratus punches but held secondary to increased achiness Standing scapular retraction and shoulder extension with red band x 10 reps, bilateral ER with yellow x 10. Ball rolls on wall x 10-15 reps up and down, side to side, clockwise and counter clockwise. Pt was very fatigued especially with ball rolls  01/27/2022 STM to  left Pectorals, UT,Lt lats and serratus and rhomboids in supine and Rt S/L, with cocoa butter  Supine AROM shoulder Flexion, scaption, horizontal abd x 5 SL ER with 1 # x 10, alphabet 1 # A-Z, supine punches x 10 0# Supine horizontal abd with yellow x 10 Alternating isometrics and rhythmic stabs 2 x 30 sec 01/06/2022  STM to  left Pectorals, UT,Lt lats and serratus and rhomboids in supine and Rt S/L, with cocoa butter  Supine wand flexion and scaption x 5 Supine ABC  x 1, supine punches x 15, supine chest press x 10 Rhythmic stabs at 90 degrees 2 x 30 sec,  alternating isometrics IR/ER 2 x 30 sec Supine horizontal abduction yellow x 10  12/23/2021  STM to   left Pectorals, UT,Lt lats and serratus and briefly to rhomboids in supine and Rt S/L, with cocoa butter  Supine wand flexion and scaption x , PROM to Lt shoulder into flexion, abduction and D2 flex and ER. Lower trunk rotation with arms outstretched and in goal post position x3, standing left SB stretch x 3 Measured bilateral shoulder ROM  for recert Incision area hematoma feels softer with less defined borders  12/09/2021 STM to  left Pectorals, UT,Lt lats and serratus and briefly to rhomboids in supine and Rt S/L, with cocoa butter  Supine wand flexion and scaption x 3, PROM to Lt shoulder into flexion, abduction and D2 flex and ER MLD: Lt inguinal nodes, Lt axillo-inguinal anastomosis and Lt lateral trunk and breast  where fullness palpable Spaghetti foam pad in stockinette given to pt to place in bra in area of hematoma to try and soften    PATIENT EDUCATION:  Access Code: [email or web address] URL: [email or web address] Date: 01/06/2022 Prepared by: Cheral Almas  Exercises - Supine Shoulder Alphabet  - 1 x daily - 7 x weekly - Supine Single Arm Shoulder Protraction  - 1 x daily - 7 x weekly - 10 reps  Education details: theraband exs SR, IR and ER with yellow Person educated: Patient Education method: Explanation, Demonstration, Tactile cues, Verbal cues, and Handouts Education comprehension: verbalized understanding, returned demonstration, verbal cues required, and needs further education    HOME EXERCISE PROGRAM: Access Code: [email or web address] URL: [email or web address] Date: 08/28/2021 Prepared by: Shan Levans  Exercises - Serratus Forearm Push Up Plus at Wall with Elbows - 1 x daily - 3 x weekly - 1 sets - 10 reps - no hold - Standing Shoulder Row with Anchored Resistance - 1 x daily - 3 x weekly - 1-3 sets - 10 reps - 3sec hold - Shoulder extension with resistance - Neutral - 1 x daily - 3 x weekly - 1-3 sets - 10 reps - 3sec hold - Standing Shoulder External  Rotation with Resistance - 1 x daily - 3 x weekly - 1-3 sets - 10 reps - 3sec hold - Single Arm Doorway Pec Stretch at 90 Degrees Abduction - 1-2 x daily - 7 x weekly - 2 reps - 30-60sec hold  ASSESSMENT:   CLINICAL IMPRESSION: Pt has not been seen in therapy for 4 weeks due to changing jobs and moving.  She has not been able to be consistent with her strengthening exercises, but does note decreased episodes of pinching in the left shoulder, with main difficulty still being abduction and IR behind the back. She is sleeping on the left side for short periods of time without sharp breast pain. Hematoma area continues and remains bothersome for patient. She was reminded to continue massage to the area and foam pack to try and soften it. She will be out of the country for 4 weeks so dates were extended longer to accommodate this. She was given some theraband that she can take to work to get in some strengthening exercises. Pt continues to need reminding about good posture and its importance with shoulder pain. She will benefit from skilled therapy to  progress strength and left UE function. OBJECTIVE IMPAIRMENTS decreased knowledge of use of DME, decreased ROM, and increased edema.    ACTIVITY LIMITATIONS community activity and yard work.    PERSONAL FACTORS 1 comorbidity: hematoma complication  are also affecting patient's functional outcome.      REHAB POTENTIAL: Excellent   CLINICAL DECISION MAKING: Stable/uncomplicated   EVALUATION COMPLEXITY: Low  GOALS: Goals reviewed with patient? Yes     LONG TERM GOALS: Target date: 05/23/2022   Pt will improve Lt UE AROM to equal to the Rt UE and without pain Baseline:  Goal status: ONGOING   2.  Pt will be ind with self care regarding Lt breast hematoma including self massage, compression bra, and  use of foam or swell spot Baseline:  Goal status: ONGOING - pt reports it has softened but it is still there  3. Pt will not demonstrate shooting  breast  pains to allow improved comfort.  Baseline:  Goals status: MET if not sleeping on left side 4. Pt will be independent in HEP for left shoulder ROM and in particular strengthening             GOAL STATUS : progressing PLAN: PT FREQUENCY: 1x/week   PT DURATION: 12 weeks    PLANNED INTERVENTIONS: Therapeutic exercises, Patient/Family education, Joint mobilization, DME instructions, Manual lymph drainage, Compression bandaging, scar mobilization, Taping, and Manual therapy, iontophoresis with dexamethasone*   PLAN FOR NEXT SESSION:  ,STM, scapular and GH mob, working on increase AROM and decreasing shoulder pain with overhead reach; PROM,MLD lateral trunk prn, review theraband exs. and progress strength to prevent posterior impingement, MLD lateral trunk/hematoma area prn.       Claris Pong, PT 02/28/2022, 10:18 AM

## 2022-03-04 ENCOUNTER — Encounter: Payer: Self-pay | Admitting: Hematology

## 2022-03-07 ENCOUNTER — Ambulatory Visit: Payer: 59

## 2022-03-07 DIAGNOSIS — M25612 Stiffness of left shoulder, not elsewhere classified: Secondary | ICD-10-CM | POA: Diagnosis not present

## 2022-03-07 DIAGNOSIS — D0512 Intraductal carcinoma in situ of left breast: Secondary | ICD-10-CM

## 2022-03-07 DIAGNOSIS — N6489 Other specified disorders of breast: Secondary | ICD-10-CM

## 2022-03-07 DIAGNOSIS — M7981 Nontraumatic hematoma of soft tissue: Secondary | ICD-10-CM

## 2022-03-07 NOTE — Therapy (Signed)
OUTPATIENT PHYSICAL THERAPY TREATMENT NOTE   Patient Name: Beth Stephens MRN: [id number] DOB:Feb 19, 1976, 46 y.o., female Today's Date: 03/07/2022  PCP: Janie Morning, DO REFERRING PROVIDER: Eppie Gibson, MD  END OF SESSION:   PT End of Session - 03/07/22 0902     Visit Number 20    Number of Visits 27    Date for PT Re-Evaluation 05/23/22    PT Start Time 0903    PT Stop Time 0956    PT Time Calculation (min) 53 min    Activity Tolerance Patient tolerated treatment well    Behavior During Therapy [facility] for tasks assessed/performed                   Past Medical History:  Diagnosis Date   Cancer ([place])    Breast Cancer   Colitis    Colitis    Past Surgical History:  Procedure Laterality Date   BREAST LUMPECTOMY WITH RADIOACTIVE SEED LOCALIZATION Left 05/01/2021   Procedure: LEFT BREAST LUMPECTOMY WITH RADIOACTIVE SEED LOCALIZATION;  Surgeon: Stark Klein, MD;  Location: [facility];  Service: General;  Laterality: Left;   COLONOSCOPY     WISDOM TOOTH EXTRACTION     Patient Active Problem List   Diagnosis Date Noted   Cancer of left breast ([place]) 11/25/2021   Iron deficiency anemia due to chronic blood loss 05/24/2021   Genetic testing 04/22/2021   Ductal carcinoma in situ (DCIS) of left breast 04/08/2021    REFERRING DIAG: DCIS Lt breast  THERAPY DIAG:  Stiffness of left shoulder, not elsewhere classified  Breast edema  Nontraumatic hematoma of soft tissue  Ductal carcinoma in situ (DCIS) of left breast  PERTINENT HISTORY: Left lumpectomy 05/01/21 no SLNB with Dr. Barry Dienes.  Large hematoma formation. Radiation completed  PRECAUTIONS: Other: very small lymphedema risk Lt UE and breast due to radiation  SUBJECTIVE: I was leaning on a door at work and the door swung and it pushed my arm behind me and I had a big pinch.  It resolved in about 5 min, but then it was achy. I did try the band at work too. I did some 10 # overhead presses but my shoulder got  achy.  .  Are you having pain? yes-  it is  0/10 in shoulder today at rest.. Shoulder can still get a painful pinch with certain motions, but is doing well with most routine activities and intensity is much better overall Pain location: anterior/posterior shoulder Pain description: sharp  Aggravating factors: sleeping on it Relieving factors: rest   OBJECTIVE:  PALPATION: Very hard mandarin orange size hematoma under lateral breast incision, +2 ttp axillary palpation with some puffiness here.  Overall breast not swollen.   12/23/2021 Hematoma is softer with less defined borders, pt is very tender today at lateral lower ribcage   OBSERVATIONS / OTHER ASSESSMENTS: 07/22/21: radiated field still red and healing especially inferior to breast, and peeling blister area in the axilla.        07/02/21: healing hematoma Lt lateral breast, puffiness Lt axilla, wearing soft cotton eye clasp front bra which has no compression but pt reports is comfortable and the only fabric that is okay to wear currently. 11/04/21- hematoma still firm and palpable in left lateral breast, educated pt to try and sleep in her compression bra   UPPER EXTREMITY AROM/PROM:   A/PROM RIGHT  07/02/2021     Shoulder extension 53   Shoulder flexion 174   Shoulder abduction 165   Shoulder  internal rotation Behind the back to T6   Shoulder external rotation 90                           (Blank rows = not tested)   A/PROM LEFT  07/02/2021 07/22/21 08/13/21 08/28/21 11/04/21 12/23/2021 02/28/2022  Shoulder extension 54    66 60 60  Shoulder flexion  145 - ache top of shoulde 145 - ache top of shoulde 155 - with top of the shoulder pinch 155 - no pinch  151 - feels pain coming on 155 pulls at rib 159  Shoulder abduction 160 155 - ache top of the shoulder  155 - ache top of the shoulder  167- no pinch 150 - feels pain coming on 150 with pinch at end range 140 Fear of pinch  Shoulder internal rotation Behind the back to T4     T8 with pinch    Shoulder external rotation 90     94 with pinch at endrange                           (Blank rows = not tested)   TODAY'S TREATMENT   02/28/2022 STM to  left Pectorals, UT,Lt lats in supine and  SL serratus and rhomboids , with cocoa butter  Supine AROM shoulder Flexion, scaption, horizontal abd x 5 Alphabet with 1 # x 1, SL ER 0# x 15 Standing scapular retraction and shoulder extension with red theraband x 10, Bilateral ER with yellow x 10, red x 7 Biceps curls 1 #x 10, 2 # x 10, 3# x4 held due to post arm discomfort 4 D ball rolls on wall x 10 Emphasis on proper posture and form with VC's prn Discussed holding overhead presses and starting with light wt for biceps curls ( ie 2 # using good form/posture and progressing band for ER to 15 or 20 reps with yellow before moving to red. Pts left arm fatigues very quickly.   02/28/2022  STM to  left Pectorals, UT,Lt lats in supine and  SL serratus and rhomboids , with cocoa butter  Supine AROM shoulder Flexion, scaption, horizontal abd x 5 Standing scapular retraction and shoulder extension with red theraband x 10, Bilateral ER with yellow x 10. Gave pt new band to use at work Measured shoulder ROM for PN   02/03/2022 STM to  left Pectorals, UT,Lt lats and serratus and rhomboids in supine and Rt S/L, with cocoa butter  Supine wand flexion and scaption x 4 ea Supine AROM shoulder Flexion, scaption, horizontal abd x 5 SL ER x 15 no resistance. Tried 1# wt but was painful, supine alphabet 1# A-Z, tried serratus punches but held secondary to increased achiness Standing scapular retraction and shoulder extension with red band x 10 reps, bilateral ER with yellow x 10. Ball rolls on wall x 10-15 reps up and down, side to side, clockwise and counter clockwise. Pt was very fatigued especially with ball rolls  01/27/2022 STM to  left Pectorals, UT,Lt lats and serratus and rhomboids in supine and Rt S/L, with cocoa butter  Supine AROM shoulder  Flexion, scaption, horizontal abd x 5 SL ER with 1 # x 10, alphabet 1 # A-Z, supine punches x 10 0# Supine horizontal abd with yellow x 10 Alternating isometrics and rhythmic stabs 2 x 30 sec 01/06/2022  STM to  left Pectorals, UT,Lt lats and serratus and rhomboids in supine  and Rt S/L, with cocoa butter  Supine wand flexion and scaption x 5 Supine ABC  x 1, supine punches x 15, supine chest press x 10 Rhythmic stabs at 90 degrees 2 x 30 sec,  alternating isometrics IR/ER 2 x 30 sec Supine horizontal abduction yellow x 10  12/23/2021  STM to  left Pectorals, UT,Lt lats and serratus and briefly to rhomboids in supine and Rt S/L, with cocoa butter  Supine wand flexion and scaption x , PROM to Lt shoulder into flexion, abduction and D2 flex and ER. Lower trunk rotation with arms outstretched and in goal post position x3, standing left SB stretch x 3 Measured bilateral shoulder ROM for recert Incision area hematoma feels softer with less defined borders  12/09/2021 STM to  left Pectorals, UT,Lt lats and serratus and briefly to rhomboids in supine and Rt S/L, with cocoa butter  Supine wand flexion and scaption x 3, PROM to Lt shoulder into flexion, abduction and D2 flex and ER MLD: Lt inguinal nodes, Lt axillo-inguinal anastomosis and Lt lateral trunk and breast  where fullness palpable Spaghetti foam pad in stockinette given to pt to place in bra in area of hematoma to try and soften    PATIENT EDUCATION:  Access Code: [email or web address] URL: [email or web address] Date: 01/06/2022 Prepared by: Cheral Almas  Exercises - Supine Shoulder Alphabet  - 1 x daily - 7 x weekly - Supine Single Arm Shoulder Protraction  - 1 x daily - 7 x weekly - 10 reps  Education details: theraband exs SR, IR and ER with yellow Person educated: Patient Education method: Explanation, Demonstration, Tactile cues, Verbal cues, and Handouts Education comprehension: verbalized understanding, returned  demonstration, verbal cues required, and needs further education    HOME EXERCISE PROGRAM: Access Code: [email or web address] URL: [email or web address] Date: 08/28/2021 Prepared by: Shan Levans  Exercises - Serratus Forearm Push Up Plus at Wall with Elbows - 1 x daily - 3 x weekly - 1 sets - 10 reps - no hold - Standing Shoulder Row with Anchored Resistance - 1 x daily - 3 x weekly - 1-3 sets - 10 reps - 3sec hold - Shoulder extension with resistance - Neutral - 1 x daily - 3 x weekly - 1-3 sets - 10 reps - 3sec hold - Standing Shoulder External Rotation with Resistance - 1 x daily - 3 x weekly - 1-3 sets - 10 reps - 3sec hold - Single Arm Doorway Pec Stretch at 90 Degrees Abduction - 1-2 x daily - 7 x weekly - 2 reps - 30-60sec hold  ASSESSMENT:   CLINICAL IMPRESSION:  Pt had an episode at work where a door pulled her arm back and caused a bad pinch. It resolved in about 5 mins. She continues with complaints of pinching pain which has improved significantly but does still bother her. It was suggested again that she see an orthopedic MD. Left arm fatigues very quickly with exercises using very light wts.   OBJECTIVE IMPAIRMENTS decreased knowledge of use of DME, decreased ROM, and increased edema.    ACTIVITY LIMITATIONS community activity and yard work.    PERSONAL FACTORS 1 comorbidity: hematoma complication  are also affecting patient's functional outcome.      REHAB POTENTIAL: Excellent   CLINICAL DECISION MAKING: Stable/uncomplicated   EVALUATION COMPLEXITY: Low  GOALS: Goals reviewed with patient? Yes     LONG TERM GOALS: Target date: 05/23/2022   Pt will improve Lt UE AROM to equal to the Rt UE and without pain  Baseline:  Goal status: ONGOING   2.  Pt will be ind with self care regarding Lt breast hematoma including self massage, compression bra, and use of foam or swell spot Baseline:  Goal status: ONGOING - pt reports it has softened but it is still there  3.  Pt will not demonstrate shooting breast  pains to allow improved comfort.  Baseline:  Goals status: MET if not sleeping on left side 4. Pt will be independent in HEP for left shoulder ROM and in particular strengthening             GOAL STATUS : progressing PLAN: PT FREQUENCY: 1x/week   PT DURATION: 12 weeks    PLANNED INTERVENTIONS: Therapeutic exercises, Patient/Family education, Joint mobilization, DME instructions, Manual lymph drainage, Compression bandaging, scar mobilization, Taping, and Manual therapy, iontophoresis with dexamethasone*   PLAN FOR NEXT SESSION:  ,STM, scapular and GH mob, working on increase AROM and decreasing shoulder pain with overhead reach; PROM,MLD lateral trunk prn, review theraband exs. and progress strength to prevent posterior impingement, MLD lateral trunk/hematoma area prn., ABC exercises?       Claris Pong, PT 03/07/2022, 10:16 AM

## 2022-03-14 ENCOUNTER — Ambulatory Visit: Payer: 59

## 2022-03-14 DIAGNOSIS — N6489 Other specified disorders of breast: Secondary | ICD-10-CM

## 2022-03-14 DIAGNOSIS — D0512 Intraductal carcinoma in situ of left breast: Secondary | ICD-10-CM

## 2022-03-14 DIAGNOSIS — M25612 Stiffness of left shoulder, not elsewhere classified: Secondary | ICD-10-CM

## 2022-03-14 DIAGNOSIS — M7981 Nontraumatic hematoma of soft tissue: Secondary | ICD-10-CM

## 2022-03-14 NOTE — Therapy (Signed)
OUTPATIENT PHYSICAL THERAPY TREATMENT NOTE   Patient Name: Beth Stephens MRN: [id number] DOB:1975/06/30, 46 y.o., female Today's Date: 03/14/2022  PCP: Janie Morning, DO REFERRING PROVIDER: Eppie Gibson, MD  END OF SESSION:   PT End of Session - 03/14/22 0901     Visit Number 21    Number of Visits 27    Date for PT Re-Evaluation 05/23/22    PT Start Time 0902    PT Stop Time 0958    PT Time Calculation (min) 56 min    Activity Tolerance Patient tolerated treatment well    Behavior During Therapy [facility] for tasks assessed/performed                   Past Medical History:  Diagnosis Date   Cancer ([place])    Breast Cancer   Colitis    Colitis    Past Surgical History:  Procedure Laterality Date   BREAST LUMPECTOMY WITH RADIOACTIVE SEED LOCALIZATION Left 05/01/2021   Procedure: LEFT BREAST LUMPECTOMY WITH RADIOACTIVE SEED LOCALIZATION;  Surgeon: Stark Klein, MD;  Location: [place];  Service: General;  Laterality: Left;   COLONOSCOPY     WISDOM TOOTH EXTRACTION     Patient Active Problem List   Diagnosis Date Noted   Cancer of left breast ([place]) 11/25/2021   Iron deficiency anemia due to chronic blood loss 05/24/2021   Genetic testing 04/22/2021   Ductal carcinoma in situ (DCIS) of left breast 04/08/2021    REFERRING DIAG: DCIS Lt breast  THERAPY DIAG:  Stiffness of left shoulder, not elsewhere classified  Breast edema  Nontraumatic hematoma of soft tissue  Ductal carcinoma in situ (DCIS) of left breast  PERTINENT HISTORY: Left lumpectomy 05/01/21 no SLNB with Dr. Barry Dienes.  Large hematoma formation. Radiation completed  PRECAUTIONS: Other: very small lymphedema risk Lt UE and breast due to radiation  SUBJECTIVE: . Overnight I was stretching and reached suddenly and had the pinch and shooting pain from the triceps up to the shoulder. It lasted for about 10 min. .  Are you having pain? yes-  it is  0/10 in shoulder today at rest.. Shoulder can still get  a painful pinch with certain motions, but is doing well with most routine activities and intensity is much better overall Pain location: anterior/posterior shoulder Pain description: sharp  Aggravating factors: sleeping on it Relieving factors: rest   OBJECTIVE:  PALPATION: Very hard mandarin orange size hematoma under lateral breast incision, +2 ttp axillary palpation with some puffiness here.  Overall breast not swollen.   12/23/2021 Hematoma is softer with less defined borders, pt is very tender today at lateral lower ribcage   OBSERVATIONS / OTHER ASSESSMENTS: 07/22/21: radiated field still red and healing especially inferior to breast, and peeling blister area in the axilla.        07/02/21: healing hematoma Lt lateral breast, puffiness Lt axilla, wearing soft cotton eye clasp front bra which has no compression but pt reports is comfortable and the only fabric that is okay to wear currently. 11/04/21- hematoma still firm and palpable in left lateral breast, educated pt to try and sleep in her compression bra   UPPER EXTREMITY AROM/PROM:   A/PROM RIGHT  07/02/2021     Shoulder extension 53   Shoulder flexion 174   Shoulder abduction 165   Shoulder internal rotation Behind the back to T6   Shoulder external rotation 90                           (  Blank rows = not tested)   A/PROM LEFT  07/02/2021 07/22/21 08/13/21 08/28/21 11/04/21 12/23/2021 02/28/2022  Shoulder extension 54    66 60 60  Shoulder flexion  145 - ache top of shoulde 145 - ache top of shoulde 155 - with top of the shoulder pinch 155 - no pinch  151 - feels pain coming on 155 pulls at rib 159  Shoulder abduction 160 155 - ache top of the shoulder  155 - ache top of the shoulder  167- no pinch 150 - feels pain coming on 150 with pinch at end range 140 Fear of pinch  Shoulder internal rotation Behind the back to T4     T8 with pinch   Shoulder external rotation 90     94 with pinch at endrange                           (Blank rows = not  tested)   TODAY'S TREATMENT   03/14/2022  STM to  left Pectorals, UT,Lt lats in supine and  SL serratus, infraspinatus and rhomboids  and triceps, with cocoa butter  and cupping to infraspinatus and scapular area Supine AROM shoulder Flexion, scaption, horizontal abd x 5 Supine wand flexion and scaption x 5 ea Alphabet with 2 # x 1, SL ER 0# x 15 Tried standing biceps curls with 1# but was uncomfortable in posterior shoulder so held.further exercises Discussed use of gentle ice to posterior shoulder 1-2x/day. She may also try a Salonpas. Also discussed seeing an orthopedist secondary to not continuing to improve  02/28/2022 STM to  left Pectorals, UT,Lt lats in supine and  SL serratus and rhomboids , with cocoa butter  Supine AROM shoulder Flexion, scaption, horizontal abd x 5 Alphabet with 1 # x 1, SL ER 0# x 15 Standing scapular retraction and shoulder extension with red theraband x 10, Bilateral ER with yellow x 10, red x 7 Biceps curls 1 #x 10, 2 # x 10, 3# x4 held due to post arm discomfort 4 D ball rolls on wall x 10 Emphasis on proper posture and form with VC's prn Discussed holding overhead presses and starting with light wt for biceps curls ( ie 2 # using good form/posture and progressing band for ER to 15 or 20 reps with yellow before moving to red. Pts left arm fatigues very quickly.   02/28/2022  STM to  left Pectorals, UT,Lt lats in supine and  SL serratus and rhomboids , with cocoa butter  Supine AROM shoulder Flexion, scaption, horizontal abd x 5 Standing scapular retraction and shoulder extension with red theraband x 10, Bilateral ER with yellow x 10. Gave pt new band to use at work Measured shoulder ROM for PN   02/03/2022 STM to  left Pectorals, UT,Lt lats and serratus and rhomboids in supine and Rt S/L, with cocoa butter  Supine wand flexion and scaption x 4 ea Supine AROM shoulder Flexion, scaption, horizontal abd x 5 SL ER x 15 no resistance. Tried 1# wt but was  painful, supine alphabet 1# A-Z, tried serratus punches but held secondary to increased achiness Standing scapular retraction and shoulder extension with red band x 10 reps, bilateral ER with yellow x 10. Ball rolls on wall x 10-15 reps up and down, side to side, clockwise and counter clockwise. Pt was very fatigued especially with ball rolls  01/27/2022 STM to  left Pectorals, UT,Lt lats and serratus and rhomboids in supine  and Rt S/L, with cocoa butter  Supine AROM shoulder Flexion, scaption, horizontal abd x 5 SL ER with 1 # x 10, alphabet 1 # A-Z, supine punches x 10 0# Supine horizontal abd with yellow x 10 Alternating isometrics and rhythmic stabs 2 x 30 sec  PATIENT EDUCATION:  Access Code: [email or web address] URL: [email or web address] Date: 01/06/2022 Prepared by: Robin Walcott  Exercises - Supine Shoulder Alphabet  - 1 x daily - 7 x weekly - Supine Single Arm Shoulder Protraction  - 1 x daily - 7 x weekly - 10 reps  Education details: theraband exs SR, IR and ER with yellow Person educated: Patient Education method: Explanation, Demonstration, Tactile cues, Verbal cues, and Handouts Education comprehension: verbalized understanding, returned demonstration, verbal cues required, and needs further education    HOME EXERCISE PROGRAM: Access Code: [email or web address] URL: [email or web address] Date: 08/28/2021 Prepared by: Kara Tevis  Exercises - Serratus Forearm Push Up Plus at Wall with Elbows - 1 x daily - 3 x weekly - 1 sets - 10 reps - no hold - Standing Shoulder Row with Anchored Resistance - 1 x daily - 3 x weekly - 1-3 sets - 10 reps - 3sec hold - Shoulder extension with resistance - Neutral - 1 x daily - 3 x weekly - 1-3 sets - 10 reps - 3sec hold - Standing Shoulder External Rotation with Resistance - 1 x daily - 3 x weekly - 1-3 sets - 10 reps - 3sec hold - Single Arm Doorway Pec Stretch at 90 Degrees Abduction - 1-2 x daily - 7 x weekly - 2 reps - 30-60sec  hold  ASSESSMENT:   CLINICAL IMPRESSION: Continued soft tissue mobilization and added several minutes of cupping to infraspinatus and scapular area. Pt had significantly increased tissue tension in left infraspinatus with manual work today after having increased pain there with stretching early this am while in bed. With wand exercises she experienced pain in the same region which improved with scapular depression. Held further exercises today secondary to discomfort and suggested gentle ice to posterior shoulder1-2x per day for 10 min. Also discussed seeing an orthopedist since she is not continuing to improve.  OBJECTIVE IMPAIRMENTS decreased knowledge of use of DME, decreased ROM, and increased edema.    ACTIVITY LIMITATIONS community activity and yard work.    PERSONAL FACTORS 1 comorbidity: hematoma complication  are also affecting patient's functional outcome.      REHAB POTENTIAL: Excellent   CLINICAL DECISION MAKING: Stable/uncomplicated   EVALUATION COMPLEXITY: Low  GOALS: Goals reviewed with patient? Yes     LONG TERM GOALS: Target date: 05/23/2022   Pt will improve Lt UE AROM to equal to the Rt UE and without pain Baseline:  Goal status: ONGOING   2.  Pt will be ind with self care regarding Lt breast hematoma including self massage, compression bra, and use of foam or swell spot Baseline:  Goal status: ONGOING - pt reports it has softened but it is still there  3. Pt will not demonstrate shooting breast  pains to allow improved comfort.  Baseline:  Goals status: MET if not sleeping on left side 4. Pt will be independent in HEP for left shoulder ROM and in particular strengthening             GOAL STATUS : progressing PLAN: PT FREQUENCY: 1x/week   PT DURATION: 12 weeks    PLANNED INTERVENTIONS: Therapeutic exercises, Patient/Family education, Joint mobilization, DME instructions, Manual lymph drainage, Compression bandaging, scar mobilization, Taping, and Manual    therapy, iontophoresis with dexamethasone*   PLAN FOR NEXT SESSION:  ,STM, scapular and GH mob, working on increase AROM and decreasing shoulder pain with overhead reach; PROM,MLD lateral trunk prn, review theraband exs. and progress strength to prevent posterior impingement, MLD lateral trunk/hematoma area prn., ABC exercises?       Robin J Walcott, PT 03/14/2022, 10:16 AM    

## 2022-03-21 ENCOUNTER — Ambulatory Visit: Payer: 59 | Admitting: Physical Therapy

## 2022-03-29 LAB — RESULTS CONSOLE HPV: CHL HPV: NEGATIVE

## 2022-03-29 LAB — HM PAP SMEAR: HM Pap smear: NORMAL

## 2022-04-18 ENCOUNTER — Ambulatory Visit: Payer: 59 | Attending: Radiation Oncology

## 2022-04-18 DIAGNOSIS — N6489 Other specified disorders of breast: Secondary | ICD-10-CM | POA: Diagnosis present

## 2022-04-18 DIAGNOSIS — M7981 Nontraumatic hematoma of soft tissue: Secondary | ICD-10-CM | POA: Diagnosis present

## 2022-04-18 DIAGNOSIS — M25612 Stiffness of left shoulder, not elsewhere classified: Secondary | ICD-10-CM | POA: Insufficient documentation

## 2022-04-18 DIAGNOSIS — D0512 Intraductal carcinoma in situ of left breast: Secondary | ICD-10-CM | POA: Diagnosis present

## 2022-04-18 NOTE — Therapy (Signed)
OUTPATIENT PHYSICAL THERAPY TREATMENT NOTE   Patient Name: Beth Stephens MRN: [id number] DOB:12-05-1975, 47 y.o., female Today's Date: 04/18/2022  PCP: Janie Morning, DO REFERRING PROVIDER: Eppie Gibson, MD  END OF SESSION:   PT End of Session - 04/18/22 0917     Visit Number 22    Number of Visits 27    Date for PT Re-Evaluation 05/23/22    PT Start Time 0920   pt late   PT Stop Time 1000    PT Time Calculation (min) 40 min    Activity Tolerance Patient tolerated treatment well    Behavior During Therapy [facility] for tasks assessed/performed                   Past Medical History:  Diagnosis Date   Cancer ([place])    Breast Cancer   Colitis    Colitis    Past Surgical History:  Procedure Laterality Date   BREAST LUMPECTOMY WITH RADIOACTIVE SEED LOCALIZATION Left 05/01/2021   Procedure: LEFT BREAST LUMPECTOMY WITH RADIOACTIVE SEED LOCALIZATION;  Surgeon: Stark Klein, MD;  Location: [place];  Service: General;  Laterality: Left;   COLONOSCOPY     WISDOM TOOTH EXTRACTION     Patient Active Problem List   Diagnosis Date Noted   Cancer of left breast ([place]) 11/25/2021   Iron deficiency anemia due to chronic blood loss 05/24/2021   Genetic testing 04/22/2021   Ductal carcinoma in situ (DCIS) of left breast 04/08/2021    REFERRING DIAG: DCIS Lt breast  THERAPY DIAG:  Stiffness of left shoulder, not elsewhere classified  Breast edema  Nontraumatic hematoma of soft tissue  Ductal carcinoma in situ (DCIS) of left breast  PERTINENT HISTORY: Left lumpectomy 05/01/21 no SLNB with Dr. Barry Dienes.  Large hematoma formation. Radiation completed  PRECAUTIONS: Other: very small lymphedema risk Lt UE and breast due to radiation  SUBJECTIVE: Pt had increased pain in her left shoulder after travelling in [place] and having to don tight clothing over her head. .  Are you having pain? yes-  it is  0/10 in shoulder today at rest.. Pain location: anterior/posterior shoulder Pain  description: sharp  Aggravating factors: sleeping on it Relieving factors: rest   OBJECTIVE:  PALPATION: Very hard mandarin orange size hematoma under lateral breast incision, +2 ttp axillary palpation with some puffiness here.  Overall breast not swollen.   12/23/2021 Hematoma is softer with less defined borders, pt is very tender today at lateral lower ribcage   OBSERVATIONS / OTHER ASSESSMENTS: 07/22/21: radiated field still red and healing especially inferior to breast, and peeling blister area in the axilla.        07/02/21: healing hematoma Lt lateral breast, puffiness Lt axilla, wearing soft cotton eye clasp front bra which has no compression but pt reports is comfortable and the only fabric that is okay to wear currently. 11/04/21- hematoma still firm and palpable in left lateral breast, educated pt to try and sleep in her compression bra   UPPER EXTREMITY AROM/PROM:   A/PROM RIGHT  07/02/2021     Shoulder extension 53   Shoulder flexion 174   Shoulder abduction 165   Shoulder internal rotation Behind the back to T6   Shoulder external rotation 90                           (Blank rows = not tested)   A/PROM LEFT  07/02/2021 07/22/21 08/13/21 08/28/21 11/04/21 12/23/2021 02/28/2022  Shoulder  extension 54    66 60 60  Shoulder flexion  145 - ache top of shoulde 145 - ache top of shoulde 155 - with top of the shoulder pinch 155 - no pinch  151 - feels pain coming on 155 pulls at rib 159  Shoulder abduction 160 155 - ache top of the shoulder  155 - ache top of the shoulder  167- no pinch 150 - feels pain coming on 150 with pinch at end range 140 Fear of pinch  Shoulder internal rotation Behind the back to T4     T8 with pinch   Shoulder external rotation 90     94 with pinch at endrange                           (Blank rows = not tested)   TODAY'S TREATMENT   04/17/2021  STM to  left Pectorals, UT,Lt lats in supine and  SL serratus, infraspinatus and rhomboids   with cocoa butter  and cupping  to infraspinatus and scapular area Attempted PROM to left shoulder but pt was guarded and getting discomfort so stopped. Serratus punches x 10  03/14/2022  STM to  left Pectorals, UT,Lt lats in supine and  SL serratus, infraspinatus and rhomboids  and triceps, with cocoa butter  and cupping to infraspinatus and scapular area Supine AROM shoulder Flexion, scaption, horizontal abd x 5 Supine wand flexion and scaption x 5 ea Alphabet with 2 # x 1, SL ER 0# x 15 Tried standing biceps curls with 1# but was uncomfortable in posterior shoulder so held.further exercises Discussed use of gentle ice to posterior shoulder 1-2x/day. She may also try a Salonpas. Also discussed seeing an orthopedist secondary to not continuing to improve  02/28/2022 STM to  left Pectorals, UT,Lt lats in supine and  SL serratus and rhomboids , with cocoa butter  Supine AROM shoulder Flexion, scaption, horizontal abd x 5 Alphabet with 1 # x 1, SL ER 0# x 15 Standing scapular retraction and shoulder extension with red theraband x 10, Bilateral ER with yellow x 10, red x 7 Biceps curls 1 #x 10, 2 # x 10, 3# x4 held due to post arm discomfort 4 D ball rolls on wall x 10 Emphasis on proper posture and form with VC's prn Discussed holding overhead presses and starting with light wt for biceps curls ( ie 2 # using good form/posture and progressing band for ER to 15 or 20 reps with yellow before moving to red. Pts left arm fatigues very quickly.   02/28/2022  STM to  left Pectorals, UT,Lt lats in supine and  SL serratus and rhomboids , with cocoa butter  Supine AROM shoulder Flexion, scaption, horizontal abd x 5 Standing scapular retraction and shoulder extension with red theraband x 10, Bilateral ER with yellow x 10. Gave pt new band to use at work Measured shoulder ROM for PN   02/03/2022 STM to  left Pectorals, UT,Lt lats and serratus and rhomboids in supine and Rt S/L, with cocoa butter  Supine wand flexion and scaption  x 4 ea Supine AROM shoulder Flexion, scaption, horizontal abd x 5 SL ER x 15 no resistance. Tried 1# wt but was painful, supine alphabet 1# A-Z, tried serratus punches but held secondary to increased achiness Standing scapular retraction and shoulder extension with red band x 10 reps, bilateral ER with yellow x 10. Ball rolls on wall x 10-15 reps up and  down, side to side, clockwise and counter clockwise. Pt was very fatigued especially with ball rolls  01/27/2022 STM to  left Pectorals, UT,Lt lats and serratus and rhomboids in supine and Rt S/L, with cocoa butter  Supine AROM shoulder Flexion, scaption, horizontal abd x 5 SL ER with 1 # x 10, alphabet 1 # A-Z, supine punches x 10 0# Supine horizontal abd with yellow x 10 Alternating isometrics and rhythmic stabs 2 x 30 sec  PATIENT EDUCATION:  Access Code: [email or web address] URL: [email or web address] Date: 01/06/2022 Prepared by: Cheral Almas  Exercises - Supine Shoulder Alphabet  - 1 x daily - 7 x weekly - Supine Single Arm Shoulder Protraction  - 1 x daily - 7 x weekly - 10 reps  Education details: theraband exs SR, IR and ER with yellow Person educated: Patient Education method: Explanation, Demonstration, Tactile cues, Verbal cues, and Handouts Education comprehension: verbalized understanding, returned demonstration, verbal cues required, and needs further education    HOME EXERCISE PROGRAM: Access Code: [email or web address] URL: [email or web address] Date: 08/28/2021 Prepared by: Shan Levans  Exercises - Serratus Forearm Push Up Plus at Wall with Elbows - 1 x daily - 3 x weekly - 1 sets - 10 reps - no hold - Standing Shoulder Row with Anchored Resistance - 1 x daily - 3 x weekly - 1-3 sets - 10 reps - 3sec hold - Shoulder extension with resistance - Neutral - 1 x daily - 3 x weekly - 1-3 sets - 10 reps - 3sec hold - Standing Shoulder External Rotation with Resistance - 1 x daily - 3 x weekly - 1-3 sets - 10 reps -  3sec hold - Single Arm Doorway Pec Stretch at 90 Degrees Abduction - 1-2 x daily - 7 x weekly - 2 reps - 30-60sec hold  ASSESSMENT:   CLINICAL IMPRESSION: Pt had increased pain in the left shoulder after travelling to [place] and used her voltaren gel Continued soft tissue mobilization and added cupping to infraspinatus and scapular area. She has tightness in left UT, serratus and especially left infraspinatus with a trigger point that referred to the lateral upper arm.   OBJECTIVE IMPAIRMENTS decreased knowledge of use of DME, decreased ROM, and increased edema.    ACTIVITY LIMITATIONS community activity and yard work.    PERSONAL FACTORS 1 comorbidity: hematoma complication  are also affecting patient's functional outcome.      REHAB POTENTIAL: Excellent   CLINICAL DECISION MAKING: Stable/uncomplicated   EVALUATION COMPLEXITY: Low  GOALS: Goals reviewed with patient? Yes     LONG TERM GOALS: Target date: 05/23/2022   Pt will improve Lt UE AROM to equal to the Rt UE and without pain Baseline:  Goal status: ONGOING   2.  Pt will be ind with self care regarding Lt breast hematoma including self massage, compression bra, and use of foam or swell spot Baseline:  Goal status: ONGOING - pt reports it has softened but it is still there  3. Pt will not demonstrate shooting breast  pains to allow improved comfort.  Baseline:  Goals status: MET if not sleeping on left side 4. Pt will be independent in HEP for left shoulder ROM and in particular strengthening             GOAL STATUS : progressing PLAN: PT FREQUENCY: 1x/week   PT DURATION: 12 weeks    PLANNED INTERVENTIONS: Therapeutic exercises, Patient/Family education, Joint mobilization, DME instructions, Manual lymph drainage, Compression bandaging, scar mobilization, Taping, and Manual therapy, iontophoresis with dexamethasone*  PLAN FOR NEXT SESSION:  ,STM, scapular and GH mob, working on increase AROM and decreasing  shoulder pain with overhead reach; PROM,MLD lateral trunk prn, review theraband exs. and progress strength to prevent posterior impingement, MLD lateral trunk/hematoma area prn., ABC exercises?       Claris Pong, PT 04/18/2022, 10:58 AM

## 2022-04-25 ENCOUNTER — Ambulatory Visit: Payer: 59

## 2022-04-25 DIAGNOSIS — M25612 Stiffness of left shoulder, not elsewhere classified: Secondary | ICD-10-CM

## 2022-04-25 DIAGNOSIS — D0512 Intraductal carcinoma in situ of left breast: Secondary | ICD-10-CM

## 2022-04-25 DIAGNOSIS — M7981 Nontraumatic hematoma of soft tissue: Secondary | ICD-10-CM

## 2022-04-25 DIAGNOSIS — N6489 Other specified disorders of breast: Secondary | ICD-10-CM

## 2022-04-25 NOTE — Therapy (Signed)
OUTPATIENT PHYSICAL THERAPY TREATMENT NOTE   Patient Name: Beth Stephens MRN: [id number] DOB:[birth year], 47 y.o., female Today's Date: 04/25/2022  PCP: Janie Morning, DO REFERRING PROVIDER: Eppie Gibson, MD  END OF SESSION:   PT End of Session - 04/25/22 0907     Visit Number 23    Number of Visits 27    Date for PT Re-Evaluation 05/23/22    PT Start Time 0908    PT Stop Time 0956    PT Time Calculation (min) 48 min    Activity Tolerance Patient tolerated treatment well    Behavior During Therapy [facility] for tasks assessed/performed                   Past Medical History:  Diagnosis Date   Cancer ([place])    Breast Cancer   Colitis    Colitis    Past Surgical History:  Procedure Laterality Date   BREAST LUMPECTOMY WITH RADIOACTIVE SEED LOCALIZATION Left 05/01/2021   Procedure: LEFT BREAST LUMPECTOMY WITH RADIOACTIVE SEED LOCALIZATION;  Surgeon: Stark Klein, MD;  Location: [facility];  Service: General;  Laterality: Left;   COLONOSCOPY     WISDOM TOOTH EXTRACTION     Patient Active Problem List   Diagnosis Date Noted   Cancer of left breast ([place]) 11/25/2021   Iron deficiency anemia due to chronic blood loss 05/24/2021   Genetic testing 04/22/2021   Ductal carcinoma in situ (DCIS) of left breast 04/08/2021    REFERRING DIAG: DCIS Lt breast  THERAPY DIAG:  Stiffness of left shoulder, not elsewhere classified  Breast edema  Nontraumatic hematoma of soft tissue  Ductal carcinoma in situ (DCIS) of left breast  PERTINENT HISTORY: Left lumpectomy 05/01/21 no SLNB with Dr. Barry Dienes.  Large hematoma formation. Radiation completed  PRECAUTIONS: Other: very small lymphedema risk Lt UE and breast due to radiation  SUBJECTIVE: If I sleep on my left shoulder my anterior shoulder gets sore at times or with lifting my son it catches in the front and side of my arm, and I haven't noticed it in the back catching like it used to. The last few days I have had a constant achy  pain in my shoulder that gets better by evening. .  Are you having pain? yes-  it is  0/10 in shoulder today at rest, but when it gets triggered it can reach a 6 or 7 Pain location: anterior/posterior shoulder Pain description: sharp  Aggravating factors: sleeping on it Relieving factors: rest   OBJECTIVE:  PALPATION: Very hard mandarin orange size hematoma under lateral breast incision, +2 ttp axillary palpation with some puffiness here.  Overall breast not swollen.   12/23/2021 Hematoma is softer with less defined borders, pt is very tender today at lateral lower ribcage   OBSERVATIONS / OTHER ASSESSMENTS: 07/22/21: radiated field still red and healing especially inferior to breast, and peeling blister area in the axilla.        07/02/21: healing hematoma Lt lateral breast, puffiness Lt axilla, wearing soft cotton eye clasp front bra which has no compression but pt reports is comfortable and the only fabric that is okay to wear currently. 11/04/21- hematoma still firm and palpable in left lateral breast, educated pt to try and sleep in her compression bra   UPPER EXTREMITY AROM/PROM:   A/PROM RIGHT  07/02/2021     Shoulder extension 53   Shoulder flexion 174   Shoulder abduction 165   Shoulder internal rotation Behind the back to T6  Shoulder external rotation 90                           (Blank rows = not tested)   A/PROM LEFT  07/02/2021 07/22/21 08/13/21 08/28/21 11/04/21 12/23/2021 02/28/2022  Shoulder extension 54    66 60 60  Shoulder flexion  145 - ache top of shoulde 145 - ache top of shoulde 155 - with top of the shoulder pinch 155 - no pinch  151 - feels pain coming on 155 pulls at rib 159  Shoulder abduction 160 155 - ache top of the shoulder  155 - ache top of the shoulder  167- no pinch 150 - feels pain coming on 150 with pinch at end range 140 Fear of pinch  Shoulder internal rotation Behind the back to T4     T8 with pinch   Shoulder external rotation 90     94 with pinch at  endrange                           (Blank rows = not tested)   TODAY'S TREATMENT   04/25/2022  STM to  left Pectorals, UT,Lt lats in supine and  SL serratus, infraspinatus and rhomboids   with cocoa butter  and cupping to infraspinatus and scapular area Supine alphabet x 1, rhythmic stabs with manual resistance 2 x 20 sec Supine horizontal abd with yellow  x 10, standing scapular retraction and extension with red x 10 4 D Ball rolls on wall 3 x 5  Biceps curls 1# x 10 B    04/17/2021  STM to  left Pectorals, UT,Lt lats in supine and  SL serratus, infraspinatus and rhomboids   with cocoa butter  and cupping to infraspinatus and scapular area Attempted PROM to left shoulder but pt was guarded and getting discomfort so stopped. Serratus punches x 10  03/14/2022  STM to  left Pectorals, UT,Lt lats in supine and  SL serratus, infraspinatus and rhomboids  and triceps, with cocoa butter  and cupping to infraspinatus and scapular area Supine AROM shoulder Flexion, scaption, horizontal abd x 5 Supine wand flexion and scaption x 5 ea Alphabet with 2 # x 1, SL ER 0# x 15 Tried standing biceps curls with 1# but was uncomfortable in posterior shoulder so held.further exercises Discussed use of gentle ice to posterior shoulder 1-2x/day. She may also try a Salonpas. Also discussed seeing an orthopedist secondary to not continuing to improve  02/28/2022 STM to  left Pectorals, UT,Lt lats in supine and  SL serratus and rhomboids , with cocoa butter  Supine AROM shoulder Flexion, scaption, horizontal abd x 5 Alphabet with 1 # x 1, SL ER 0# x 15 Standing scapular retraction and shoulder extension with red theraband x 10, Bilateral ER with yellow x 10, red x 7 Biceps curls 1 #x 10, 2 # x 10, 3# x4 held due to post arm discomfort 4 D ball rolls on wall x 10 Emphasis on proper posture and form with VC's prn Discussed holding overhead presses and starting with light wt for biceps curls ( ie 2 # using  good form/posture and progressing band for ER to 15 or 20 reps with yellow before moving to red. Pts left arm fatigues very quickly.   02/28/2022  STM to  left Pectorals, UT,Lt lats in supine and  SL serratus and rhomboids , with cocoa butter  Supine AROM  shoulder Flexion, scaption, horizontal abd x 5 Standing scapular retraction and shoulder extension with red theraband x 10, Bilateral ER with yellow x 10. Gave pt new band to use at work Measured shoulder ROM for PN   02/03/2022 STM to  left Pectorals, UT,Lt lats and serratus and rhomboids in supine and Rt S/L, with cocoa butter  Supine wand flexion and scaption x 4 ea Supine AROM shoulder Flexion, scaption, horizontal abd x 5 SL ER x 15 no resistance. Tried 1# wt but was painful, supine alphabet 1# A-Z, tried serratus punches but held secondary to increased achiness Standing scapular retraction and shoulder extension with red band x 10 reps, bilateral ER with yellow x 10. Ball rolls on wall x 10-15 reps up and down, side to side, clockwise and counter clockwise. Pt was very fatigued especially with ball rolls  01/27/2022 STM to  left Pectorals, UT,Lt lats and serratus and rhomboids in supine and Rt S/L, with cocoa butter  Supine AROM shoulder Flexion, scaption, horizontal abd x 5 SL ER with 1 # x 10, alphabet 1 # A-Z, supine punches x 10 0# Supine horizontal abd with yellow x 10 Alternating isometrics and rhythmic stabs 2 x 30 sec  PATIENT EDUCATION:  Access Code: [email or web address] URL: [email or web address] Date: 01/06/2022 Prepared by: Cheral Almas  Exercises - Supine Shoulder Alphabet  - 1 x daily - 7 x weekly - Supine Single Arm Shoulder Protraction  - 1 x daily - 7 x weekly - 10 reps  Education details: theraband exs SR, IR and ER with yellow Person educated: Patient Education method: Explanation, Demonstration, Tactile cues, Verbal cues, and Handouts Education comprehension: verbalized understanding, returned  demonstration, verbal cues required, and needs further education    HOME EXERCISE PROGRAM: Access Code: [email or web address] URL: [email or web address] Date: 08/28/2021 Prepared by: Shan Levans  Exercises - Serratus Forearm Push Up Plus at Wall with Elbows - 1 x daily - 3 x weekly - 1 sets - 10 reps - no hold - Standing Shoulder Row with Anchored Resistance - 1 x daily - 3 x weekly - 1-3 sets - 10 reps - 3sec hold - Shoulder extension with resistance - Neutral - 1 x daily - 3 x weekly - 1-3 sets - 10 reps - 3sec hold - Standing Shoulder External Rotation with Resistance - 1 x daily - 3 x weekly - 1-3 sets - 10 reps - 3sec hold - Single Arm Doorway Pec Stretch at 90 Degrees Abduction - 1-2 x daily - 7 x weekly - 2 reps - 30-60sec hold  ASSESSMENT:   CLINICAL IMPRESSION: Continued soft tissue mobilization and progress to more strengthening. Pts posterior shoulder still gets exacerbated with certain activities, however, she was able to perform most activities without increased pain. Continue to suggest an orthopedic visit to see if she can get back to her usual routine..  OBJECTIVE IMPAIRMENTS decreased knowledge of use of DME, decreased ROM, and increased edema.    ACTIVITY LIMITATIONS community activity and yard work.    PERSONAL FACTORS 1 comorbidity: hematoma complication  are also affecting patient's functional outcome.      REHAB POTENTIAL: Excellent   CLINICAL DECISION MAKING: Stable/uncomplicated   EVALUATION COMPLEXITY: Low  GOALS: Goals reviewed with patient? Yes     LONG TERM GOALS: Target date: 05/23/2022   Pt will improve Lt UE AROM to equal to the Rt UE and without pain Baseline:  Goal status: ONGOING   2.  Pt will be ind with self care regarding Lt breast  hematoma including self massage, compression bra, and use of foam or swell spot Baseline:  Goal status: ONGOING - pt reports it has softened but it is still there  3. Pt will not demonstrate shooting breast   pains to allow improved comfort.  Baseline:  Goals status: MET if not sleeping on left side 4. Pt will be independent in HEP for left shoulder ROM and in particular strengthening             GOAL STATUS : progressing PLAN: PT FREQUENCY: 1x/week   PT DURATION: 12 weeks    PLANNED INTERVENTIONS: Therapeutic exercises, Patient/Family education, Joint mobilization, DME instructions, Manual lymph drainage, Compression bandaging, scar mobilization, Taping, and Manual therapy, iontophoresis with dexamethasone*   PLAN FOR NEXT SESSION:  ,STM, scapular and GH mob, working on increase AROM and decreasing shoulder pain with overhead reach; PROM,MLD lateral trunk prn, review theraband exs. and progress strength to prevent posterior impingement, MLD lateral trunk/hematoma area prn., ABC exercises?       Claris Pong, PT 04/25/2022, 9:57 AM

## 2022-05-02 ENCOUNTER — Ambulatory Visit: Payer: 59 | Attending: Radiation Oncology

## 2022-05-02 DIAGNOSIS — N6489 Other specified disorders of breast: Secondary | ICD-10-CM | POA: Insufficient documentation

## 2022-05-02 DIAGNOSIS — D0512 Intraductal carcinoma in situ of left breast: Secondary | ICD-10-CM | POA: Diagnosis present

## 2022-05-02 DIAGNOSIS — M25612 Stiffness of left shoulder, not elsewhere classified: Secondary | ICD-10-CM | POA: Diagnosis present

## 2022-05-02 DIAGNOSIS — M7981 Nontraumatic hematoma of soft tissue: Secondary | ICD-10-CM | POA: Insufficient documentation

## 2022-05-02 NOTE — Therapy (Signed)
OUTPATIENT PHYSICAL THERAPY TREATMENT NOTE   Patient Name: Beth Stephens MRN: [id number] DOB:1975-12-05, 47 y.o., female Today's Date: 05/02/2022  PCP: Janie Morning, DO REFERRING PROVIDER: Eppie Gibson, MD  END OF SESSION:   PT End of Session - 05/02/22 0907     Visit Number 24    Number of Visits 27    Date for PT Re-Evaluation 05/23/22    PT Start Time 0908    PT Stop Time 1000    PT Time Calculation (min) 52 min    Activity Tolerance Patient tolerated treatment well    Behavior During Therapy [facility] for tasks assessed/performed                   Past Medical History:  Diagnosis Date   Cancer ([place])    Breast Cancer   Colitis    Colitis    Past Surgical History:  Procedure Laterality Date   BREAST LUMPECTOMY WITH RADIOACTIVE SEED LOCALIZATION Left 05/01/2021   Procedure: LEFT BREAST LUMPECTOMY WITH RADIOACTIVE SEED LOCALIZATION;  Surgeon: Stark Klein, MD;  Location: [facility];  Service: General;  Laterality: Left;   COLONOSCOPY     WISDOM TOOTH EXTRACTION     Patient Active Problem List   Diagnosis Date Noted   Cancer of left breast ([place]) 11/25/2021   Iron deficiency anemia due to chronic blood loss 05/24/2021   Genetic testing 04/22/2021   Ductal carcinoma in situ (DCIS) of left breast 04/08/2021    REFERRING DIAG: DCIS Lt breast  THERAPY DIAG:  Stiffness of left shoulder, not elsewhere classified  Breast edema  Nontraumatic hematoma of soft tissue  Ductal carcinoma in situ (DCIS) of left breast  PERTINENT HISTORY: Left lumpectomy 05/01/21 no SLNB with Dr. Barry Dienes.  Large hematoma formation. Radiation completed  PRECAUTIONS: Other: very small lymphedema risk Lt UE and breast due to radiation  SUBJECTIVE:  .I slept on my shoulder and I woke up with so much pain I layed awake with pain for 20 min. I have been using Voltaren gel. No sharp pains during the day.It has been a constant achy for the last 4 days, but not bad right now.  Are you having  pain? yes-  it is  0/10 in shoulder today at rest, but when it gets triggered it can reach a 6 or 7 Pain location: anterior/posterior shoulder Pain description: sharp  Aggravating factors: sleeping on it Relieving factors: rest   OBJECTIVE:  PALPATION: Very hard mandarin orange size hematoma under lateral breast incision, +2 ttp axillary palpation with some puffiness here.  Overall breast not swollen.   12/23/2021 Hematoma is softer with less defined borders, pt is very tender today at lateral lower ribcage   OBSERVATIONS / OTHER ASSESSMENTS: 07/22/21: radiated field still red and healing especially inferior to breast, and peeling blister area in the axilla.        07/02/21: healing hematoma Lt lateral breast, puffiness Lt axilla, wearing soft cotton eye clasp front bra which has no compression but pt reports is comfortable and the only fabric that is okay to wear currently. 11/04/21- hematoma still firm and palpable in left lateral breast, educated pt to try and sleep in her compression bra   UPPER EXTREMITY AROM/PROM:   A/PROM RIGHT  07/02/2021     Shoulder extension 53   Shoulder flexion 174   Shoulder abduction 165   Shoulder internal rotation Behind the back to T6   Shoulder external rotation 90                           (  Blank rows = not tested)   A/PROM LEFT  07/02/2021 07/22/21 08/13/21 08/28/21 11/04/21 12/23/2021 02/28/2022  Shoulder extension 54    66 60 60  Shoulder flexion  145 - ache top of shoulde 145 - ache top of shoulde 155 - with top of the shoulder pinch 155 - no pinch  151 - feels pain coming on 155 pulls at rib 159  Shoulder abduction 160 155 - ache top of the shoulder  155 - ache top of the shoulder  167- no pinch 150 - feels pain coming on 150 with pinch at end range 140 Fear of pinch  Shoulder internal rotation Behind the back to T4     T8 with pinch   Shoulder external rotation 90     94 with pinch at endrange                           (Blank rows = not tested)   TODAY'S  TREATMENT     05/02/2022  STM to  left Pectorals, UT,Lt lats in supine and  SL serratus, infraspinatus, supraspinatus and rhomboids   with cocoa butter Supine alphabet x 1 0#, and x 1 with 1#, rhythmic stabs with manual resistance 2 x 20 sec, IR/ER alternating isometrics 2 x 20 Supine horizontal abd with yellow  x 15, supine chest press 1# ea x 15  Standing jobes flexion and scaption x 10,  4DBall rolls on wall x 15 ea Biceps curls  B1# x 10   04/25/2022  STM to  left Pectorals, UT,Lt lats in supine and  SL serratus, infraspinatus and rhomboids   with cocoa butter  and cupping to infraspinatus and scapular area Supine alphabet x 1, rhythmic stabs with manual resistance 2 x 20 sec Supine horizontal abd with yellow  x 10, standing scapular retraction and extension with red x 10 4 D Ball rolls on wall 3 x 5  Biceps curls 1# x 10 B    04/17/2021  STM to  left Pectorals, UT,Lt lats in supine and  SL serratus, infraspinatus and rhomboids   with cocoa butter  and cupping to infraspinatus and scapular area Attempted PROM to left shoulder but pt was guarded and getting discomfort so stopped. Serratus punches x 10  03/14/2022  STM to  left Pectorals, UT,Lt lats in supine and  SL serratus, infraspinatus and rhomboids  and triceps, with cocoa butter  and cupping to infraspinatus and scapular area Supine AROM shoulder Flexion, scaption, horizontal abd x 5 Supine wand flexion and scaption x 5 ea Alphabet with 2 # x 1, SL ER 0# x 15 Tried standing biceps curls with 1# but was uncomfortable in posterior shoulder so held.further exercises Discussed use of gentle ice to posterior shoulder 1-2x/day. She may also try a Salonpas. Also discussed seeing an orthopedist secondary to not continuing to improve  02/28/2022 STM to  left Pectorals, UT,Lt lats in supine and  SL serratus and rhomboids , with cocoa butter  Supine AROM shoulder Flexion, scaption, horizontal abd x 5 Alphabet with 1 # x 1, SL ER 0#  x 15 Standing scapular retraction and shoulder extension with red theraband x 10, Bilateral ER with yellow x 10, red x 7 Biceps curls 1 #x 10, 2 # x 10, 3# x4 held due to post arm discomfort 4 D ball rolls on wall x 10 Emphasis on proper posture and form with VC's prn Discussed holding overhead presses and starting with  light wt for biceps curls ( ie 2 # using good form/posture and progressing band for ER to 15 or 20 reps with yellow before moving to red. Pts left arm fatigues very quickly.   02/28/2022  STM to  left Pectorals, UT,Lt lats in supine and  SL serratus and rhomboids , with cocoa butter  Supine AROM shoulder Flexion, scaption, horizontal abd x 5 Standing scapular retraction and shoulder extension with red theraband x 10, Bilateral ER with yellow x 10. Gave pt new band to use at work Measured shoulder ROM for PN   02/03/2022 STM to  left Pectorals, UT,Lt lats and serratus and rhomboids in supine and Rt S/L, with cocoa butter  Supine wand flexion and scaption x 4 ea Supine AROM shoulder Flexion, scaption, horizontal abd x 5 SL ER x 15 no resistance. Tried 1# wt but was painful, supine alphabet 1# A-Z, tried serratus punches but held secondary to increased achiness Standing scapular retraction and shoulder extension with red band x 10 reps, bilateral ER with yellow x 10. Ball rolls on wall x 10-15 reps up and down, side to side, clockwise and counter clockwise. Pt was very fatigued especially with ball rolls  01/27/2022 STM to  left Pectorals, UT,Lt lats and serratus and rhomboids in supine and Rt S/L, with cocoa butter  Supine AROM shoulder Flexion, scaption, horizontal abd x 5 SL ER with 1 # x 10, alphabet 1 # A-Z, supine punches x 10 0# Supine horizontal abd with yellow x 10 Alternating isometrics and rhythmic stabs 2 x 30 sec  PATIENT EDUCATION:  Access Code: [email or web address] URL: [email or web address] Date: 01/06/2022 Prepared by: Cheral Almas  Exercises -  Supine Shoulder Alphabet  - 1 x daily - 7 x weekly - Supine Single Arm Shoulder Protraction  - 1 x daily - 7 x weekly - 10 reps  Education details: theraband exs SR, IR and ER with yellow Person educated: Patient Education method: Explanation, Demonstration, Tactile cues, Verbal cues, and Handouts Education comprehension: verbalized understanding, returned demonstration, verbal cues required, and needs further education    HOME EXERCISE PROGRAM: Access Code: [email or web address] URL: [email or web address] Date: 08/28/2021 Prepared by: Shan Levans  Exercises - Serratus Forearm Push Up Plus at Wall with Elbows - 1 x daily - 3 x weekly - 1 sets - 10 reps - no hold - Standing Shoulder Row with Anchored Resistance - 1 x daily - 3 x weekly - 1-3 sets - 10 reps - 3sec hold - Shoulder extension with resistance - Neutral - 1 x daily - 3 x weekly - 1-3 sets - 10 reps - 3sec hold - Standing Shoulder External Rotation with Resistance - 1 x daily - 3 x weekly - 1-3 sets - 10 reps - 3sec hold - Single Arm Doorway Pec Stretch at 90 Degrees Abduction - 1-2 x daily - 7 x weekly - 2 reps - 30-60sec hold  ASSESSMENT:   CLINICAL IMPRESSION: Pt was able to perform exercises in clinic today with fatigue mainly. 1 short episode of discomfort with horizontal abd but abolished when performing in a small ROM and no increased pain at completion of therapy.   OBJECTIVE IMPAIRMENTS decreased knowledge of use of DME, decreased ROM, and increased edema.    ACTIVITY LIMITATIONS community activity and yard work.    PERSONAL FACTORS 1 comorbidity: hematoma complication  are also affecting patient's functional outcome.      REHAB POTENTIAL: Excellent   CLINICAL DECISION MAKING: Stable/uncomplicated   EVALUATION COMPLEXITY: Low  GOALS:  Goals reviewed with patient? Yes     LONG TERM GOALS: Target date: 05/23/2022   Pt will improve Lt UE AROM to equal to the Rt UE and without pain Baseline:  Goal status:  ONGOING   2.  Pt will be ind with self care regarding Lt breast hematoma including self massage, compression bra, and use of foam or swell spot Baseline:  Goal status: ONGOING - pt reports it has softened but it is still there  3. Pt will not demonstrate shooting breast  pains to allow improved comfort.  Baseline:  Goals status: MET if not sleeping on left side 4. Pt will be independent in HEP for left shoulder ROM and in particular strengthening             GOAL STATUS : progressing PLAN: PT FREQUENCY: 1x/week   PT DURATION: 12 weeks    PLANNED INTERVENTIONS: Therapeutic exercises, Patient/Family education, Joint mobilization, DME instructions, Manual lymph drainage, Compression bandaging, scar mobilization, Taping, and Manual therapy, iontophoresis with dexamethasone*   PLAN FOR NEXT SESSION:  ,STM, scapular and GH mob, working on increase AROM and decreasing shoulder pain with overhead reach; PROM,MLD lateral trunk prn, review theraband exs. and progress strength to prevent posterior impingement, MLD lateral trunk/hematoma area prn., ABC exercises?       Claris Pong, PT 05/02/2022, 10:05 AM

## 2022-05-16 ENCOUNTER — Ambulatory Visit: Payer: 59

## 2022-05-16 DIAGNOSIS — M7981 Nontraumatic hematoma of soft tissue: Secondary | ICD-10-CM

## 2022-05-16 DIAGNOSIS — D0512 Intraductal carcinoma in situ of left breast: Secondary | ICD-10-CM

## 2022-05-16 DIAGNOSIS — M25612 Stiffness of left shoulder, not elsewhere classified: Secondary | ICD-10-CM

## 2022-05-16 DIAGNOSIS — N6489 Other specified disorders of breast: Secondary | ICD-10-CM

## 2022-05-16 NOTE — Therapy (Signed)
OUTPATIENT PHYSICAL THERAPY TREATMENT NOTE   Patient Name: Beth Stephens MRN: [id number] DOB:03-14-1976, 47 y.o., female Today's Date: 05/16/2022  PCP: Janie Morning, DO REFERRING PROVIDER: Eppie Gibson, MD  END OF SESSION:   PT End of Session - 05/16/22 0918     Visit Number 25    Number of Visits 27    Date for PT Re-Evaluation 05/23/22    Authorization Type [id number]    PT Start Time 0919   late   PT Stop Time 0958    PT Time Calculation (min) 39 min    Activity Tolerance Patient tolerated treatment well    Behavior During Therapy [facility] for tasks assessed/performed                   Past Medical History:  Diagnosis Date   Cancer ([place])    Breast Cancer   Colitis    Colitis    Past Surgical History:  Procedure Laterality Date   BREAST LUMPECTOMY WITH RADIOACTIVE SEED LOCALIZATION Left 05/01/2021   Procedure: LEFT BREAST LUMPECTOMY WITH RADIOACTIVE SEED LOCALIZATION;  Surgeon: Stark Klein, MD;  Location: [facility];  Service: General;  Laterality: Left;   COLONOSCOPY     WISDOM TOOTH EXTRACTION     Patient Active Problem List   Diagnosis Date Noted   Cancer of left breast ([place]) 11/25/2021   Iron deficiency anemia due to chronic blood loss 05/24/2021   Genetic testing 04/22/2021   Ductal carcinoma in situ (DCIS) of left breast 04/08/2021    REFERRING DIAG: DCIS Lt breast  THERAPY DIAG:  Stiffness of left shoulder, not elsewhere classified  Breast edema  Nontraumatic hematoma of soft tissue  Ductal carcinoma in situ (DCIS) of left breast  PERTINENT HISTORY: Left lumpectomy 05/01/21 no SLNB with Dr. Barry Dienes.  Large hematoma formation. Radiation completed  PRECAUTIONS: Other: very small lymphedema risk Lt UE and breast due to radiation  SUBJECTIVE:  I talked to the Dr. At the [place]. He thinks I should do an MRI. I am having so much trouble now opening lids, and just using my arm. My left shoulder hurts even if I sleep on the right side. Sleeping on my  shoulder wrong after I got home from [place] is still causing severe pain. It gets a little better with the patches    Are you having pain? yes-  it is  3-4/10 at rest in shoulder today  but when it gets triggered it can reach a 6 or 7 Pain location: anterior/posterior shoulder Pain description: sharp  Aggravating factors: sleeping on it Relieving factors: rest   OBJECTIVE:  PALPATION: Very hard mandarin orange size hematoma under lateral breast incision, +2 ttp axillary palpation with some puffiness here.  Overall breast not swollen.   12/23/2021 Hematoma is softer with less defined borders, pt is very tender today at lateral lower ribcage   OBSERVATIONS / OTHER ASSESSMENTS: 07/22/21: radiated field still red and healing especially inferior to breast, and peeling blister area in the axilla.        07/02/21: healing hematoma Lt lateral breast, puffiness Lt axilla, wearing soft cotton eye clasp front bra which has no compression but pt reports is comfortable and the only fabric that is okay to wear currently. 11/04/21- hematoma still firm and palpable in left lateral breast, educated pt to try and sleep in her compression bra   UPPER EXTREMITY AROM/PROM:   A/PROM RIGHT  07/02/2021     Shoulder extension 53   Shoulder flexion 174  Shoulder abduction 165   Shoulder internal rotation Behind the back to T6   Shoulder external rotation 90                           (Blank rows = not tested)   A/PROM LEFT  07/02/2021 07/22/21 08/13/21 08/28/21 11/04/21 12/23/2021 02/28/2022  Shoulder extension 54    66 60 60  Shoulder flexion  145 - ache top of shoulde 145 - ache top of shoulde 155 - with top of the shoulder pinch 155 - no pinch  151 - feels pain coming on 155 pulls at rib 159  Shoulder abduction 160 155 - ache top of the shoulder  155 - ache top of the shoulder  167- no pinch 150 - feels pain coming on 150 with pinch at end range 140 Fear of pinch  Shoulder internal rotation Behind the back to T4     T8  with pinch   Shoulder external rotation 90     94 with pinch at endrange                           (Blank rows = not tested)   TODAY'S TREATMENT   05/16/2022  STM to  left Pectorals, UT,Lt lats, anterior, lateral and posterior upper arm in supine and  SL serratus, infraspinatus, supraspinatus and rhomboids   with cocoa butter and cupping all around scapular area.    05/02/2022  STM to  left Pectorals, UT,Lt lats in supine and  SL serratus, infraspinatus, supraspinatus and rhomboids   with cocoa butter Supine alphabet x 1 0#, and x 1 with 1#, rhythmic stabs with manual resistance 2 x 20 sec, IR/ER alternating isometrics 2 x 20 Supine horizontal abd with yellow  x 15, supine chest press 1# ea x 15  Standing jobes flexion and scaption x 10,  4DBall rolls on wall x 15 ea Biceps curls  B1# x 10   04/25/2022  STM to  left Pectorals, UT,Lt lats in supine and  SL serratus, infraspinatus and rhomboids   with cocoa butter  and cupping to infraspinatus and scapular area Supine alphabet x 1, rhythmic stabs with manual resistance 2 x 20 sec Supine horizontal abd with yellow  x 10, standing scapular retraction and extension with red x 10 4 D Ball rolls on wall 3 x 5  Biceps curls 1# x 10 B    04/17/2021  STM to  left Pectorals, UT,Lt lats in supine and  SL serratus, infraspinatus and rhomboids   with cocoa butter  and cupping to infraspinatus and scapular area Attempted PROM to left shoulder but pt was guarded and getting discomfort so stopped. Serratus punches x 10  03/14/2022  STM to  left Pectorals, UT,Lt lats in supine and  SL serratus, infraspinatus and rhomboids  and triceps, with cocoa butter  and cupping to infraspinatus and scapular area Supine AROM shoulder Flexion, scaption, horizontal abd x 5 Supine wand flexion and scaption x 5 ea Alphabet with 2 # x 1, SL ER 0# x 15 Tried standing biceps curls with 1# but was uncomfortable in posterior shoulder so held.further  exercises Discussed use of gentle ice to posterior shoulder 1-2x/day. She may also try a Salonpas. Also discussed seeing an orthopedist secondary to not continuing to improve  02/28/2022 STM to  left Pectorals, UT,Lt lats in supine and  SL serratus and rhomboids , with cocoa butter  Supine AROM shoulder Flexion, scaption, horizontal abd x 5 Alphabet with 1 # x 1, SL ER 0# x 15 Standing scapular retraction and shoulder extension with red theraband x 10, Bilateral ER with yellow x 10, red x 7 Biceps curls 1 #x 10, 2 # x 10, 3# x4 held due to post arm discomfort 4 D ball rolls on wall x 10 Emphasis on proper posture and form with VC's prn Discussed holding overhead presses and starting with light wt for biceps curls ( ie 2 # using good form/posture and progressing band for ER to 15 or 20 reps with yellow before moving to red. Pts left arm fatigues very quickly.   02/28/2022  STM to  left Pectorals, UT,Lt lats in supine and  SL serratus and rhomboids , with cocoa butter  Supine AROM shoulder Flexion, scaption, horizontal abd x 5 Standing scapular retraction and shoulder extension with red theraband x 10, Bilateral ER with yellow x 10. Gave pt new band to use at work Measured shoulder ROM for PN   02/03/2022 STM to  left Pectorals, UT,Lt lats and serratus and rhomboids in supine and Rt S/L, with cocoa butter  Supine wand flexion and scaption x 4 ea Supine AROM shoulder Flexion, scaption, horizontal abd x 5 SL ER x 15 no resistance. Tried 1# wt but was painful, supine alphabet 1# A-Z, tried serratus punches but held secondary to increased achiness Standing scapular retraction and shoulder extension with red band x 10 reps, bilateral ER with yellow x 10. Ball rolls on wall x 10-15 reps up and down, side to side, clockwise and counter clockwise. Pt was very fatigued especially with ball rolls  01/27/2022 STM to  left Pectorals, UT,Lt lats and serratus and rhomboids in supine and Rt S/L, with  cocoa butter  Supine AROM shoulder Flexion, scaption, horizontal abd x 5 SL ER with 1 # x 10, alphabet 1 # A-Z, supine punches x 10 0# Supine horizontal abd with yellow x 10 Alternating isometrics and rhythmic stabs 2 x 30 sec  PATIENT EDUCATION:  Access Code: [id number] URL: [email or web address] Date: 01/06/2022 Prepared by: Cheral Almas  Exercises - Supine Shoulder Alphabet  - 1 x daily - 7 x weekly - Supine Single Arm Shoulder Protraction  - 1 x daily - 7 x weekly - 10 reps  Education details: theraband exs SR, IR and ER with yellow Person educated: Patient Education method: Explanation, Demonstration, Tactile cues, Verbal cues, and Handouts Education comprehension: verbalized understanding, returned demonstration, verbal cues required, and needs further education    HOME EXERCISE PROGRAM: Access Code: [email or web address] URL: [email or web address] Date: 08/28/2021 Prepared by: Shan Levans  Exercises - Serratus Forearm Push Up Plus at Wall with Elbows - 1 x daily - 3 x weekly - 1 sets - 10 reps - no hold - Standing Shoulder Row with Anchored Resistance - 1 x daily - 3 x weekly - 1-3 sets - 10 reps - 3sec hold - Shoulder extension with resistance - Neutral - 1 x daily - 3 x weekly - 1-3 sets - 10 reps - 3sec hold - Standing Shoulder External Rotation with Resistance - 1 x daily - 3 x weekly - 1-3 sets - 10 reps - 3sec hold - Single Arm Doorway Pec Stretch at 90 Degrees Abduction - 1-2 x daily - 7 x weekly - 2 reps - 30-60sec hold  ASSESSMENT:   CLINICAL IMPRESSION: Pt arrived with more constant pain today and limitations in ROM. Tender at posterior arm and with  increased tissue tension/tenderness at lats and infraspinatus. Pt more agreeable to seeing orthopedeic due to pain now all aspects of left shoulder and interfering with home and work activities.  OBJECTIVE IMPAIRMENTS decreased knowledge of use of DME, decreased ROM, and increased edema.    ACTIVITY  LIMITATIONS community activity and yard work.    PERSONAL FACTORS 1 comorbidity: hematoma complication  are also affecting patient's functional outcome.      REHAB POTENTIAL: Excellent   CLINICAL DECISION MAKING: Stable/uncomplicated   EVALUATION COMPLEXITY: Low  GOALS: Goals reviewed with patient? Yes     LONG TERM GOALS: Target date: 05/23/2022   Pt will improve Lt UE AROM to equal to the Rt UE and without pain Baseline:  Goal status: ONGOING   2.  Pt will be ind with self care regarding Lt breast hematoma including self massage, compression bra, and use of foam or swell spot Baseline:  Goal status: ONGOING - pt reports it has softened but it is still there  3. Pt will not demonstrate shooting breast  pains to allow improved comfort.  Baseline:  Goals status: MET if not sleeping on left side 4. Pt will be independent in HEP for left shoulder ROM and in particular strengthening             GOAL STATUS : progressing PLAN: PT FREQUENCY: 1x/week   PT DURATION: 12 weeks    PLANNED INTERVENTIONS: Therapeutic exercises, Patient/Family education, Joint mobilization, DME instructions, Manual lymph drainage, Compression bandaging, scar mobilization, Taping, and Manual therapy, iontophoresis with dexamethasone*   PLAN FOR NEXT SESSION:  ,STM, scapular and GH mob, working on increase AROM and decreasing shoulder pain with overhead reach; PROM,MLD lateral trunk prn, review theraband exs. and progress strength to prevent posterior impingement, MLD lateral trunk/hematoma area prn., ABC exercises?       Claris Pong, PT 05/16/2022, 10:01 AM

## 2022-05-23 ENCOUNTER — Ambulatory Visit: Payer: 59

## 2022-06-06 ENCOUNTER — Ambulatory Visit: Payer: 59 | Attending: Radiation Oncology

## 2022-06-06 DIAGNOSIS — M25612 Stiffness of left shoulder, not elsewhere classified: Secondary | ICD-10-CM | POA: Insufficient documentation

## 2022-06-06 DIAGNOSIS — M7981 Nontraumatic hematoma of soft tissue: Secondary | ICD-10-CM | POA: Diagnosis present

## 2022-06-06 DIAGNOSIS — N6489 Other specified disorders of breast: Secondary | ICD-10-CM | POA: Insufficient documentation

## 2022-06-06 DIAGNOSIS — D0512 Intraductal carcinoma in situ of left breast: Secondary | ICD-10-CM | POA: Diagnosis present

## 2022-06-06 NOTE — Patient Instructions (Signed)
SHOULDER: Flexion - Supine (Cane)        Cancer Re[facility] [phone]    Hold cane in both hands. Raise arms up overhead. Do not allow back to arch. Hold _5__ seconds. Do __5__ times; __2__ times a day.   Shoulder Blade Stretch    Clasp fingers behind head with elbows touching in front of face. Pull elbows back while pressing shoulder blades together. Relax and hold as tolerated, can place pillow under elbow here for comfort as needed and to allow for prolonged stretch.  Repeat __5__ times. Do __2-2__ sessions per day.     SHOULDER: External Rotation - Supine (Cane)    Hold cane with both hands. Rotate arm away from body. Keep elbow on floor and next to body. _5-10__ reps per set, hold 5 seconds, _1-2__ sets per day. Add towel to keep elbow at side.  Copyright  VHI. All rights reserved.

## 2022-06-06 NOTE — Therapy (Signed)
OUTPATIENT PHYSICAL THERAPY TREATMENT NOTE   Patient Name: Beth Stephens MRN: [id number] DOB:1975/09/23, 47 y.o., female Today's Date: 06/06/2022  PCP: Janie Morning, DO REFERRING PROVIDER: Eppie Gibson, MD  END OF SESSION:   PT End of Session - 06/06/22 1111     Visit Number 26    Number of Visits 34    Date for PT Re-Evaluation 08/01/22    PT Start Time 1105    PT Stop Time 1155    PT Time Calculation (min) 50 min    Activity Tolerance Patient tolerated treatment well    Behavior During Therapy WFL for tasks assessed/performed                   Past Medical History:  Diagnosis Date   Cancer ([place])    Breast Cancer   Colitis    Colitis    Past Surgical History:  Procedure Laterality Date   BREAST LUMPECTOMY WITH RADIOACTIVE SEED LOCALIZATION Left 05/01/2021   Procedure: LEFT BREAST LUMPECTOMY WITH RADIOACTIVE SEED LOCALIZATION;  Surgeon: Stark Klein, MD;  Location: [facility];  Service: General;  Laterality: Left;   COLONOSCOPY     WISDOM TOOTH EXTRACTION     Patient Active Problem List   Diagnosis Date Noted   Cancer of left breast ([facility]) 11/25/2021   Iron deficiency anemia due to chronic blood loss 05/24/2021   Genetic testing 04/22/2021   Ductal carcinoma in situ (DCIS) of left breast 04/08/2021    REFERRING DIAG: DCIS Lt breast  THERAPY DIAG:  Stiffness of left shoulder, not elsewhere classified  Breast edema  Nontraumatic hematoma of soft tissue  Ductal carcinoma in situ (DCIS) of left breast  PERTINENT HISTORY: Left lumpectomy 05/01/21 no SLNB with Dr. Barry Dienes.  Large hematoma formation. Radiation completed  PRECAUTIONS: Other: very small lymphedema risk Lt UE and breast due to radiation  SUBJECTIVE:  I sprained my ankle and haven't been able to walk for several weeks. That's why I a haven't  been in. I am limping now. I haven't seen the Dr for my shoulder because I hurt my shoulder.  It has gotten very stiff.   Are you having pain? yes-  it  is  0/10 at rest in shoulder today  but when it gets triggered it can reach a 6 when it catches Pain location: anterior/posterior shoulder Pain description: sharp  Aggravating factors: sleeping on it Relieving factors: rest   OBJECTIVE:  PALPATION: Very hard mandarin orange size hematoma under lateral breast incision, +2 ttp axillary palpation with some puffiness here.  Overall breast not swollen.   12/23/2021 Hematoma is softer with less defined borders, pt is very tender today at lateral lower ribcage   OBSERVATIONS / OTHER ASSESSMENTS: 07/22/21: radiated field still red and healing especially inferior to breast, and peeling blister area in the axilla.        07/02/21: healing hematoma Lt lateral breast, puffiness Lt axilla, wearing soft cotton eye clasp front bra which has no compression but pt reports is comfortable and the only fabric that is okay to wear currently. 11/04/21- hematoma still firm and palpable in left lateral breast, educated pt to try and sleep in her compression bra   UPPER EXTREMITY AROM/PROM:   A/PROM RIGHT  07/02/2021     Shoulder extension 53   Shoulder flexion 174   Shoulder abduction 165   Shoulder internal rotation Behind the back to T6   Shoulder external rotation 90                           (  Blank rows = not tested)   A/PROM LEFT  07/02/2021 07/22/21 08/13/21 08/28/21 11/04/21 12/23/2021 02/28/2022 06/06/2022  Shoulder extension 54    66 60 60 40  Shoulder flexion  145 - ache top of shoulde 145 - ache top of shoulde 155 - with top of the shoulder pinch 155 - no pinch  151 - feels pain coming on 155 pulls at rib 159 95 pinch  Shoulder abduction 160 155 - ache top of the shoulder  155 - ache top of the shoulder  167- no pinch 150 - feels pain coming on 150 with pinch at end range 140 Fear of pinch 72pain  Shoulder internal rotation Behind the back to T4     T8 with pinch  Left buttocks  Shoulder external rotation 90     94 with pinch at endrange                             (Blank rows = not tested)    06/06/2022 PROM left shoulder Flex 115, IR at 45 degrees abd 63, ER at 45 deg 37   TODAY'S TREATMENT  Measured left shoulder A/PROM Pt in supine for soft tissue mobilization to left UT, Lats, pectorals, scapular area with cocoa butter. Cupping to all borders of scapular region. PROM left shoulder flexion, IR and ER with VC's to pt to relax Supine AA shoulder flexion with clasped hands, switched to wand exs. VC's to depress scapula and to avoid pinch. Updated pts HEP.   05/16/2022  STM to  left Pectorals, UT,Lt lats, anterior, lateral and posterior upper arm in supine and  SL serratus, infraspinatus, supraspinatus and rhomboids   with cocoa butter and cupping all around scapular area.    05/02/2022  STM to  left Pectorals, UT,Lt lats in supine and  SL serratus, infraspinatus, supraspinatus and rhomboids   with cocoa butter Supine alphabet x 1 0#, and x 1 with 1#, rhythmic stabs with manual resistance 2 x 20 sec, IR/ER alternating isometrics 2 x 20 Supine horizontal abd with yellow  x 15, supine chest press 1# ea x 15  Standing jobes flexion and scaption x 10,  4DBall rolls on wall x 15 ea Biceps curls  B1# x 10   04/25/2022  STM to  left Pectorals, UT,Lt lats in supine and  SL serratus, infraspinatus and rhomboids   with cocoa butter  and cupping to infraspinatus and scapular area Supine alphabet x 1, rhythmic stabs with manual resistance 2 x 20 sec Supine horizontal abd with yellow  x 10, standing scapular retraction and extension with red x 10 4 D Ball rolls on wall 3 x 5  Biceps curls 1# x 10 B    04/17/2021  STM to  left Pectorals, UT,Lt lats in supine and  SL serratus, infraspinatus and rhomboids   with cocoa butter  and cupping to infraspinatus and scapular area Attempted PROM to left shoulder but pt was guarded and getting discomfort so stopped. Serratus punches x 10  03/14/2022  STM to  left Pectorals, UT,Lt lats in supine and  SL serratus,  infraspinatus and rhomboids  and triceps, with cocoa butter  and cupping to infraspinatus and scapular area Supine AROM shoulder Flexion, scaption, horizontal abd x 5 Supine wand flexion and scaption x 5 ea Alphabet with 2 # x 1, SL ER 0# x 15 Tried standing biceps curls with 1# but was uncomfortable in posterior shoulder so held.further exercises  Discussed use of gentle ice to posterior shoulder 1-2x/day. She may also try a Salonpas. Also discussed seeing an orthopedist secondary to not continuing to improve  02/28/2022 STM to  left Pectorals, UT,Lt lats in supine and  SL serratus and rhomboids , with cocoa butter  Supine AROM shoulder Flexion, scaption, horizontal abd x 5 Alphabet with 1 # x 1, SL ER 0# x 15 Standing scapular retraction and shoulder extension with red theraband x 10, Bilateral ER with yellow x 10, red x 7 Biceps curls 1 #x 10, 2 # x 10, 3# x4 held due to post arm discomfort 4 D ball rolls on wall x 10 Emphasis on proper posture and form with VC's prn Discussed holding overhead presses and starting with light wt for biceps curls ( ie 2 # using good form/posture and progressing band for ER to 15 or 20 reps with yellow before moving to red. Pts left arm fatigues very quickly.   02/28/2022  STM to  left Pectorals, UT,Lt lats in supine and  SL serratus and rhomboids , with cocoa butter  Supine AROM shoulder Flexion, scaption, horizontal abd x 5 Standing scapular retraction and shoulder extension with red theraband x 10, Bilateral ER with yellow x 10. Gave pt new band to use at work Measured shoulder ROM for PN   02/03/2022 STM to  left Pectorals, UT,Lt lats and serratus and rhomboids in supine and Rt S/L, with cocoa butter  Supine wand flexion and scaption x 4 ea Supine AROM shoulder Flexion, scaption, horizontal abd x 5 SL ER x 15 no resistance. Tried 1# wt but was painful, supine alphabet 1# A-Z, tried serratus punches but held secondary to increased achiness Standing  scapular retraction and shoulder extension with red band x 10 reps, bilateral ER with yellow x 10. Ball rolls on wall x 10-15 reps up and down, side to side, clockwise and counter clockwise. Pt was very fatigued especially with ball rolls  01/27/2022 STM to  left Pectorals, UT,Lt lats and serratus and rhomboids in supine and Rt S/L, with cocoa butter  Supine AROM shoulder Flexion, scaption, horizontal abd x 5 SL ER with 1 # x 10, alphabet 1 # A-Z, supine punches x 10 0# Supine horizontal abd with yellow x 10 Alternating isometrics and rhythmic stabs 2 x 30 sec  PATIENT EDUCATION:  Access Code: [email or web address] URL: [email or web address] Date: 01/06/2022 Prepared by: Cheral Almas  Exercises - Supine Shoulder Alphabet  - 1 x daily - 7 x weekly - Supine Single Arm Shoulder Protraction  - 1 x daily - 7 x weekly - 10 reps  Education details: theraband exs SR, IR and ER with yellow Person educated: Patient Education method: Explanation, Demonstration, Tactile cues, Verbal cues, and Handouts Education comprehension: verbalized understanding, returned demonstration, verbal cues required, and needs further education    HOME EXERCISE PROGRAM: Access Code: [email or web address] URL: [email or web address] Date: 08/28/2021 Prepared by: Shan Levans  Exercises - Serratus Forearm Push Up Plus at Wall with Elbows - 1 x daily - 3 x weekly - 1 sets - 10 reps - no hold - Standing Shoulder Row with Anchored Resistance - 1 x daily - 3 x weekly - 1-3 sets - 10 reps - 3sec hold - Shoulder extension with resistance - Neutral - 1 x daily - 3 x weekly - 1-3 sets - 10 reps - 3sec hold - Standing Shoulder External Rotation with Resistance - 1 x daily - 3 x weekly - 1-3 sets - 10 reps - 3sec  hold - Single Arm Doorway Pec Stretch at 90 Degrees Abduction - 1-2 x daily - 7 x weekly - 2 reps - 30-60sec hold  ASSESSMENT:   CLINICAL IMPRESSION: Pt has missed 3 weeks of therapy due to a bad ankle sprain.  Due to her ankle injury she has not gone to see an orthopedic MD yet for her shoulder.  Over the last 3 weeks she has developed increased stiffness and pain consistent with a frozen shoulder, in addition to the impingement type pain she was already having. She did loosen up some after manual therapy, and was able to perform AAROM exs with the wand with stretch discomfort, but has significant limitations in all ROM at present.  She did make an appt this am to seen an [facility] in April. She will benefit from skilled PT to continue to address  muscular tightness and deficits in shoulder ROM before her appt with MD in April. Dry needling will be added to the plan as well, and as ROM improves will address strength. OBJECTIVE IMPAIRMENTS decreased knowledge of use of DME, decreased ROM, and increased edema.    ACTIVITY LIMITATIONS community activity and yard work.    PERSONAL FACTORS 1 comorbidity: hematoma complication  are also affecting patient's functional outcome.      REHAB POTENTIAL: Excellent   CLINICAL DECISION MAKING: Stable/uncomplicated   EVALUATION COMPLEXITY: Low  GOALS: Goals reviewed with patient? Yes     LONG TERM GOALS: Target date: 05/23/2022   Pt will improve Lt UE AROM to equal to the Rt UE and without pain Baseline:  Goal status: ONGOING   2.  Pt will be ind with self care regarding Lt breast hematoma including self massage, compression bra, and use of foam or swell spot Baseline:  Goal status: MET - pt reports it has softened but it is still there  3. Pt will not demonstrate shooting breast  pains to allow improved comfort.  Baseline:  Goals status: MET if not sleeping on left side  4. Pt will be independent in HEP for left shoulder ROM and in particular strengthening             GOAL STATUS : progressing 5. Pt will improve shoulder flexion and abduction to atleast 150 degrees for improved reaching  Goal status   NEW 6. Pt will have decreased left shoulder pain  no greater than 2/10  Goal Status: NEW PLAN: PT FREQUENCY: 1x/week   PT DURATION: 8 weeks    PLANNED INTERVENTIONS: Therapeutic exercises, Patient/Family education, Joint mobilization, DME instructions, Manual lymph drainage, Compression bandaging, scar mobilization, Taping, and Manual therapy, iontophoresis with dexamethasone*   PLAN FOR NEXT SESSION:  (pt now with symptoms of Frozen shoulder),STM, scapular and GH mob, working on increase A/PROM and decreasing shoulder pain with overhead reach; PROM,MLD lateral trunk prn, review theraband exs. and progress strength to prevent posterior impingement, MLD lateral trunk/hematoma area prn., ABC exercises?(Seeing Dr. Griffin Basil on 07/11/2022)       Claris Pong, PT 06/06/2022, 1:05 PM

## 2022-07-28 ENCOUNTER — Other Ambulatory Visit: Payer: 59

## 2022-07-28 ENCOUNTER — Ambulatory Visit: Payer: 59 | Admitting: Hematology

## 2022-08-01 ENCOUNTER — Ambulatory Visit: Payer: 59 | Attending: Radiation Oncology

## 2022-08-01 DIAGNOSIS — M25612 Stiffness of left shoulder, not elsewhere classified: Secondary | ICD-10-CM | POA: Insufficient documentation

## 2022-08-01 DIAGNOSIS — D0512 Intraductal carcinoma in situ of left breast: Secondary | ICD-10-CM | POA: Diagnosis present

## 2022-08-01 DIAGNOSIS — R252 Cramp and spasm: Secondary | ICD-10-CM | POA: Diagnosis present

## 2022-08-01 DIAGNOSIS — M7981 Nontraumatic hematoma of soft tissue: Secondary | ICD-10-CM | POA: Insufficient documentation

## 2022-08-01 DIAGNOSIS — N6489 Other specified disorders of breast: Secondary | ICD-10-CM | POA: Diagnosis present

## 2022-08-01 NOTE — Therapy (Signed)
OUTPATIENT PHYSICAL THERAPY TREATMENT NOTE   Patient Name: Beth Stephens MRN: [id number] DOB:05-22-1975, 47 y.o., female Today's Date: 08/01/2022  PCP: Irena Reichmann, DO REFERRING PROVIDER: Lonie Peak, MD  END OF SESSION:   PT End of Session - 08/01/22 0905     Visit Number 27    Number of Visits 43    Date for PT Re-Evaluation 09/26/22    Authorization Type [employer]    Authorization - Visit Number 9    Authorization - Number of Visits 60    PT Start Time 0907    PT Stop Time 1000    PT Time Calculation (min) 53 min    Activity Tolerance Patient tolerated treatment well    Behavior During Therapy WFL for tasks assessed/performed                   Past Medical History:  Diagnosis Date   Cancer ([facility])    Breast Cancer   Colitis    Colitis    Past Surgical History:  Procedure Laterality Date   BREAST LUMPECTOMY WITH RADIOACTIVE SEED LOCALIZATION Left 05/01/2021   Procedure: LEFT BREAST LUMPECTOMY WITH RADIOACTIVE SEED LOCALIZATION;  Surgeon: Almond Lint, MD;  Location: [facility];  Service: General;  Laterality: Left;   COLONOSCOPY     WISDOM TOOTH EXTRACTION     Patient Active Problem List   Diagnosis Date Noted   Cancer of left breast ([facility]) 11/25/2021   Iron deficiency anemia due to chronic blood loss 05/24/2021   Genetic testing 04/22/2021   Ductal carcinoma in situ (DCIS) of left breast 04/08/2021    REFERRING DIAG: DCIS Lt breast  THERAPY DIAG:  Stiffness of left shoulder, not elsewhere classified  Breast edema  Nontraumatic hematoma of soft tissue  Ductal carcinoma in situ (DCIS) of left breast  SUBJECTIVE:  I saw Dr. Everardo Pacific for my shoulder. He gave me the option of MRI or  an injection.  I took the injection. He also  said he can manipulate it for me but I am trying to work on my ROM at home. I am not getting the sharp pain I was getting. No real pain  with general use of arm, but get achy with stretching. I can reach to my back pocket  only.   PERTINENT HISTORY: Left lumpectomy 05/01/21 no SLNB with Dr. Donell Beers.  Large hematoma formation. Radiation completed  PRECAUTIONS: Other: very small lymphedema risk Lt UE and breast due to radiation    Are you having pain? yes-  it is  0/10 at rest in shoulder today  but when I stretch it gets to a 5/10 Pain location: anterior/posterior shoulder Pain description: sharp  Aggravating factors: sleeping on it, reaching overhead, fastening bra now in front because can't do behind Relieving factors: rest   OBJECTIVE:  PALPATION: Very hard mandarin orange size hematoma under lateral breast incision, +2 ttp axillary palpation with some puffiness here.  Overall breast not swollen.   12/23/2021 Hematoma is softer with less defined borders, pt is very tender today at lateral lower ribcage   OBSERVATIONS / OTHER ASSESSMENTS: 07/22/21: radiated field still red and healing especially inferior to breast, and peeling blister area in the axilla.        07/02/21: healing hematoma Lt lateral breast, puffiness Lt axilla, wearing soft cotton eye clasp front bra which has no compression but pt reports is comfortable and the only fabric that is okay to wear currently. 11/04/21- hematoma still firm and palpable in left lateral breast,  educated pt to try and sleep in her compression bra 08/01/2022; Significant UT compensation with AROM Left shoulder  in standing with poor scapular mobility   UPPER EXTREMITY AROM/PROM:   A/PROM RIGHT  07/02/2021    Shoulder extension 53  Shoulder flexion 174  Shoulder abduction 165  Shoulder internal rotation Behind the back to T6  Shoulder external rotation 90                          (Blank rows = not tested)   A/PROM LEFT  07/02/2021 07/22/21 08/13/21 08/28/21 11/04/21 12/23/2021 02/28/2022 06/06/2022 08/01/2022  Shoulder extension 54    66 60 60 40 32  Shoulder flexion  145 - ache top of shoulde 145 - ache top of shoulde 155 - with top of the shoulder pinch 155 - no pinch  151 - feels  pain coming on 155 pulls at rib 159 95 pinch 82  Shoulder abduction 160 155 - ache top of the shoulder  155 - ache top of the shoulder  167- no pinch 150 - feels pain coming on 150 with pinch at end range 140 Fear of pinch 72pain 73  Shoulder internal rotation Behind the back to T4     T8 with pinch  Left buttocks Left buttocks  Shoulder external rotation 90     94 with pinch at endrange   30 supine @ 45                          (Blank rows = not tested)    06/06/2022 PROM left shoulder Flex 115, IR at 45 degrees abd 63, ER at 45 deg 37   TODAY'S TREATMENT   08/01/2022 Measured left shoulder AROM Pt in supine for soft tissue mobilization to left UT, Lats, pectorals, scapular area with cocoa butter. Cupping to all borders of scapular region. Pts skin much redder than usual with cupping today, but still resolved quickly Gr 3/4 GH mobs post and inferior Supine wand flexion, scaption, ER x 5-7 reps Educated in posterior capsule stretch and IR stretch behind back Updated HEP  06/06/2022 Measured left shoulder A/PROM Pt in supine for soft tissue mobilization to left UT, Lats, pectorals, scapular area with cocoa butter. Cupping to all borders of scapular region. PROM left shoulder flexion, IR and ER with VC's to pt to relax Supine AA shoulder flexion with clasped hands, switched to wand exs. VC's to depress scapula and to avoid pinch. Updated pts HEP.   PATIENT EDUCATION:  Access Code: [email or web address] URL: [email or web address] Date: 01/06/2022 Prepared by: Alvira Monday  Exercises - Supine Shoulder Alphabet  - 1 x daily - 7 x weekly - Supine Single Arm Shoulder Protraction  - 1 x daily - 7 x weekly - 10 reps  Education details: theraband exs SR, IR and ER with yellow Person educated: Patient Education method: Explanation, Demonstration, Tactile cues, Verbal cues, and Handouts Education comprehension: verbalized understanding, returned demonstration, verbal cues required, and  needs further education    HOME EXERCISE PROGRAM: Access Code: [email or web address] URL: [email or web address] Date: 08/28/2021 Prepared by: Gwenevere Abbot  Exercises - Serratus Forearm Push Up Plus at Wall with Elbows - 1 x daily - 3 x weekly - 1 sets - 10 reps - no hold - Standing Shoulder Row with Anchored Resistance - 1 x daily - 3 x weekly - 1-3 sets - 10 reps - 3sec hold - Shoulder extension with resistance - Neutral -  1 x daily - 3 x weekly - 1-3 sets - 10 reps - 3sec hold - Standing Shoulder External Rotation with Resistance - 1 x daily - 3 x weekly - 1-3 sets - 10 reps - 3sec hold - Single Arm Doorway Pec Stretch at 90 Degrees Abduction - 1-2 x daily - 7 x weekly - 2 reps - 30-60sec hold  ASSESSMENT:   CLINICAL IMPRESSION: Pt  missed 3 weeks of therapy between Feb and March due to a bad ankle sprain..  Over those 3 weeks she developed increased stiffness and pain consistent with a frozen shoulder, in addition to the impingement type pain she was already having. At my urging she did have an apt with Dr. Everardo Pacific to check her shoulder. HE offered  her an MRI vs injection. She opted for an injection and she returns today for therapy now 8 weeks from her last visit on 06/06/2022. Pain is improved but ROM continues to be considerably decreased with decreased capsular, GH and scapular mobility. STM and cupping was performed to try and improve mobility, in addition to [facility] mobilizations and education in HEP.  She had improved tolerance for supine exercises and did visibly improve, with stretch discomfort only. She was educated to try to reposition her shoulder if feeling sharp pain. She will benefit from skilled therapy including but not limited to  Dry needling,GH and scapular mobility, ROM and strengthening to address deficits  and return pt to her PLOF.Marland Kitchen    OBJECTIVE IMPAIRMENTS decreased knowledge of use of DME, decreased ROM, Decreased strength and increased edema.    ACTIVITY LIMITATIONS  community activity and yard work. , overhead reaching activities   PERSONAL FACTORS 1 comorbidity: hematoma complication  are also affecting patient's functional outcome.      REHAB POTENTIAL: Excellent   CLINICAL DECISION MAKING: Stable/uncomplicated   EVALUATION COMPLEXITY: Low  GOALS: Goals reviewed with patient? Yes     LONG TERM GOALS: Target date: 05/23/2022/   carry over and revised prn   09/26/2022   Pt will improve Lt UE AROM to equal to the Rt UE and without pain. Goal revised : ROM within 10 degrees of Right UE for improved reaching Baseline:  Goal status: ONGOING   2.  Pt will be ind with self care regarding Lt breast hematoma including self massage, compression bra, and use of foam or swell spot Baseline:  Goal status: MET - pt reports it has softened but it is still there  3. Pt will not demonstrate shooting breast  pains to allow improved comfort.  Baseline:  Goals status: MET if not sleeping on left side  4. Pt will be independent in HEP for left shoulder ROM and in particular strengthening             GOAL STATUS : Ongoing  5. Pt will improve shoulder flexion and abduction to atleast 120 degrees for improved reaching  Goal status   Revised from 150 (08/01/22)  6. Pt will have decreased left shoulder pain no greater than 2/10  7. Pt will be able to fasten bra behind back  Goal Status: New  PLAN: PT FREQUENCY: 2x/week   PT DURATION: 8 weeks    PLANNED INTERVENTIONS: Therapeutic exercises, Patient/Family education, Joint mobilization, DME instructions, Manual lymph drainage, Compression bandaging, scar mobilization, Dry Needling Taping, and Manual therapy, iontophoresis with dexamethasone*   PLAN FOR NEXT SESSION:  (pt now with symptoms of Left Frozen shoulder, (RECENT INJECTION 07/11/22), Dry Needling,STM/cupping, scapular and GH mob, working  on increase A/PROM and decreasing shoulder pain with overhead reach; PROM,MLD lateral trunk prn, progress  to strength  as ROM improves. 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 We are co-treating CA and ortho with same goals.Waynette Buttery, PT 08/01/2022, 5:10 PM

## 2022-08-06 ENCOUNTER — Ambulatory Visit: Payer: 59

## 2022-08-06 DIAGNOSIS — M25612 Stiffness of left shoulder, not elsewhere classified: Secondary | ICD-10-CM | POA: Diagnosis not present

## 2022-08-06 DIAGNOSIS — N6489 Other specified disorders of breast: Secondary | ICD-10-CM

## 2022-08-06 DIAGNOSIS — M7981 Nontraumatic hematoma of soft tissue: Secondary | ICD-10-CM

## 2022-08-06 DIAGNOSIS — D0512 Intraductal carcinoma in situ of left breast: Secondary | ICD-10-CM

## 2022-08-06 NOTE — Therapy (Signed)
OUTPATIENT PHYSICAL THERAPY TREATMENT NOTE   Patient Name: Beth Stephens MRN: [id number] DOB:1975-11-12, 47 y.o., female Today's Date: 08/06/2022  PCP: Irena Reichmann, DO REFERRING PROVIDER: Lonie Peak, MD  END OF SESSION:   PT End of Session - 08/06/22 0805     Visit Number 28    Number of Visits 43    Date for PT Re-Evaluation 09/26/22    Authorization Type [employer]    Authorization - Visit Number 10    PT Start Time 0806    PT Stop Time 0854    PT Time Calculation (min) 48 min    Activity Tolerance Patient tolerated treatment well    Behavior During Therapy [facility] for tasks assessed/performed                   Past Medical History:  Diagnosis Date   Cancer (HCC)    Breast Cancer   Colitis    Colitis    Past Surgical History:  Procedure Laterality Date   BREAST LUMPECTOMY WITH RADIOACTIVE SEED LOCALIZATION Left 05/01/2021   Procedure: LEFT BREAST LUMPECTOMY WITH RADIOACTIVE SEED LOCALIZATION;  Surgeon: Almond Lint, MD;  Location: [facility] OR;  Service: General;  Laterality: Left;   COLONOSCOPY     WISDOM TOOTH EXTRACTION     Patient Active Problem List   Diagnosis Date Noted   Cancer of left breast (HCC) 11/25/2021   Iron deficiency anemia due to chronic blood loss 05/24/2021   Genetic testing 04/22/2021   Ductal carcinoma in situ (DCIS) of left breast 04/08/2021    REFERRING DIAG: DCIS Lt breast  THERAPY DIAG:  Stiffness of left shoulder, not elsewhere classified  Breast edema  Nontraumatic hematoma of soft tissue  Ductal carcinoma in situ (DCIS) of left breast  SUBJECTIVE: I think my shoulder is doing better. I have been exercising 2x/day  PERTINENT HISTORY: Left lumpectomy 05/01/21 no SLNB with Dr. Donell Beers.  Large hematoma formation. Radiation completed  PRECAUTIONS: Other: very small lymphedema risk Lt UE and breast due to radiation    Are you having pain? yes-  it is  0/10 at rest in shoulder today  but when I stretch it gets to a 3-4/10 Pain  location: anterior/posterior shoulder Pain description: sharp  Aggravating factors: sleeping on it, reaching overhead, fastening bra now in front because can't do behind Relieving factors: rest   OBJECTIVE:  PALPATION: Very hard mandarin orange size hematoma under lateral breast incision, +2 ttp axillary palpation with some puffiness here.  Overall breast not swollen.   12/23/2021 Hematoma is softer with less defined borders, pt is very tender today at lateral lower ribcage   OBSERVATIONS / OTHER ASSESSMENTS: 07/22/21: radiated field still red and healing especially inferior to breast, and peeling blister area in the axilla.        07/02/21: healing hematoma Lt lateral breast, puffiness Lt axilla, wearing soft cotton eye clasp front bra which has no compression but pt reports is comfortable and the only fabric that is okay to wear currently. 11/04/21- hematoma still firm and palpable in left lateral breast, educated pt to try and sleep in her compression bra 08/01/2022; Significant UT compensation with AROM Left shoulder  in standing with poor scapular mobility   UPPER EXTREMITY AROM/PROM:   A/PROM RIGHT  07/02/2021    Shoulder extension 53  Shoulder flexion 174  Shoulder abduction 165  Shoulder internal rotation Behind the back to T6  Shoulder external rotation 90                          (  Blank rows = not tested)   A/PROM LEFT  07/02/2021 07/22/21 08/13/21 08/28/21 11/04/21 12/23/2021 02/28/2022 06/06/2022 08/01/2022  Shoulder extension 54    66 60 60 40 32  Shoulder flexion  145 - ache top of shoulde 145 - ache top of shoulde 155 - with top of the shoulder pinch 155 - no pinch  151 - feels pain coming on 155 pulls at rib 159 95 pinch 82  Shoulder abduction 160 155 - ache top of the shoulder  155 - ache top of the shoulder  167- no pinch 150 - feels pain coming on 150 with pinch at end range 140 Fear of pinch 72pain 73  Shoulder internal rotation Behind the back to T4     T8 with pinch  Left buttocks Left  buttocks  Shoulder external rotation 90     94 with pinch at endrange   30 supine @ 45                          (Blank rows = not tested)    06/06/2022 PROM left shoulder Flex 115, IR at 45 degrees abd 63, ER at 45 deg 37   TODAY'S TREATMENT   08/05/2021 Gr 3/4 GH mobs post and inferior Pt in supine for soft tissue mobilization to left UT, Lats, pectorals, scapular area with cocoa butter. Cupping to all borders of scapular region Supine wand flexion, scaption, ER x 5-7 reps PROM left shoulder flexion, scaption, abd, IR and ER 08/01/2022 Measured left shoulder AROM Pt in supine for soft tissue mobilization to left UT, Lats, pectorals, scapular area with cocoa butter. Cupping to all borders of scapular region. Pts skin much redder than usual with cupping today, but still resolved quickly Gr 3/4 GH mobs post and inferior Supine wand flexion, scaption, ER x 5-7 reps Educated in posterior capsule stretch and IR stretch behind back Updated HEP  06/06/2022 Measured left shoulder A/PROM Pt in supine for soft tissue mobilization to left UT, Lats, pectorals, scapular area with cocoa butter. Cupping to all borders of scapular region. PROM left shoulder flexion, IR and ER with VC's to pt to relax Supine AA shoulder flexion with clasped hands, switched to wand exs. VC's to depress scapula and to avoid pinch. Updated pts HEP.   PATIENT EDUCATION:  Access Code: [email or web address] URL: [email or web address] Date: 01/06/2022 Prepared by: Alvira Monday  Exercises - Supine Shoulder Alphabet  - 1 x daily - 7 x weekly - Supine Single Arm Shoulder Protraction  - 1 x daily - 7 x weekly - 10 reps  Education details: theraband exs SR, IR and ER with yellow Person educated: Patient Education method: Explanation, Demonstration, Tactile cues, Verbal cues, and Handouts Education comprehension: verbalized understanding, returned demonstration, verbal cues required, and needs further education    HOME  EXERCISE PROGRAM: Access Code: [email or web address] URL: [email or web address] Date: 08/28/2021 Prepared by: Gwenevere Abbot  Exercises - Serratus Forearm Push Up Plus at Wall with Elbows - 1 x daily - 3 x weekly - 1 sets - 10 reps - no hold - Standing Shoulder Row with Anchored Resistance - 1 x daily - 3 x weekly - 1-3 sets - 10 reps - 3sec hold - Shoulder extension with resistance - Neutral - 1 x daily - 3 x weekly - 1-3 sets - 10 reps - 3sec hold - Standing Shoulder External Rotation with Resistance - 1 x daily - 3 x weekly - 1-3 sets - 10 reps - 3sec  hold - Single Arm Doorway Pec Stretch at 90 Degrees Abduction - 1-2 x daily - 7 x weekly - 2 reps - 30-60sec hold  ASSESSMENT:   CLINICAL IMPRESSION:  Pt is tolerating her exercises and stretching better and demonstrates improved AA and PROM today.   OBJECTIVE IMPAIRMENTS decreased knowledge of use of DME, decreased ROM, Decreased strength and increased edema.    ACTIVITY LIMITATIONS community activity and yard work. , overhead reaching activities   PERSONAL FACTORS 1 comorbidity: hematoma complication  are also affecting patient's functional outcome.      REHAB POTENTIAL: Excellent   CLINICAL DECISION MAKING: Stable/uncomplicated   EVALUATION COMPLEXITY: Low  GOALS: Goals reviewed with patient? Yes     LONG TERM GOALS: Target date: 05/23/2022/   carry over and revised prn   09/26/2022   Pt will improve Lt UE AROM to equal to the Rt UE and without pain. Goal revised : ROM within 10 degrees of Right UE for improved reaching Baseline:  Goal status: ONGOING   2.  Pt will be ind with self care regarding Lt breast hematoma including self massage, compression bra, and use of foam or swell spot Baseline:  Goal status: MET - pt reports it has softened but it is still there  3. Pt will not demonstrate shooting breast  pains to allow improved comfort.  Baseline:  Goals status: MET if not sleeping on left side  4. Pt will be  independent in HEP for left shoulder ROM and in particular strengthening             GOAL STATUS : Ongoing  5. Pt will improve shoulder flexion and abduction to atleast 120 degrees for improved reaching  Goal status   Revised from 150 (08/01/22)  6. Pt will have decreased left shoulder pain no greater than 2/10  7. Pt will be able to fasten bra behind back  Goal Status: New  PLAN: PT FREQUENCY: 2x/week   PT DURATION: 8 weeks    PLANNED INTERVENTIONS: Therapeutic exercises, Patient/Family education, Joint mobilization, DME instructions, Manual lymph drainage, Compression bandaging, scar mobilization, Dry Needling Taping, and Manual therapy, iontophoresis with dexamethasone*   PLAN FOR NEXT SESSION:  (pt now with symptoms of Left Frozen shoulder, (RECENT INJECTION 07/11/22), Dry Needling,STM/cupping, scapular and GH mob, working on increase A/PROM and decreasing shoulder pain with overhead reach; PROM,MLD lateral trunk prn, progress  to strength as ROM improves. 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 We are co-treating CA and ortho with same goals.Waynette Buttery, PT 08/06/2022, 8:56 AM

## 2022-08-07 ENCOUNTER — Other Ambulatory Visit: Payer: Self-pay

## 2022-08-07 DIAGNOSIS — E559 Vitamin D deficiency, unspecified: Secondary | ICD-10-CM

## 2022-08-07 DIAGNOSIS — D0512 Intraductal carcinoma in situ of left breast: Secondary | ICD-10-CM

## 2022-08-07 NOTE — Progress Notes (Signed)
[facility]   Telephone:([phone] [phone]   Clinic Follow up Note   Patient Care Team: Irena Reichmann, DO as PCP - General (Family Medicine) Pershing Proud, RN as Oncology Nurse Navigator Donnelly Angelica, RN as Oncology Nurse Navigator Kerry Dory, MD as Referring Physician (Hematology) Almond Lint, MD as Consulting Physician (General Surgery) Lonie Peak, MD as Attending Physician (Radiation Oncology) Pollyann Samples, NP as Nurse Practitioner (Nurse Practitioner) Malachy Mood, MD as Consulting Physician (Hematology)  Date of Service:  08/08/2022  CHIEF COMPLAINT: f/u of left breast DCIS  CURRENT THERAPY:  Tamoxifen, started 07/2021, currently 5 mg every other day due to poor tolerance   ASSESSMENT & PLAN:  Beth Stephens is a 47 y.o. pre-menopausal female with   1. Left breast DCIS, grade 2, ER+/PR+ -found on screening mammogram. S/p left lumpectomy on 05/01/21 by Dr. Donell Beers showed 1 cm DCIS, margins negative.  -Postop course was complicated by a large hematoma requiring drainage. -s/p radiation 06/24/21 - 07/15/21 under Dr. Basilio Cairo. -she started tamoxifen 10mg  in 07/2021 but has not tolerated well with bothersome hot flashes and palpitation. She has reduced to 5mg  3-4 time a week.  Due to hot flash and insomnia from tamoxifen, even with further dose reduction, she has stopped tamoxifen a few months ago. -She is still perimenopausal, not a candidate for anastrozole.  We discussed option of raloxifene, she is willing to try, she will start at a half dose. -Continue breast cancer surveillance, with annual mammogram and breast MRI, 6 months apart.   2.  history of symptomatic anemia from bleeding and mild iron deficiency, dyspnea on exertion  -she developed symptomatic anemia with dyspnea on exertion following surgery, hgb 10.5. -she started oral iron in early 05/2021 and was given IV Venofer on 05/24/21 and 05/29/21 with significant improvement and anemia  resolved. -She has mild anemia again today, probably related to her menstrual period, she will restart oral iron.   3.  Postsurgical bleeding, ruled out bleeding disorder -She has had easy bruising for a long time. She developed a large hematoma after left breast lumpectomy, required multiple draining -work up from 06/21/21 showed normal PT, APTT, and platelet count.  Platelet function assay and aggregation were normal, Von willebrand panel was normal. Additional labs from 08/12/21 showed Factor 8, 9, 11 and 13 all normal -she was referred to [facility] Dr. Marcheta Grammes for further evaluation, lab work up was negative.    4.  Vitamin D deficiency -I reviewed her high-dose vitamin D today.   PLAN:  - reviewed labs added a vitamin D level - f/u in 6 months with labs - called in raloxifene 60 mg daily, she will start at half dose.   SUMMARY OF ONCOLOGIC HISTORY: Oncology History Overview Note   Cancer Staging  Ductal carcinoma in situ (DCIS) of left breast Staging form: Breast, AJCC 8th Edition - Clinical stage from 04/02/2021: Stage 0 (cTis (DCIS), cN0, cM0, G3, ER+, PR+, HER2: Not Assessed) - Signed by Malachy Mood, MD on 04/09/2021 - Pathologic stage from 05/01/2021: Stage Unknown (pTis (DCIS), pNX, cM0, G2, ER+, PR+, HER2: Not Assessed) - Signed by Malachy Mood, MD on 05/23/2021    Ductal carcinoma in situ (DCIS) of left breast  01/18/2021 Mammogram   Exam: 3D Mammogram Diagnostic Digital - L  IMPRESSION: The 0.3 cm grouped round calcifications in the left breast are suspicious.   04/02/2021 Initial Biopsy   Diagnosis 1. Breast, left, needle core biopsy, left breast calcification @ 3:00  6 cmfn  - DUCTAL CARCINOMA IN SITU WITH NECROSIS AND CALCIFICATIONS  - SEE COMMENT Microscopic Comment Immunohistochemistry was performed on the biopsy to assess for an invasive component (SMM, calponin, CD34 and p63). Myoepithelial cells are present supporting the diagnosis of ductal carcinoma in situ. Based on the biopsy,  the ductal carcinoma in situ cribriform and solid patterns, intermediate nuclear grade, and measures 0.6 cm in greatest linear extent.  1. PROGNOSTIC INDICATORS Results: Estrogen Receptor: 100%, POSITIVE, STRONG STAINING INTENSITY Progesterone Receptor: 70%, POSITIVE, STRONG STAINING INTENSITY   04/02/2021 Cancer Staging   Staging form: Breast, AJCC 8th Edition - Clinical stage from 04/02/2021: Stage 0 (cTis (DCIS), cN0, cM0, G3, ER+, PR+, HER2: Not Assessed) - Signed by Malachy Mood, MD on 04/09/2021 Stage prefix: Initial diagnosis Histologic grading system: 3 grade system   04/08/2021 Initial Diagnosis   Ductal carcinoma in situ (DCIS) of left breast    Genetic Testing   [facility] CancerNext-Expanded Panel was Negative. Report date is 04/20/2021.  The CancerNext-Expanded gene panel offered by [facility] and includes sequencing, rearrangement, and RNA analysis for the following 77 genes: AIP, ALK, APC, ATM, AXIN2, BAP1, BARD1, BLM, BMPR1A, BRCA1, BRCA2, BRIP1, CDC73, CDH1, CDK4, CDKN1B, CDKN2A, CHEK2, CTNNA1, DICER1, FANCC, FH, FLCN, GALNT12, KIF1B, LZTR1, MAX, MEN1, MET, MLH1, MSH2, MSH3, MSH6, MUTYH, NBN, NF1, NF2, NTHL1, PALB2, PHOX2B, PMS2, POT1, PRKAR1A, PTCH1, PTEN, RAD51C, RAD51D, RB1, RECQL, RET, SDHA, SDHAF2, SDHB, SDHC, SDHD, SMAD4, SMARCA4, SMARCB1, SMARCE1, STK11, SUFU, TMEM127, TP53, TSC1, TSC2, VHL and XRCC2 (sequencing and deletion/duplication); EGFR, EGLN1, HOXB13, KIT, MITF, PDGFRA, POLD1, and POLE (sequencing only); EPCAM and GREM1 (deletion/duplication only).    04/26/2021 Imaging   EXAM: BILATERAL BREAST MRI WITH AND WITHOUT CONTRAST  IMPRESSION: 1. No significant enhancement at the site of known ductal carcinoma in situ in the LATERAL portion of the LEFT breast. There is minimal enhancement along the biopsy tract in this region. 2. 0.8 centimeter indeterminate enhancing mass in the posterior LOWER INNER QUADRANT of the LEFT breast. 3. RIGHT breast is negative.   05/01/2021  Cancer Staging   Staging form: Breast, AJCC 8th Edition - Pathologic stage from 05/01/2021: Stage Unknown (pTis (DCIS), pNX, cM0, G2, ER+, PR+, HER2: Not Assessed) - Signed by Malachy Mood, MD on 05/23/2021 Stage prefix: Initial diagnosis Histologic grading system: 3 grade system Residual tumor (R): R0 - None   05/01/2021 Pathology Results   FINAL MICROSCOPIC DIAGNOSIS:   A. BREAST, LEFT MEDIAL @ 7:30, EXCISION:  - Fibrocystic changes with calcifications.  - No malignancy identified.   B. BREAST, LEFT @ 3;00, LUMPECTOMY:  - Ductal carcinoma in situ, 1 cm.  - All surgical margins negative for DCIS.  - Fibrocystic changes with diffuse sclerosing adenosis and  calcifications.  - Biopsy site and biopsy clip.  - See oncology table.    06/24/2021 - 07/15/2021 Radiation Therapy   Completed 42.56 Wallace Cullens in 16 fractions to the left breast by Dr. Basilio Cairo   07/2021 -  Anti-estrogen oral therapy   Began adjuvant tamoxifen 10 mg p.o. once daily, goal 3-5 years   11/25/2021 Survivorship   SCP visit with Santiago Glad, NP      INTERVAL HISTORY:  Beth Stephens is here for a follow up of DCIS. She was last seen by me on 01/28/2023. She presents to clinic alone.  Having issues with sleep, heart racing, and flushing caused by the Tamoxifen, and she has stopped tamoxifen a few months ago.  She states she has a low tolerance to medications.  She is otherwise doing well, no other new complaints.   All other systems were reviewed with the patient and are negative.  MEDICAL HISTORY:  Past Medical History:  Diagnosis Date   Cancer (HCC)    Breast Cancer   Colitis    Colitis     SURGICAL HISTORY: Past Surgical History:  Procedure Laterality Date   BREAST LUMPECTOMY WITH RADIOACTIVE SEED LOCALIZATION Left 05/01/2021   Procedure: LEFT BREAST LUMPECTOMY WITH RADIOACTIVE SEED LOCALIZATION;  Surgeon: Almond Lint, MD;  Location: [facility] OR;  Service: General;  Laterality: Left;   COLONOSCOPY     WISDOM TOOTH  EXTRACTION      I have reviewed the social history and family history with the patient and they are unchanged from previous note.  ALLERGIES:  is allergic to gluten meal and sumatriptan.  MEDICATIONS:  Current Outpatient Medications  Medication Sig Dispense Refill   raloxifene (EVISTA) 60 MG tablet Take 1 tablet (60 mg total) by mouth daily. Start at 1/2 tab daily and increase to 1 tab daily after a month if tolerates 30 tablet 2   ergocalciferol (VITAMIN D2) 1.25 MG (50000 UT) capsule Take 1 capsule (50,000 Units total) by mouth once a week. 12 capsule 0   sulfaSALAzine (AZULFIDINE) 500 MG tablet Take 1,000 mg by mouth See admin instructions. Take 2 tablets (1000 mg) by mouth daily after lunch, may take 2 tablets (1000 mg) two more times in the day as needed for colitis flare     No current facility-administered medications for this visit.    PHYSICAL EXAMINATION: ECOG PERFORMANCE STATUS: 0 - Asymptomatic  Vitals:   08/08/22 1054  BP: 109/69  Pulse: 69  Resp: 18  Temp: 98.2 F (36.8 C)  SpO2: 100%    Wt Readings from Last 3 Encounters:  08/08/22 120 lb 12.8 oz (54.8 kg)  01/27/22 121 lb 9.6 oz (55.2 kg)  11/25/21 121 lb 3.2 oz (55 kg)     GENERAL:alert, no distress and comfortable SKIN: skin color, texture, turgor are normal, no rashes or significant lesions EYES: normal, Conjunctiva are pink and non-injected, sclera clear  NECK: supple, thyroid normal size, non-tender, without nodularity LYMPH:  no palpable lymphadenopathy in the cervical, axillary LUNGS: clear to auscultation and percussion with normal breathing effort HEART: regular rate & rhythm and no murmurs and no lower extremity edema ABDOMEN:abdomen soft, non-tender and normal bowel sounds Musculoskeletal:no cyanosis of digits and no clubbing  NEURO: alert & oriented x 3 with fluent speech, no focal motor/sensory deficits BREAST:  No palpable mass, nodules or adenopathy bilaterally. Breast exam benign.    LABORATORY DATA:  I have reviewed the data as listed    Latest Ref Rng & Units 08/08/2022   10:10 AM 01/27/2022   10:17 AM 08/12/2021    3:51 PM  CBC  WBC 4.0 - 10.5 K/uL 7.8  7.6  8.4   Hemoglobin 12.0 - 15.0 g/dL 29.5  62.1  30.8   Hematocrit 36.0 - 46.0 % 37.3  38.6  39.4   Platelets 150 - 400 K/uL 186  174  164         Latest Ref Rng & Units 08/08/2022   10:10 AM 01/27/2022   10:17 AM 06/21/2021   10:27 AM  CMP  Glucose 70 - 99 mg/dL 657  83  85   BUN 6 - 20 mg/dL 11  12  10    Creatinine 0.44 - 1.00 mg/dL 8.46  9.62  9.52   Sodium 135 - 145  mmol/L 138  143  139   Potassium 3.5 - 5.1 mmol/L 4.0  3.7  4.2   Chloride 98 - 111 mmol/L 107  108  107   CO2 22 - 32 mmol/L 25  30  26    Calcium 8.9 - 10.3 mg/dL 8.6  9.4  9.2   Total Protein 6.5 - 8.1 g/dL 6.9  7.6  7.0   Total Bilirubin 0.3 - 1.2 mg/dL 0.5  0.5  0.4   Alkaline Phos 38 - 126 U/L 56  57  59   AST 15 - 41 U/L 16  19  18    ALT 0 - 44 U/L 13  14  17        RADIOGRAPHIC STUDIES: I have personally reviewed the radiological images as listed and agreed with the findings in the report. No results found.    Orders Placed This Encounter  Procedures   Vitamin D 25 hydroxy    Standing Status:   Future    Number of Occurrences:   1    Standing Expiration Date:   08/08/2023   All questions were answered. The patient knows to call the clinic with any problems, questions or concerns. No barriers to learning was detected. The total time spent in the appointment was 20 minutes.     Malachy Mood, MD 08/08/2022

## 2022-08-08 ENCOUNTER — Encounter: Payer: Self-pay | Admitting: Hematology

## 2022-08-08 ENCOUNTER — Inpatient Hospital Stay: Payer: 59 | Attending: Hematology

## 2022-08-08 ENCOUNTER — Other Ambulatory Visit: Payer: Self-pay | Admitting: *Deleted

## 2022-08-08 ENCOUNTER — Inpatient Hospital Stay: Payer: 59 | Admitting: Hematology

## 2022-08-08 ENCOUNTER — Other Ambulatory Visit: Payer: Self-pay

## 2022-08-08 VITALS — BP 109/69 | HR 69 | Temp 98.2°F | Resp 18 | Ht 63.0 in | Wt 120.8 lb

## 2022-08-08 DIAGNOSIS — E559 Vitamin D deficiency, unspecified: Secondary | ICD-10-CM | POA: Insufficient documentation

## 2022-08-08 DIAGNOSIS — D500 Iron deficiency anemia secondary to blood loss (chronic): Secondary | ICD-10-CM | POA: Diagnosis not present

## 2022-08-08 DIAGNOSIS — Z1321 Encounter for screening for nutritional disorder: Secondary | ICD-10-CM

## 2022-08-08 DIAGNOSIS — D0512 Intraductal carcinoma in situ of left breast: Secondary | ICD-10-CM

## 2022-08-08 DIAGNOSIS — Z923 Personal history of irradiation: Secondary | ICD-10-CM | POA: Diagnosis not present

## 2022-08-08 DIAGNOSIS — Z79810 Long term (current) use of selective estrogen receptor modulators (SERMs): Secondary | ICD-10-CM | POA: Diagnosis not present

## 2022-08-08 LAB — CBC WITH DIFFERENTIAL (CANCER CENTER ONLY)
Abs Immature Granulocytes: 0.02 10*3/uL (ref 0.00–0.07)
Basophils Absolute: 0 10*3/uL (ref 0.0–0.1)
Basophils Relative: 0 %
Eosinophils Absolute: 0 10*3/uL (ref 0.0–0.5)
Eosinophils Relative: 1 %
HCT: 37.3 % (ref 36.0–46.0)
Hemoglobin: 11.8 g/dL — ABNORMAL LOW (ref 12.0–15.0)
Immature Granulocytes: 0 %
Lymphocytes Relative: 17 %
Lymphs Abs: 1.3 10*3/uL (ref 0.7–4.0)
MCH: 25.9 pg — ABNORMAL LOW (ref 26.0–34.0)
MCHC: 31.6 g/dL (ref 30.0–36.0)
MCV: 81.8 fL (ref 80.0–100.0)
Monocytes Absolute: 0.6 10*3/uL (ref 0.1–1.0)
Monocytes Relative: 8 %
Neutro Abs: 5.8 10*3/uL (ref 1.7–7.7)
Neutrophils Relative %: 74 %
Platelet Count: 186 10*3/uL (ref 150–400)
RBC: 4.56 MIL/uL (ref 3.87–5.11)
RDW: 14.1 % (ref 11.5–15.5)
WBC Count: 7.8 10*3/uL (ref 4.0–10.5)
nRBC: 0 % (ref 0.0–0.2)

## 2022-08-08 LAB — IRON AND IRON BINDING CAPACITY (CC-WL,HP ONLY)
Iron: 100 ug/dL (ref 28–170)
Saturation Ratios: 27 % (ref 10.4–31.8)
TIBC: 367 ug/dL (ref 250–450)
UIBC: 267 ug/dL (ref 148–442)

## 2022-08-08 LAB — VITAMIN D 25 HYDROXY (VIT D DEFICIENCY, FRACTURES): Vit D, 25-Hydroxy: 30.28 ng/mL (ref 30–100)

## 2022-08-08 LAB — CMP (CANCER CENTER ONLY)
ALT: 13 U/L (ref 0–44)
AST: 16 U/L (ref 15–41)
Albumin: 4 g/dL (ref 3.5–5.0)
Alkaline Phosphatase: 56 U/L (ref 38–126)
Anion gap: 6 (ref 5–15)
BUN: 11 mg/dL (ref 6–20)
CO2: 25 mmol/L (ref 22–32)
Calcium: 8.6 mg/dL — ABNORMAL LOW (ref 8.9–10.3)
Chloride: 107 mmol/L (ref 98–111)
Creatinine: 0.67 mg/dL (ref 0.44–1.00)
GFR, Estimated: >60 mL/min (ref >60)
Glucose, Bld: 119 mg/dL — ABNORMAL HIGH (ref 70–99)
Potassium: 4 mmol/L (ref 3.5–5.1)
Sodium: 138 mmol/L (ref 135–145)
Total Bilirubin: 0.5 mg/dL (ref 0.3–1.2)
Total Protein: 6.9 g/dL (ref 6.5–8.1)

## 2022-08-08 LAB — VITAMIN B12: Vitamin B-12: 215 pg/mL (ref 180–914)

## 2022-08-08 LAB — TSH: TSH: 3.335 u[IU]/mL (ref 0.350–4.500)

## 2022-08-08 LAB — FERRITIN: Ferritin: 22 ng/mL (ref 11–307)

## 2022-08-08 MED ORDER — RALOXIFENE HCL 60 MG PO TABS: 60.0000 mg | ORAL_TABLET | Freq: Every day | ORAL | 2 refills | Status: DC

## 2022-08-08 MED ORDER — ERGOCALCIFEROL 1.25 MG (50000 UT) PO CAPS
50000.0000 [IU] | ORAL_CAPSULE | ORAL | 0 refills | Status: AC
Start: 2022-08-08 — End: ?

## 2022-08-08 NOTE — Progress Notes (Signed)
Patient requested B12 lab to be drawn. Ok per Dr. Mosetta Putt

## 2022-08-12 ENCOUNTER — Encounter: Payer: Self-pay | Admitting: Hematology

## 2022-08-14 ENCOUNTER — Ambulatory Visit: Payer: 59 | Admitting: Physical Therapy

## 2022-08-14 ENCOUNTER — Encounter: Payer: Self-pay | Admitting: Physical Therapy

## 2022-08-14 DIAGNOSIS — R252 Cramp and spasm: Secondary | ICD-10-CM

## 2022-08-14 DIAGNOSIS — M25612 Stiffness of left shoulder, not elsewhere classified: Secondary | ICD-10-CM | POA: Diagnosis not present

## 2022-08-14 NOTE — Therapy (Signed)
OUTPATIENT PHYSICAL THERAPY TREATMENT NOTE   Patient Name: Beth Stephens MRN: [id number] DOB:1975-12-30, 47 y.o., female Today's Date: 08/14/2022  PCP: Irena Reichmann, DO REFERRING PROVIDER: Lonie Peak, MD  END OF SESSION:   PT End of Session - 08/14/22 0929     Visit Number 29    Number of Visits 43    Date for PT Re-Evaluation 09/26/22    Authorization Type U[employer]    Authorization - Number of Visits 60    PT Start Time 0930    PT Stop Time 1025    PT Time Calculation (min) 55 min    Activity Tolerance Patient tolerated treatment well    Behavior During Therapy WFL for tasks assessed/performed                    Past Medical History:  Diagnosis Date   Cancer (HCC)    Breast Cancer   Colitis    Colitis    Past Surgical History:  Procedure Laterality Date   BREAST LUMPECTOMY WITH RADIOACTIVE SEED LOCALIZATION Left 05/01/2021   Procedure: LEFT BREAST LUMPECTOMY WITH RADIOACTIVE SEED LOCALIZATION;  Surgeon: Almond Lint, MD;  Location: [facility] OR;  Service: General;  Laterality: Left;   COLONOSCOPY     WISDOM TOOTH EXTRACTION     Patient Active Problem List   Diagnosis Date Noted   Cancer of left breast (HCC) 11/25/2021   Iron deficiency anemia due to chronic blood loss 05/24/2021   Genetic testing 04/22/2021   Ductal carcinoma in situ (DCIS) of left breast 04/08/2021    REFERRING DIAG: DCIS Lt breast  THERAPY DIAG:  Stiffness of left shoulder, not elsewhere classified  Cramp and spasm  SUBJECTIVE: The shoulder pain is better since injection and the ROM is coming back.   Functional limits with reaching and dressing. Overcompensating with my Rt side.    PERTINENT HISTORY: Left lumpectomy 05/01/21 no SLNB with Dr. Donell Beers.  Large hematoma formation. Radiation completed  PRECAUTIONS: Other: very small lymphedema risk Lt UE and breast due to radiation    Are you having pain? yes-  it is  0/10 at rest in shoulder today  but when I stretch it gets to a  3-4/10 Pain location: anterior/posterior shoulder Pain description: sharp  Aggravating factors: sleeping on it, reaching overhead, fastening bra now in front because can't do behind Relieving factors: rest   OBJECTIVE:  PALPATION: Very hard mandarin orange size hematoma under lateral breast incision, +2 ttp axillary palpation with some puffiness here.  Overall breast not swollen.   12/23/2021 Hematoma is softer with less defined borders, pt is very tender today at lateral lower ribcage   OBSERVATIONS / OTHER ASSESSMENTS: 07/22/21: radiated field still red and healing especially inferior to breast, and peeling blister area in the axilla.        07/02/21: healing hematoma Lt lateral breast, puffiness Lt axilla, wearing soft cotton eye clasp front bra which has no compression but pt reports is comfortable and the only fabric that is okay to wear currently. 11/04/21- hematoma still firm and palpable in left lateral breast, educated pt to try and sleep in her compression bra 08/01/2022; Significant UT compensation with AROM Left shoulder  in standing with poor scapular mobility   UPPER EXTREMITY AROM/PROM:   A/PROM RIGHT  07/02/2021    Shoulder extension 53  Shoulder flexion 174  Shoulder abduction 165  Shoulder internal rotation Behind the back to T6  Shoulder external rotation 90                          (  Blank rows = not tested)   A/PROM LEFT  07/02/2021 07/22/21 08/13/21 08/28/21 11/04/21 12/23/2021 02/28/2022 06/06/2022 08/01/2022 5/16  Shoulder extension 54    66 60 60 40 32 125  Shoulder flexion  145 - ache top of shoulde 145 - ache top of shoulde 155 - with top of the shoulder pinch 155 - no pinch  151 - feels pain coming on 155 pulls at rib 159 95 pinch 82 125  Shoulder abduction 160 155 - ache top of the shoulder  155 - ache top of the shoulder  167- no pinch 150 - feels pain coming on 150 with pinch at end range 140 Fear of pinch 72pain 73   Shoulder internal rotation Behind the back to T4     T8 with  pinch  Left buttocks Left buttocks L3  Shoulder external rotation 90     94 with pinch at endrange   30 supine @ 45                           (Blank rows = not tested)    06/06/2022 PROM left shoulder Flex 115, IR at 45 degrees abd 63, ER at 45 deg 37   TODAY'S TREATMENT  08/14/22 Reviewed HEP - Pt doing a variety of AA/ROM exercises.  PT gave options for standing counter "L" stretch for flexion of shoulders, towel stretch for functional IR 5x5" Trigger Point Dry-Needling  Treatment instructions: Expect mild to moderate muscle soreness. S/S of pneumothorax if dry needled over a lung field, and to seek immediate medical attention should they occur. Patient verbalized understanding of these instructions and education.  Patient Consent Given: Yes Education handout provided: No - verbally reviewed Muscles treated: Lt upper trap in prone - Pt did not want to do more than 1 muscle today Electrical stimulation performed: No Parameters:  N/A Treatment response/outcome: deep ache  STM upper trap, infraspinatus, lat, teres minor/major, posterior deltoid Scapular mobs in prone Supine P/ROM cardinal planes Supine P/ROM with end range joint mobs inf/post for flexion, scaption Gr III/IV Moist heat Lt upper quadrant end of session x 10'  08/05/2021 Gr 3/4 GH mobs post and inferior Pt in supine for soft tissue mobilization to left UT, Lats, pectorals, scapular area with cocoa butter. Cupping to all borders of scapular region Supine wand flexion, scaption, ER x 5-7 reps PROM left shoulder flexion, scaption, abd, IR and ER  08/01/2022 Measured left shoulder AROM Pt in supine for soft tissue mobilization to left UT, Lats, pectorals, scapular area with cocoa butter. Cupping to all borders of scapular region. Pts skin much redder than usual with cupping today, but still resolved quickly Gr 3/4 GH mobs post and inferior Supine wand flexion, scaption, ER x 5-7 reps Educated in posterior capsule stretch and  IR stretch behind back Updated HEP   PATIENT EDUCATION:  Access Code: [email or web address] URL: [email or web address] Date: 01/06/2022 Prepared by: Alvira Monday  Exercises - Supine Shoulder Alphabet  - 1 x daily - 7 x weekly - Supine Single Arm Shoulder Protraction  - 1 x daily - 7 x weekly - 10 reps  Education details: theraband exs SR, IR and ER with yellow Person educated: Patient Education method: Explanation, Demonstration, Tactile cues, Verbal cues, and Handouts Education comprehension: verbalized understanding, returned demonstration, verbal cues required, and needs further education    HOME EXERCISE PROGRAM: Access Code: [email or web address] URL: [email or web address] Date: 08/28/2021 Prepared by: Gwenevere Abbot  Exercises - Serratus Forearm Push  Up Plus at Wall with Elbows - 1 x daily - 3 x weekly - 1 sets - 10 reps - no hold - Standing Shoulder Row with Anchored Resistance - 1 x daily - 3 x weekly - 1-3 sets - 10 reps - 3sec hold - Shoulder extension with resistance - Neutral - 1 x daily - 3 x weekly - 1-3 sets - 10 reps - 3sec hold - Standing Shoulder External Rotation with Resistance - 1 x daily - 3 x weekly - 1-3 sets - 10 reps - 3sec hold - Single Arm Doorway Pec Stretch at 90 Degrees Abduction - 1-2 x daily - 7 x weekly - 2 reps - 30-60sec hold  ASSESSMENT:   CLINICAL IMPRESSION:  Pt's Lt shoulder A/ROM has improved significantly, measuring 125 deg in flexion and abduction and reaching L3 with functional IR.  She is able to tolerate end range joint mobilizations.  She has several muscle groups which are good candidates for TPDN but only Lt upper trap treated today per Pt request to only do one muscle today to see how she responds.     OBJECTIVE IMPAIRMENTS decreased knowledge of use of DME, decreased ROM, Decreased strength and increased edema.    ACTIVITY LIMITATIONS community activity and yard work. , overhead reaching activities   PERSONAL FACTORS 1  comorbidity: hematoma complication  are also affecting patient's functional outcome.      REHAB POTENTIAL: Excellent   CLINICAL DECISION MAKING: Stable/uncomplicated   EVALUATION COMPLEXITY: Low  GOALS: Goals reviewed with patient? Yes     LONG TERM GOALS: Target date: 05/23/2022/   carry over and revised prn   09/26/2022   Pt will improve Lt UE AROM to equal to the Rt UE and without pain. Goal revised : ROM within 10 degrees of Right UE for improved reaching Baseline:  Goal status: ONGOING   2.  Pt will be ind with self care regarding Lt breast hematoma including self massage, compression bra, and use of foam or swell spot Baseline:  Goal status: MET - pt reports it has softened but it is still there  3. Pt will not demonstrate shooting breast  pains to allow improved comfort.  Baseline:  Goals status: MET if not sleeping on left side  4. Pt will be independent in HEP for left shoulder ROM and in particular strengthening             GOAL STATUS : Ongoing  5. Pt will improve shoulder flexion and abduction to atleast 120 degrees for improved reaching  Goal status   Revised from 150 (08/01/22)  6. Pt will have decreased left shoulder pain no greater than 2/10  7. Pt will be able to fasten bra behind back  Goal Status: New  PLAN: PT FREQUENCY: 2x/week   PT DURATION: 8 weeks    PLANNED INTERVENTIONS: Therapeutic exercises, Patient/Family education, Joint mobilization, DME instructions, Manual lymph drainage, Compression bandaging, scar mobilization, Dry Needling Taping, and Manual therapy, iontophoresis with dexamethasone*   PLAN FOR NEXT SESSION:  f/u on DN to Lt upper trap, reassess and repeat and/or treat posterior shoulder and lat if agreeable, (pt now with symptoms of Left Frozen shoulder, (RECENT INJECTION 07/11/22), Dry Needling,STM/cupping, scapular and GH mob, working on increase A/PROM and decreasing shoulder pain with overhead reach; PROM,MLD lateral trunk prn,  progress  to strength as ROM improves. 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 We are  co-treating CA and ortho with same goals.Loistine Simas Estephany Perot, PT 08/14/22 1:31 PM

## 2022-08-15 ENCOUNTER — Ambulatory Visit: Payer: 59

## 2022-08-20 ENCOUNTER — Encounter: Payer: Self-pay | Admitting: Rehabilitative and Restorative Service Providers"

## 2022-08-20 ENCOUNTER — Ambulatory Visit: Payer: 59 | Admitting: Rehabilitative and Restorative Service Providers"

## 2022-08-20 DIAGNOSIS — M25612 Stiffness of left shoulder, not elsewhere classified: Secondary | ICD-10-CM | POA: Diagnosis not present

## 2022-08-20 DIAGNOSIS — D0512 Intraductal carcinoma in situ of left breast: Secondary | ICD-10-CM

## 2022-08-20 DIAGNOSIS — N6489 Other specified disorders of breast: Secondary | ICD-10-CM

## 2022-08-20 DIAGNOSIS — R252 Cramp and spasm: Secondary | ICD-10-CM

## 2022-08-20 DIAGNOSIS — M7981 Nontraumatic hematoma of soft tissue: Secondary | ICD-10-CM

## 2022-08-20 NOTE — Therapy (Signed)
OUTPATIENT PHYSICAL THERAPY TREATMENT NOTE   Patient Name: Beth Stephens MRN: [id number] DOB:1975/05/14, 47 y.o., female Today's Date: 08/20/2022  PCP: Irena Reichmann, DO REFERRING PROVIDER: Lonie Peak, MD  END OF SESSION:   PT End of Session - 08/20/22 0908     Visit Number 30    Date for PT Re-Evaluation 09/26/22    Authorization Type U[employer]    Authorization - Visit Number 30    Authorization - Number of Visits 60    PT Start Time 0904   Pt arrived late   PT Stop Time 0927    PT Time Calculation (min) 23 min    Activity Tolerance Patient tolerated treatment well    Behavior During Therapy [facility] for tasks assessed/performed                    Past Medical History:  Diagnosis Date   Cancer (HCC)    Breast Cancer   Colitis    Colitis    Past Surgical History:  Procedure Laterality Date   BREAST LUMPECTOMY WITH RADIOACTIVE SEED LOCALIZATION Left 05/01/2021   Procedure: LEFT BREAST LUMPECTOMY WITH RADIOACTIVE SEED LOCALIZATION;  Surgeon: Almond Lint, MD;  Location: [facility] OR;  Service: General;  Laterality: Left;   COLONOSCOPY     WISDOM TOOTH EXTRACTION     Patient Active Problem List   Diagnosis Date Noted   Cancer of left breast (HCC) 11/25/2021   Iron deficiency anemia due to chronic blood loss 05/24/2021   Genetic testing 04/22/2021   Ductal carcinoma in situ (DCIS) of left breast 04/08/2021    REFERRING DIAG: DCIS Lt breast  THERAPY DIAG:  Stiffness of left shoulder, not elsewhere classified  Breast edema  Cramp and spasm  Nontraumatic hematoma of soft tissue  Ductal carcinoma in situ (DCIS) of left breast  SUBJECTIVE: Patient reports that she is feeling some better and notices some increased range of motion.  States current pain of 0/10.  PERTINENT HISTORY: Left lumpectomy 05/01/21 no SLNB with Dr. Donell Beers.  Large hematoma formation. Radiation completed  PRECAUTIONS: Other: very small lymphedema risk Lt UE and breast due to  radiation    Are you having pain? yes-  it is  0/10 at rest in shoulder today  but when I stretch it gets to a 5-6/10 at worst Pain location: anterior/posterior shoulder Pain description: sharp  Aggravating factors: sleeping on it, reaching overhead, fastening bra now in front because can't do behind Relieving factors: rest   OBJECTIVE:  PALPATION: Very hard mandarin orange size hematoma under lateral breast incision, +2 ttp axillary palpation with some puffiness here.  Overall breast not swollen.   12/23/2021 Hematoma is softer with less defined borders, pt is very tender today at lateral lower ribcage   OBSERVATIONS / OTHER ASSESSMENTS: 07/22/21: radiated field still red and healing especially inferior to breast, and peeling blister area in the axilla.        07/02/21: healing hematoma Lt lateral breast, puffiness Lt axilla, wearing soft cotton eye clasp front bra which has no compression but pt reports is comfortable and the only fabric that is okay to wear currently. 11/04/21- hematoma still firm and palpable in left lateral breast, educated pt to try and sleep in her compression bra 08/01/2022; Significant UT compensation with AROM Left shoulder  in standing with poor scapular mobility   UPPER EXTREMITY AROM/PROM:   A/PROM RIGHT  07/02/2021    Shoulder extension 53  Shoulder flexion 174  Shoulder abduction 165  Shoulder internal  rotation Behind the back to T6  Shoulder external rotation 90                          (Blank rows = not tested)   A/PROM LEFT  07/02/2021 07/22/21 08/13/21 08/28/21 11/04/21 12/23/2021 02/28/2022 06/06/2022 08/01/2022 5/16  Shoulder extension 54    66 60 60 40 32 125  Shoulder flexion  145 - ache top of shoulde 145 - ache top of shoulde 155 - with top of the shoulder pinch 155 - no pinch  151 - feels pain coming on 155 pulls at rib 159 95 pinch 82 125  Shoulder abduction 160 155 - ache top of the shoulder  155 - ache top of the shoulder  167- no pinch 150 - feels pain coming  on 150 with pinch at end range 140 Fear of pinch 72pain 73   Shoulder internal rotation Behind the back to T4     T8 with pinch  Left buttocks Left buttocks L3  Shoulder external rotation 90     94 with pinch at endrange   30 supine @ 45                           (Blank rows = not tested)    06/06/2022 PROM left shoulder Flex 115, IR at 45 degrees abd 63, ER at 45 deg 37  08/20/2022: Left shoulder flexion A/ROM 130 degrees (pre-needling), A/ROM 135 degrees after dry needling Left shoulder abduction A/ROM 125 degrees pre-needling, A/ROM 131 degrees after dry needling   TODAY'S TREATMENT   DATE:  08/20/2022 Shoulder pulleys for flexion and abduction x2 min each Shoulder IR towel stretch 4x15 sec Shoulder ER towel stretch (behind back) 3x15 sec Trigger Point Dry-Needling  Treatment instructions: Expect mild to moderate muscle soreness. S/S of pneumothorax if dry needled over a lung field, and to seek immediate medical attention should they occur. Patient verbalized understanding of these instructions and education. Patient Consent Given: Yes Education handout provided: Previously provided Muscles treated: left upper trap, left rhomboid, left lats Electrical stimulation performed: No Parameters: N/A Treatment response/outcome: Utilized skilled palpation to identify bony landmarks and trigger points.  Able to illicit twitch response and muscle elongation.  Soft tissue mobilization following to further promote tissue elongation.    08/14/22 Reviewed HEP - Pt doing a variety of AA/ROM exercises.  PT gave options for standing counter "L" stretch for flexion of shoulders, towel stretch for functional IR 5x5" Trigger Point Dry-Needling  Treatment instructions: Expect mild to moderate muscle soreness. S/S of pneumothorax if dry needled over a lung field, and to seek immediate medical attention should they occur. Patient verbalized understanding of these instructions and education.  Patient Consent  Given: Yes Education handout provided: No - verbally reviewed Muscles treated: Lt upper trap in prone - Pt did not want to do more than 1 muscle today Electrical stimulation performed: No Parameters:  N/A Treatment response/outcome: deep ache  STM upper trap, infraspinatus, lat, teres minor/major, posterior deltoid Scapular mobs in prone Supine P/ROM cardinal planes Supine P/ROM with end range joint mobs inf/post for flexion, scaption Gr III/IV Moist heat Lt upper quadrant end of session x 10'  08/05/2021 Gr 3/4 GH mobs post and inferior Pt in supine for soft tissue mobilization to left UT, Lats, pectorals, scapular area with cocoa butter. Cupping to all borders of scapular region Supine wand flexion, scaption, ER x 5-7 reps  PROM left shoulder flexion, scaption, abd, IR and ER    PATIENT EDUCATION:  Access Code: [email or web address] URL: [email or web address] Date: 01/06/2022 Prepared by: Alvira Monday  Exercises - Supine Shoulder Alphabet  - 1 x daily - 7 x weekly - Supine Single Arm Shoulder Protraction  - 1 x daily - 7 x weekly - 10 reps  Education details: theraband exs SR, IR and ER with yellow Person educated: Patient Education method: Explanation, Demonstration, Tactile cues, Verbal cues, and Handouts Education comprehension: verbalized understanding, returned demonstration, verbal cues required, and needs further education    HOME EXERCISE PROGRAM: Access Code: [email or web address] URL: [email or web address] Date: 08/28/2021 Prepared by: Gwenevere Abbot  Exercises - Serratus Forearm Push Up Plus at Wall with Elbows - 1 x daily - 3 x weekly - 1 sets - 10 reps - no hold - Standing Shoulder Row with Anchored Resistance - 1 x daily - 3 x weekly - 1-3 sets - 10 reps - 3sec hold - Shoulder extension with resistance - Neutral - 1 x daily - 3 x weekly - 1-3 sets - 10 reps - 3sec hold - Standing Shoulder External Rotation with Resistance - 1 x daily - 3 x weekly - 1-3 sets -  10 reps - 3sec hold - Single Arm Doorway Pec Stretch at 90 Degrees Abduction - 1-2 x daily - 7 x weekly - 2 reps - 30-60sec hold  ASSESSMENT:   CLINICAL IMPRESSION:  Ms Rhem presents to skilled PT reporting that she has noted some improvements from dry needling and is willing to try again.  Patient enjoyed the pulleys and educated that she can purchase some for home use if this is something that helped her with A/ROM, she verbalizes understanding and states that she may get some to use at work.  Patient with good response to dry needling and manual therapy with increased A/ROM of 5 degrees noted pre/post needling.  Patient continues to require skilled PT to progress towards goal related activities and increased functional use of left shoulder.     OBJECTIVE IMPAIRMENTS decreased knowledge of use of DME, decreased ROM, Decreased strength and increased edema.    ACTIVITY LIMITATIONS community activity and yard work. , overhead reaching activities   PERSONAL FACTORS 1 comorbidity: hematoma complication  are also affecting patient's functional outcome.      REHAB POTENTIAL: Excellent   CLINICAL DECISION MAKING: Stable/uncomplicated   EVALUATION COMPLEXITY: Low  GOALS: Goals reviewed with patient? Yes     LONG TERM GOALS: Target date: 05/23/2022/   carry over and revised prn   09/26/2022   Pt will improve Lt UE AROM to equal to the Rt UE and without pain. Goal revised : ROM within 10 degrees of Right UE for improved reaching Baseline:  Goal status: ONGOING   2.  Pt will be ind with self care regarding Lt breast hematoma including self massage, compression bra, and use of foam or swell spot Baseline:  Goal status: MET - pt reports it has softened but it is still there  3. Pt will not demonstrate shooting breast  pains to allow improved comfort.  Baseline:  Goals status: MET if not sleeping on left side  4. Pt will be independent in HEP for left shoulder ROM and in particular  strengthening             GOAL STATUS : Ongoing  5. Pt will improve shoulder flexion and abduction to atleast 120 degrees for improved reaching  Goal status   Revised from  150 (08/01/22)  6. Pt will have decreased left shoulder pain no greater than 2/10  7. Pt will be able to fasten bra behind back  Goal Status: New  PLAN: PT FREQUENCY: 2x/week   PT DURATION: 8 weeks    PLANNED INTERVENTIONS: Therapeutic exercises, Patient/Family education, Joint mobilization, DME instructions, Manual lymph drainage, Compression bandaging, scar mobilization, Dry Needling Taping, and Manual therapy, iontophoresis with dexamethasone*   PLAN FOR NEXT SESSION:  f/u on DN to Lt upper trap, reassess and repeat and/or treat posterior shoulder and lat if agreeable, (pt now with symptoms of Left Frozen shoulder, (RECENT INJECTION 07/11/22), Dry Needling,STM/cupping, scapular and GH mob, working on increase A/PROM and decreasing shoulder pain with overhead reach; PROM,MLD lateral trunk prn, progress  to strength as ROM improves. 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 We are co-treating CA and ortho with same goals.Reather Laurence, PT 08/20/22 9:51 AM  [facility] [address], [address] [place], [place] [address] Phone # [phone] Fax [phone]

## 2022-08-22 ENCOUNTER — Ambulatory Visit: Payer: 59

## 2022-08-22 DIAGNOSIS — M7981 Nontraumatic hematoma of soft tissue: Secondary | ICD-10-CM

## 2022-08-22 DIAGNOSIS — M25612 Stiffness of left shoulder, not elsewhere classified: Secondary | ICD-10-CM | POA: Diagnosis not present

## 2022-08-22 DIAGNOSIS — N6489 Other specified disorders of breast: Secondary | ICD-10-CM

## 2022-08-22 DIAGNOSIS — R252 Cramp and spasm: Secondary | ICD-10-CM

## 2022-08-22 DIAGNOSIS — D0512 Intraductal carcinoma in situ of left breast: Secondary | ICD-10-CM

## 2022-08-22 NOTE — Therapy (Signed)
OUTPATIENT PHYSICAL THERAPY TREATMENT NOTE   Patient Name: Beth Stephens MRN: [id number] DOB:06-01-1975, 47 y.o., female Today's Date: 08/22/2022  PCP: Irena Reichmann, DO REFERRING PROVIDER: Lonie Peak, MD  END OF SESSION:   PT End of Session - 08/22/22 0909     Visit Number 31    Number of Visits 43    Date for PT Re-Evaluation 09/26/22    Authorization Type UHC    Authorization - Visit Number 31    PT Start Time 0909    PT Stop Time 0957    PT Time Calculation (min) 48 min    Activity Tolerance Patient tolerated treatment well    Behavior During Therapy WFL for tasks assessed/performed                    Past Medical History:  Diagnosis Date   Cancer (HCC)    Breast Cancer   Colitis    Colitis    Past Surgical History:  Procedure Laterality Date   BREAST LUMPECTOMY WITH RADIOACTIVE SEED LOCALIZATION Left 05/01/2021   Procedure: LEFT BREAST LUMPECTOMY WITH RADIOACTIVE SEED LOCALIZATION;  Surgeon: Almond Lint, MD;  Location: [facility] OR;  Service: General;  Laterality: Left;   COLONOSCOPY     WISDOM TOOTH EXTRACTION     Patient Active Problem List   Diagnosis Date Noted   Cancer of left breast (HCC) 11/25/2021   Iron deficiency anemia due to chronic blood loss 05/24/2021   Genetic testing 04/22/2021   Ductal carcinoma in situ (DCIS) of left breast 04/08/2021    REFERRING DIAG: DCIS Lt breast  THERAPY DIAG:  Stiffness of left shoulder, not elsewhere classified  Breast edema  Cramp and spasm  Nontraumatic hematoma of soft tissue  Ductal carcinoma in situ (DCIS) of left breast  SUBJECTIVE: I got sore after the second dry needling. I think my motion is improving. I am trying  PERTINENT HISTORY: Left lumpectomy 05/01/21 no SLNB with Dr. Donell Beers.  Large hematoma formation. Radiation completed  PRECAUTIONS: Other: very small lymphedema risk Lt UE and breast due to radiation    Are you having pain? yes-  it is  0/10 at rest in shoulder today  but  when I stretch it gets to a 5-6/10 at worst Pain location: anterior/posterior shoulder Pain description: sharp  Aggravating factors: sleeping on it, reaching overhead, fastening bra now in front because can't do behind Relieving factors: rest   OBJECTIVE:  PALPATION: Very hard mandarin orange size hematoma under lateral breast incision, +2 ttp axillary palpation with some puffiness here.  Overall breast not swollen.   12/23/2021 Hematoma is softer with less defined borders, pt is very tender today at lateral lower ribcage   OBSERVATIONS / OTHER ASSESSMENTS: 07/22/21: radiated field still red and healing especially inferior to breast, and peeling blister area in the axilla.        07/02/21: healing hematoma Lt lateral breast, puffiness Lt axilla, wearing soft cotton eye clasp front bra which has no compression but pt reports is comfortable and the only fabric that is okay to wear currently. 11/04/21- hematoma still firm and palpable in left lateral breast, educated pt to try and sleep in her compression bra 08/01/2022; Significant UT compensation with AROM Left shoulder  in standing with poor scapular mobility   UPPER EXTREMITY AROM/PROM:   A/PROM RIGHT  07/02/2021    Shoulder extension 53  Shoulder flexion 174  Shoulder abduction 165  Shoulder internal rotation Behind the back to T6  Shoulder external rotation  90                          (Blank rows = not tested)   A/PROM LEFT  07/02/2021 07/22/21 08/13/21 08/28/21 11/04/21 12/23/2021 02/28/2022 06/06/2022 08/01/2022 5/16  Shoulder extension 54    66 60 60 40 32 125  Shoulder flexion  145 - ache top of shoulde 145 - ache top of shoulde 155 - with top of the shoulder pinch 155 - no pinch  151 - feels pain coming on 155 pulls at rib 159 95 pinch 82 125  Shoulder abduction 160 155 - ache top of the shoulder  155 - ache top of the shoulder  167- no pinch 150 - feels pain coming on 150 with pinch at end range 140 Fear of pinch 72pain 73   Shoulder internal  rotation Behind the back to T4     T8 with pinch  Left buttocks Left buttocks L3  Shoulder external rotation 90     94 with pinch at endrange   30 supine @ 45                           (Blank rows = not tested)    06/06/2022 PROM left shoulder Flex 115, IR at 45 degrees abd 63, ER at 45 deg 37  08/20/2022: Left shoulder flexion A/ROM 130 degrees (pre-needling), A/ROM 135 degrees after dry needling Left shoulder abduction A/ROM 125 degrees pre-needling, A/ROM 131 degrees after dry needling   TODAY'S TREATMENT   08/22/2022 Pulleys x 2 min flexion and scaption. Gr 3/4 GH mobs post and inferior, scapular mobs in right SL Pt in supine for soft tissue mobilization to left UT, Lats, pectorals, scapular area with cocoa butter. Supine wand flexion, scaption, x 5 reps PROM left shoulder flexion, scaption, abd, IR and ER with C-R Sleeper stretch x 4 IR/ER     DATE:  08/20/2022 Shoulder pulleys for flexion and abduction x2 min each Shoulder IR towel stretch 4x15 sec Shoulder ER towel stretch (behind back) 3x15 sec Trigger Point Dry-Needling  Treatment instructions: Expect mild to moderate muscle soreness. S/S of pneumothorax if dry needled over a lung field, and to seek immediate medical attention should they occur. Patient verbalized understanding of these instructions and education. Patient Consent Given: Yes Education handout provided: Previously provided Muscles treated: left upper trap, left rhomboid, left lats Electrical stimulation performed: No Parameters: N/A Treatment response/outcome: Utilized skilled palpation to identify bony landmarks and trigger points.  Able to illicit twitch response and muscle elongation.  Soft tissue mobilization following to further promote tissue elongation.    08/14/22 Reviewed HEP - Pt doing a variety of AA/ROM exercises.  PT gave options for standing counter "L" stretch for flexion of shoulders, towel stretch for functional IR 5x5" Trigger Point  Dry-Needling  Treatment instructions: Expect mild to moderate muscle soreness. S/S of pneumothorax if dry needled over a lung field, and to seek immediate medical attention should they occur. Patient verbalized understanding of these instructions and education.  Patient Consent Given: Yes Education handout provided: No - verbally reviewed Muscles treated: Lt upper trap in prone - Pt did not want to do more than 1 muscle today Electrical stimulation performed: No Parameters:  N/A Treatment response/outcome: deep ache  STM upper trap, infraspinatus, lat, teres minor/major, posterior deltoid Scapular mobs in prone Supine P/ROM cardinal planes Supine P/ROM with end range joint mobs inf/post for flexion,  scaption Gr III/IV Moist heat Lt upper quadrant end of session x 10'  08/05/2021 Gr 3/4 GH mobs post and inferior Pt in supine for soft tissue mobilization to left UT, Lats, pectorals, scapular area with cocoa butter. Cupping to all borders of scapular region Supine wand flexion, scaption, ER x 5-7 reps PROM left shoulder flexion, scaption, abd, IR and ER    PATIENT EDUCATION:  Access Code: [email or web address] URL: [email or web address] Date: 01/06/2022 Prepared by: Alvira Monday  Exercises - Supine Shoulder Alphabet  - 1 x daily - 7 x weekly - Supine Single Arm Shoulder Protraction  - 1 x daily - 7 x weekly - 10 reps  Education details: theraband exs SR, IR and ER with yellow Person educated: Patient Education method: Explanation, Demonstration, Tactile cues, Verbal cues, and Handouts Education comprehension: verbalized understanding, returned demonstration, verbal cues required, and needs further education    HOME EXERCISE PROGRAM: Access Code: [email or web address] URL: [email or web address] Date: 08/28/2021 Prepared by: Gwenevere Abbot  Exercises - Serratus Forearm Push Up Plus at Wall with Elbows - 1 x daily - 3 x weekly - 1 sets - 10 reps - no hold - Standing Shoulder Row  with Anchored Resistance - 1 x daily - 3 x weekly - 1-3 sets - 10 reps - 3sec hold - Shoulder extension with resistance - Neutral - 1 x daily - 3 x weekly - 1-3 sets - 10 reps - 3sec hold - Standing Shoulder External Rotation with Resistance - 1 x daily - 3 x weekly - 1-3 sets - 10 reps - 3sec hold - Single Arm Doorway Pec Stretch at 90 Degrees Abduction - 1-2 x daily - 7 x weekly - 2 reps - 30-60sec hold  ASSESSMENT:   CLINICAL IMPRESSION:  Pt loosened up well after manual work and stretching. Still very tight in lats, but scapular region improved today.     OBJECTIVE IMPAIRMENTS decreased knowledge of use of DME, decreased ROM, Decreased strength and increased edema.    ACTIVITY LIMITATIONS community activity and yard work. , overhead reaching activities   PERSONAL FACTORS 1 comorbidity: hematoma complication  are also affecting patient's functional outcome.      REHAB POTENTIAL: Excellent   CLINICAL DECISION MAKING: Stable/uncomplicated   EVALUATION COMPLEXITY: Low  GOALS: Goals reviewed with patient? Yes     LONG TERM GOALS: Target date: 05/23/2022/   carry over and revised prn   09/26/2022   Pt will improve Lt UE AROM to equal to the Rt UE and without pain. Goal revised : ROM within 10 degrees of Right UE for improved reaching Baseline:  Goal status: ONGOING   2.  Pt will be ind with self care regarding Lt breast hematoma including self massage, compression bra, and use of foam or swell spot Baseline:  Goal status: MET - pt reports it has softened but it is still there  3. Pt will not demonstrate shooting breast  pains to allow improved comfort.  Baseline:  Goals status: MET if not sleeping on left side  4. Pt will be independent in HEP for left shoulder ROM and in particular strengthening             GOAL STATUS : Ongoing  5. Pt will improve shoulder flexion and abduction to atleast 120 degrees for improved reaching  Goal status   Revised from 150 (08/01/22)  6. Pt  will have decreased left shoulder pain no greater than 2/10  7. Pt will be able to fasten bra behind back  Goal Status: New  PLAN: PT FREQUENCY: 2x/week   PT DURATION: 8 weeks    PLANNED INTERVENTIONS: Therapeutic exercises, Patient/Family education, Joint mobilization, DME instructions, Manual lymph drainage, Compression bandaging, scar mobilization, Dry Needling Taping, and Manual therapy, iontophoresis with dexamethasone*   PLAN FOR NEXT SESSION:  f/u on DN to Lt upper trap, reassess and repeat and/or treat posterior shoulder and lat if agreeable, (pt now with symptoms of Left Frozen shoulder, (RECENT INJECTION 07/11/22), Dry Needling,STM/cupping, scapular and GH mob, working on increase A/PROM and decreasing shoulder pain with overhead reach; PROM,MLD lateral trunk prn, progress  to strength as ROM improves. 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 We are co-treating CA and ortho with same goals.Reather Laurence, PT 08/22/22 10:02 AM  [facility] [address] [place], [place] [address] Phone # [phone] Fax [phone]

## 2022-08-27 ENCOUNTER — Ambulatory Visit: Payer: 59 | Admitting: Physical Therapy

## 2022-08-27 ENCOUNTER — Encounter: Payer: Self-pay | Admitting: Physical Therapy

## 2022-08-27 DIAGNOSIS — M25612 Stiffness of left shoulder, not elsewhere classified: Secondary | ICD-10-CM

## 2022-08-27 DIAGNOSIS — R252 Cramp and spasm: Secondary | ICD-10-CM

## 2022-08-27 NOTE — Therapy (Signed)
OUTPATIENT PHYSICAL THERAPY TREATMENT NOTE   Patient Name: Beth Stephens MRN: [id number] DOB:March 12, 1976, 47 y.o., female Today's Date: 08/27/2022  PCP: Irena Reichmann, DO REFERRING PROVIDER: Lonie Peak, MD  END OF SESSION:   PT End of Session - 08/27/22 0805     Visit Number 32    Number of Visits 43    Date for PT Re-Evaluation 09/26/22    Authorization Type UHC    Authorization - Visit Number 32    Authorization - Number of Visits 60    PT Start Time 0805    PT Stop Time 0848    PT Time Calculation (min) 43 min    Activity Tolerance Patient tolerated treatment well    Behavior During Therapy WFL for tasks assessed/performed                     Past Medical History:  Diagnosis Date   Cancer (HCC)    Breast Cancer   Colitis    Colitis    Past Surgical History:  Procedure Laterality Date   BREAST LUMPECTOMY WITH RADIOACTIVE SEED LOCALIZATION Left 05/01/2021   Procedure: LEFT BREAST LUMPECTOMY WITH RADIOACTIVE SEED LOCALIZATION;  Surgeon: Almond Lint, MD;  Location: [facility] OR;  Service: General;  Laterality: Left;   COLONOSCOPY     WISDOM TOOTH EXTRACTION     Patient Active Problem List   Diagnosis Date Noted   Cancer of left breast (HCC) 11/25/2021   Iron deficiency anemia due to chronic blood loss 05/24/2021   Genetic testing 04/22/2021   Ductal carcinoma in situ (DCIS) of left breast 04/08/2021    REFERRING DIAG: DCIS Lt breast  THERAPY DIAG:  Stiffness of left shoulder, not elsewhere classified  Cramp and spasm  SUBJECTIVE: I have no pain if I don't push it.  But if I really push my stretch I can get to 5-7/10.  PERTINENT HISTORY: Left lumpectomy 05/01/21 no SLNB with Dr. Donell Beers.  Large hematoma formation. Radiation completed  PRECAUTIONS: Other: very small lymphedema risk Lt UE and breast due to radiation    Are you having pain? yes-  it is  0/10 at rest in shoulder today  but when I stretch it gets to a 5-6/10 at worst Pain location:  anterior/posterior shoulder Pain description: sharp  Aggravating factors: sleeping on it, reaching overhead, fastening bra now in front because can't do behind Relieving factors: rest   OBJECTIVE:  PALPATION: Very hard mandarin orange size hematoma under lateral breast incision, +2 ttp axillary palpation with some puffiness here.  Overall breast not swollen.   12/23/2021 Hematoma is softer with less defined borders, pt is very tender today at lateral lower ribcage   OBSERVATIONS / OTHER ASSESSMENTS: 07/22/21: radiated field still red and healing especially inferior to breast, and peeling blister area in the axilla.        07/02/21: healing hematoma Lt lateral breast, puffiness Lt axilla, wearing soft cotton eye clasp front bra which has no compression but pt reports is comfortable and the only fabric that is okay to wear currently. 11/04/21- hematoma still firm and palpable in left lateral breast, educated pt to try and sleep in her compression bra 08/01/2022; Significant UT compensation with AROM Left shoulder  in standing with poor scapular mobility   UPPER EXTREMITY AROM/PROM:   A/PROM RIGHT  07/02/2021    Shoulder extension 53  Shoulder flexion 174  Shoulder abduction 165  Shoulder internal rotation Behind the back to T6  Shoulder external rotation 90                          (  Blank rows = not tested)   A/PROM LEFT  07/02/2021 07/22/21 08/13/21 08/28/21 11/04/21 12/23/2021 02/28/2022 06/06/2022 08/01/2022 5/16 5/29  Shoulder extension 54    66 60 60 40 32 125 140  Shoulder flexion  145 - ache top of shoulde 145 - ache top of shoulde 155 - with top of the shoulder pinch 155 - no pinch  151 - feels pain coming on 155 pulls at rib 159 95 pinch 82 125 132  Shoulder abduction 160 155 - ache top of the shoulder  155 - ache top of the shoulder  167- no pinch 150 - feels pain coming on 150 with pinch at end range 140 Fear of pinch 72pain 73    Shoulder internal rotation Behind the back to T4     T8 with pinch   Left buttocks Left buttocks L3 T/L jxn with end range pinch  Shoulder external rotation 90     94 with pinch at endrange   30 supine @ 45                            (Blank rows = not tested)    06/06/2022 PROM left shoulder Flex 115, IR at 45 degrees abd 63, ER at 45 deg 37  08/20/2022: Left shoulder flexion A/ROM 130 degrees (pre-needling), A/ROM 135 degrees after dry needling Left shoulder abduction A/ROM 125 degrees pre-needling, A/ROM 131 degrees after dry needling   TODAY'S TREATMENT  08/27/22: Dowel overhead flexion cane +3lb ankle weight x10 hold end range flexion 5-10 sec Supine D2 flexion Lt holding blue plyoball x8 Supine serratus press x8 at 90 deg holding blue plyoball, then CW and CCW circles x10 each way Supine bil ER red tband x10 Trigger Point Dry-Needling  Treatment instructions: Expect mild to moderate muscle soreness. S/S of pneumothorax if dry needled over a lung field, and to seek immediate medical attention should they occur. Patient verbalized understanding of these instructions and education.  Patient Consent Given: Yes Education handout provided: Previously provided Muscles treated: Lt infraspinatus, Lt lat Electrical stimulation performed: No Parameters: N/A Treatment response/outcome: deep ache, mild twitch Lt infraspinatus STM Lt posterior shoulder after DN AA/ROM Lt shoulder prone for functional IR and shoulder extension x 5 reps each Moist heat Lt shoulder end of session x 5'   08/22/2022 Pulleys x 2 min flexion and scaption. Gr 3/4 GH mobs post and inferior, scapular mobs in right SL Pt in supine for soft tissue mobilization to left UT, Lats, pectorals, scapular area with cocoa butter. Supine wand flexion, scaption, x 5 reps PROM left shoulder flexion, scaption, abd, IR and ER with C-R Sleeper stretch x 4 IR/ER  DATE:  08/20/2022 Shoulder pulleys for flexion and abduction x2 min each Shoulder IR towel stretch 4x15 sec Shoulder ER towel stretch  (behind back) 3x15 sec Trigger Point Dry-Needling  Treatment instructions: Expect mild to moderate muscle soreness. S/S of pneumothorax if dry needled over a lung field, and to seek immediate medical attention should they occur. Patient verbalized understanding of these instructions and education. Patient Consent Given: Yes Education handout provided: Previously provided Muscles treated: left upper trap, left rhomboid, left lats Electrical stimulation performed: No Parameters: N/A Treatment response/outcome: Utilized skilled palpation to identify bony landmarks and trigger points.  Able to illicit twitch response and muscle elongation.  Soft tissue mobilization following to further promote tissue elongation.    08/14/22 Reviewed HEP - Pt doing a variety of AA/ROM exercises.  PT gave options for standing counter "L" stretch for flexion of shoulders, towel stretch for functional IR 5x5" Trigger Point Dry-Needling  Treatment instructions: Expect mild to moderate muscle soreness. S/S of pneumothorax if dry needled over a lung field, and to seek immediate medical attention should they occur. Patient verbalized understanding of these instructions and education.  Patient Consent Given: Yes Education handout provided: No - verbally reviewed Muscles treated: Lt upper trap in prone - Pt did not want to do more than 1 muscle today Electrical stimulation performed: No Parameters:  N/A Treatment response/outcome: deep ache  STM upper trap, infraspinatus, lat, teres minor/major, posterior deltoid Scapular mobs in prone Supine P/ROM cardinal planes Supine P/ROM with end range joint mobs inf/post for flexion, scaption Gr III/IV Moist heat Lt upper quadrant end of session x 10'   PATIENT EDUCATION:  Access Code: [email or web address] URL: [email or web address] Date: 01/06/2022 Prepared by: Alvira Monday  Exercises - Supine Shoulder Alphabet  - 1 x daily - 7 x weekly - Supine Single Arm Shoulder  Protraction  - 1 x daily - 7 x weekly - 10 reps  Education details: theraband exs SR, IR and ER with yellow Person educated: Patient Education method: Explanation, Demonstration, Tactile cues, Verbal cues, and Handouts Education comprehension: verbalized understanding, returned demonstration, verbal cues required, and needs further education    HOME EXERCISE PROGRAM: Access Code: [email or web address] URL: [email or web address] Date: 08/28/2021 Prepared by: Gwenevere Abbot  Exercises - Serratus Forearm Push Up Plus at Wall with Elbows - 1 x daily - 3 x weekly - 1 sets - 10 reps - no hold - Standing Shoulder Row with Anchored Resistance - 1 x daily - 3 x weekly - 1-3 sets - 10 reps - 3sec hold - Shoulder extension with resistance - Neutral - 1 x daily - 3 x weekly - 1-3 sets - 10 reps - 3sec hold - Standing Shoulder External Rotation with Resistance - 1 x daily - 3 x weekly - 1-3 sets - 10 reps - 3sec hold - Single Arm Doorway Pec Stretch at 90 Degrees Abduction - 1-2 x daily - 7 x weekly - 2 reps - 30-60sec hold  ASSESSMENT:   CLINICAL IMPRESSION: Pt making good progress with A/ROM.  She gained 15 deg in flexion and 7 deg in abduction since last measurements on 5/16.  She continues to have end range "pinch" with functional IR but is reaching T/L junction now.  Supine stabilization and shoulder strength was well tolerated today.  PT added 3lb ankle weight to dowel for overhead flexion stretch + strength.  PT performed DN into TP along band in Lt infraspinatus and into Lt lat today with twitch noted in infraspinatus and deep radiating ache with all other points.  Heat used for soreness end of session.       OBJECTIVE IMPAIRMENTS decreased knowledge of use of DME, decreased ROM, Decreased strength and increased edema.    ACTIVITY LIMITATIONS community activity and yard work. , overhead reaching activities   PERSONAL FACTORS 1 comorbidity: hematoma complication  are also affecting patient's  functional outcome.      REHAB POTENTIAL: Excellent   CLINICAL DECISION MAKING: Stable/uncomplicated   EVALUATION COMPLEXITY: Low  GOALS: Goals reviewed with patient? Yes     LONG TERM GOALS: Target date: 05/23/2022/   carry over and revised prn   09/26/2022   Pt will improve Lt UE AROM to equal to the Rt UE and without pain. Goal revised : ROM within 10 degrees of Right UE  for improved reaching Baseline:  Goal status: ONGOING   2.  Pt will be ind with self care regarding Lt breast hematoma including self massage, compression bra, and use of foam or swell spot Baseline:  Goal status: MET - pt reports it has softened but it is still there  3. Pt will not demonstrate shooting breast  pains to allow improved comfort.  Baseline:  Goals status: MET if not sleeping on left side  4. Pt will be independent in HEP for left shoulder ROM and in particular strengthening             GOAL STATUS : Ongoing  5. Pt will improve shoulder flexion and abduction to atleast 120 degrees for improved reaching  Goal status   Revised from 150 (08/01/22)  6. Pt will have decreased left shoulder pain no greater than 2/10  7. Pt will be able to fasten bra behind back  Goal Status: New  PLAN: PT FREQUENCY: 2x/week   PT DURATION: 8 weeks    PLANNED INTERVENTIONS: Therapeutic exercises, Patient/Family education, Joint mobilization, DME instructions, Manual lymph drainage, Compression bandaging, scar mobilization, Dry Needling Taping, and Manual therapy, iontophoresis with dexamethasone*   PLAN FOR NEXT SESSION:  f/u on DN to Lt infraspinatus and lat, (pt now with symptoms of Left Frozen shoulder, continue adding light resistance to Lt shoulder ROM/strength, (RECENT INJECTION 07/11/22), Dry Needling,STM/cupping, scapular and GH mob, working on increase A/PROM and decreasing shoulder pain with overhead reach; PROM,MLD lateral trunk prn, progress  to strength as ROM improves. 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 We are co-treating CA and ortho with same goals.Morton Peters, PT 08/27/22 8:51 AM    [facility] [address], [address] [place], [place] [address] Phone # [phone] Fax [phone]

## 2022-08-29 ENCOUNTER — Ambulatory Visit: Payer: 59

## 2022-08-29 DIAGNOSIS — D0512 Intraductal carcinoma in situ of left breast: Secondary | ICD-10-CM

## 2022-08-29 DIAGNOSIS — M25612 Stiffness of left shoulder, not elsewhere classified: Secondary | ICD-10-CM | POA: Diagnosis not present

## 2022-08-29 DIAGNOSIS — R252 Cramp and spasm: Secondary | ICD-10-CM

## 2022-08-29 DIAGNOSIS — N6489 Other specified disorders of breast: Secondary | ICD-10-CM

## 2022-08-29 DIAGNOSIS — M7981 Nontraumatic hematoma of soft tissue: Secondary | ICD-10-CM

## 2022-08-29 NOTE — Therapy (Signed)
OUTPATIENT PHYSICAL THERAPY TREATMENT NOTE   Patient Name: Beth Stephens MRN: [id number] DOB:03/16/1976, 47 y.o., female Today's Date: 08/29/2022  PCP: Irena Reichmann, DO REFERRING PROVIDER: Lonie Peak, MD  END OF SESSION:   PT End of Session - 08/29/22 1005     Visit Number 33    Number of Visits 43    Date for PT Re-Evaluation 09/26/22    Authorization Type UHC    Authorization - Visit Number 33    PT Start Time 1005    PT Stop Time 1052    PT Time Calculation (min) 47 min    Activity Tolerance Patient tolerated treatment well    Behavior During Therapy WFL for tasks assessed/performed                     Past Medical History:  Diagnosis Date   Cancer (HCC)    Breast Cancer   Colitis    Colitis    Past Surgical History:  Procedure Laterality Date   BREAST LUMPECTOMY WITH RADIOACTIVE SEED LOCALIZATION Left 05/01/2021   Procedure: LEFT BREAST LUMPECTOMY WITH RADIOACTIVE SEED LOCALIZATION;  Surgeon: Almond Lint, MD;  Location: [facility] OR;  Service: General;  Laterality: Left;   COLONOSCOPY     WISDOM TOOTH EXTRACTION     Patient Active Problem List   Diagnosis Date Noted   Cancer of left breast (HCC) 11/25/2021   Iron deficiency anemia due to chronic blood loss 05/24/2021   Genetic testing 04/22/2021   Ductal carcinoma in situ (DCIS) of left breast 04/08/2021    REFERRING DIAG: DCIS Lt breast  THERAPY DIAG:  Stiffness of left shoulder, not elsewhere classified  Cramp and spasm  Breast edema  Nontraumatic hematoma of soft tissue  Ductal carcinoma in situ (DCIS) of left breast  SUBJECTIVE: I don't feel as tight when I do my ROM. She hit some some spots with the DN yesterday. I am sleeping much better now since the injection  PERTINENT HISTORY: Left lumpectomy 05/01/21 no SLNB with Dr. Donell Beers.  Large hematoma formation. Radiation completed  PRECAUTIONS: Other: very small lymphedema risk Lt UE and breast due to radiation    Are you having  pain? yes-  it is  0/10 at rest in shoulder today  but when I stretch it gets to a pinch. Pain location: anterior/posterior shoulder Pain description: sharp  Aggravating factors: sleeping on it, reaching overhead, fastening bra now in front because can't do behind Relieving factors: rest   OBJECTIVE:  PALPATION: Very hard mandarin orange size hematoma under lateral breast incision, +2 ttp axillary palpation with some puffiness here.  Overall breast not swollen.   12/23/2021 Hematoma is softer with less defined borders, pt is very tender today at lateral lower ribcage   OBSERVATIONS / OTHER ASSESSMENTS: 07/22/21: radiated field still red and healing especially inferior to breast, and peeling blister area in the axilla.        07/02/21: healing hematoma Lt lateral breast, puffiness Lt axilla, wearing soft cotton eye clasp front bra which has no compression but pt reports is comfortable and the only fabric that is okay to wear currently. 11/04/21- hematoma still firm and palpable in left lateral breast, educated pt to try and sleep in her compression bra 08/01/2022; Significant UT compensation with AROM Left shoulder  in standing with poor scapular mobility   UPPER EXTREMITY AROM/PROM:   A/PROM RIGHT  07/02/2021    Shoulder extension 53  Shoulder flexion 174  Shoulder abduction 165  Shoulder internal  rotation Behind the back to T6  Shoulder external rotation 90                          (Blank rows = not tested)   A/PROM LEFT  07/02/2021 07/22/21 08/13/21 08/28/21 11/04/21 12/23/2021 02/28/2022 06/06/2022 08/01/2022 5/16 5/29  Shoulder extension 54    66 60 60 40 32 125 140  Shoulder flexion  145 - ache top of shoulde 145 - ache top of shoulde 155 - with top of the shoulder pinch 155 - no pinch  151 - feels pain coming on 155 pulls at rib 159 95 pinch 82 125 132  Shoulder abduction 160 155 - ache top of the shoulder  155 - ache top of the shoulder  167- no pinch 150 - feels pain coming on 150 with pinch at end  range 140 Fear of pinch 72pain 73    Shoulder internal rotation Behind the back to T4     T8 with pinch  Left buttocks Left buttocks L3 T/L jxn with end range pinch  Shoulder external rotation 90     94 with pinch at endrange   30 supine @ 45                            (Blank rows = not tested)    06/06/2022 PROM left shoulder Flex 115, IR at 45 degrees abd 63, ER at 45 deg 37  08/20/2022: Left shoulder flexion A/ROM 130 degrees (pre-needling), A/ROM 135 degrees after dry needling Left shoulder abduction A/ROM 125 degrees pre-needling, A/ROM 131 degrees after dry needling   TODAY'S TREATMENT   08/29/2022 Gr 3 GH mobs post and inferior Supine wand with 2# wt flexion and scaption , ER x 6 reps STM to left pectorals, UT, scapular area and lateral trunk, with cocoa butter and with cupping to pectorals and scapular region, and after stretching STM to left mid delt PROM left shoulder with C-R stretching  08/27/22: Dowel overhead flexion cane +3lb ankle weight x10 hold end range flexion 5-10 sec Supine D2 flexion Lt holding blue plyoball x8 Supine serratus press x8 at 90 deg holding blue plyoball, then CW and CCW circles x10 each way Supine bil ER red tband x10 Trigger Point Dry-Needling  Treatment instructions: Expect mild to moderate muscle soreness. S/S of pneumothorax if dry needled over a lung field, and to seek immediate medical attention should they occur. Patient verbalized understanding of these instructions and education.  Patient Consent Given: Yes Education handout provided: Previously provided Muscles treated: Lt infraspinatus, Lt lat Electrical stimulation performed: No Parameters: N/A Treatment response/outcome: deep ache, mild twitch Lt infraspinatus STM Lt posterior shoulder after DN AA/ROM Lt shoulder prone for functional IR and shoulder extension x 5 reps each Moist heat Lt shoulder end of session x 5'   08/22/2022 Pulleys x 2 min flexion and scaption. Gr 3/4 GH mobs  post and inferior, scapular mobs in right SL Pt in supine for soft tissue mobilization to left UT, Lats, pectorals, scapular area with cocoa butter. Supine wand flexion, scaption, x 5 reps PROM left shoulder flexion, scaption, abd, IR and ER with C-R Sleeper stretch x 4 IR/ER  DATE:  08/20/2022 Shoulder pulleys for flexion and abduction x2 min each Shoulder IR towel stretch 4x15 sec Shoulder ER towel stretch (behind back) 3x15 sec Trigger Point Dry-Needling  Treatment instructions: Expect mild to moderate muscle soreness. S/S of  pneumothorax if dry needled over a lung field, and to seek immediate medical attention should they occur. Patient verbalized understanding of these instructions and education. Patient Consent Given: Yes Education handout provided: Previously provided Muscles treated: left upper trap, left rhomboid, left lats Electrical stimulation performed: No Parameters: N/A Treatment response/outcome: Utilized skilled palpation to identify bony landmarks and trigger points.  Able to illicit twitch response and muscle elongation.  Soft tissue mobilization following to further promote tissue elongation.    08/14/22 Reviewed HEP - Pt doing a variety of AA/ROM exercises.  PT gave options for standing counter "L" stretch for flexion of shoulders, towel stretch for functional IR 5x5" Trigger Point Dry-Needling  Treatment instructions: Expect mild to moderate muscle soreness. S/S of pneumothorax if dry needled over a lung field, and to seek immediate medical attention should they occur. Patient verbalized understanding of these instructions and education.  Patient Consent Given: Yes Education handout provided: No - verbally reviewed Muscles treated: Lt upper trap in prone - Pt did not want to do more than 1 muscle today Electrical stimulation performed: No Parameters:  N/A Treatment response/outcome: deep ache  STM upper trap, infraspinatus, lat, teres minor/major, posterior  deltoid Scapular mobs in prone Supine P/ROM cardinal planes Supine P/ROM with end range joint mobs inf/post for flexion, scaption Gr III/IV Moist heat Lt upper quadrant end of session x 10'   PATIENT EDUCATION:  Access Code: [email or web address] URL: [email or web address] Date: 01/06/2022 Prepared by: Alvira Monday  Exercises - Supine Shoulder Alphabet  - 1 x daily - 7 x weekly - Supine Single Arm Shoulder Protraction  - 1 x daily - 7 x weekly - 10 reps  Education details: theraband exs SR, IR and ER with yellow Person educated: Patient Education method: Explanation, Demonstration, Tactile cues, Verbal cues, and Handouts Education comprehension: verbalized understanding, returned demonstration, verbal cues required, and needs further education    HOME EXERCISE PROGRAM: Access Code: [email or web address] URL: [email or web address] Date: 08/28/2021 Prepared by: Gwenevere Abbot  Exercises - Serratus Forearm Push Up Plus at Wall with Elbows - 1 x daily - 3 x weekly - 1 sets - 10 reps - no hold - Standing Shoulder Row with Anchored Resistance - 1 x daily - 3 x weekly - 1-3 sets - 10 reps - 3sec hold - Shoulder extension with resistance - Neutral - 1 x daily - 3 x weekly - 1-3 sets - 10 reps - 3sec hold - Standing Shoulder External Rotation with Resistance - 1 x daily - 3 x weekly - 1-3 sets - 10 reps - 3sec hold - Single Arm Doorway Pec Stretch at 90 Degrees Abduction - 1-2 x daily - 7 x weekly - 2 reps - 30-60sec hold  ASSESSMENT:   CLINICAL IMPRESSION: Pt is starting off at an improved ROM, and had better tolerance for stretching today. Still feels a pinching sensation at times despite depressing shoulder blade.   OBJECTIVE IMPAIRMENTS decreased knowledge of use of DME, decreased ROM, Decreased strength and increased edema.    ACTIVITY LIMITATIONS community activity and yard work. , overhead reaching activities   PERSONAL FACTORS 1 comorbidity: hematoma complication  are also  affecting patient's functional outcome.      REHAB POTENTIAL: Excellent   CLINICAL DECISION MAKING: Stable/uncomplicated   EVALUATION COMPLEXITY: Low  GOALS: Goals reviewed with patient? Yes     LONG TERM GOALS: Target date: 05/23/2022/   carry over and revised prn   09/26/2022   Pt will improve Lt UE AROM to equal to  the Rt UE and without pain. Goal revised : ROM within 10 degrees of Right UE for improved reaching Baseline:  Goal status: ONGOING   2.  Pt will be ind with self care regarding Lt breast hematoma including self massage, compression bra, and use of foam or swell spot Baseline:  Goal status: MET - pt reports it has softened but it is still there  3. Pt will not demonstrate shooting breast  pains to allow improved comfort.  Baseline:  Goals status: MET if not sleeping on left side  4. Pt will be independent in HEP for left shoulder ROM and in particular strengthening             GOAL STATUS : Ongoing  5. Pt will improve shoulder flexion and abduction to atleast 120 degrees for improved reaching  Goal status   Revised from 150 (08/01/22)  6. Pt will have decreased left shoulder pain no greater than 2/10  7. Pt will be able to fasten bra behind back  Goal Status: New  PLAN: PT FREQUENCY: 2x/week   PT DURATION: 8 weeks    PLANNED INTERVENTIONS: Therapeutic exercises, Patient/Family education, Joint mobilization, DME instructions, Manual lymph drainage, Compression bandaging, scar mobilization, Dry Needling Taping, and Manual therapy, iontophoresis with dexamethasone*   PLAN FOR NEXT SESSION:  f/u on DN to Lt infraspinatus and lat, (pt now with symptoms of Left Frozen shoulder, continue adding light resistance to Lt shoulder ROM/strength, (RECENT INJECTION 07/11/22), Dry Needling,STM/cupping, scapular and GH mob, working on increase A/PROM and decreasing shoulder pain with overhead reach; PROM,MLD lateral trunk prn, progress  to strength as ROM improves. Pt is an  endocrinologist with a tight schedule. Can keep treatment numbers etc as normal and carry forward previous notes. We are co-treating CA and ortho with same goals.Alvira Monday, PT 08/29/22 10:57 AM    [facility] [address], [address] [place], [place] [address] Phone # [phone] Fax [phone]

## 2022-09-03 ENCOUNTER — Encounter: Payer: Self-pay | Admitting: Physical Therapy

## 2022-09-03 ENCOUNTER — Ambulatory Visit: Payer: 59 | Attending: Radiation Oncology | Admitting: Physical Therapy

## 2022-09-03 DIAGNOSIS — R252 Cramp and spasm: Secondary | ICD-10-CM | POA: Diagnosis present

## 2022-09-03 DIAGNOSIS — M25612 Stiffness of left shoulder, not elsewhere classified: Secondary | ICD-10-CM | POA: Diagnosis present

## 2022-09-03 DIAGNOSIS — N6489 Other specified disorders of breast: Secondary | ICD-10-CM | POA: Diagnosis present

## 2022-09-03 DIAGNOSIS — D0512 Intraductal carcinoma in situ of left breast: Secondary | ICD-10-CM | POA: Insufficient documentation

## 2022-09-03 DIAGNOSIS — M7981 Nontraumatic hematoma of soft tissue: Secondary | ICD-10-CM | POA: Diagnosis present

## 2022-09-03 NOTE — Therapy (Signed)
OUTPATIENT PHYSICAL THERAPY TREATMENT NOTE   Patient Name: Beth Stephens MRN: [id number] DOB:07-01-1975, 47 y.o., female Today's Date: 09/03/2022  PCP: Irena Reichmann, DO REFERRING PROVIDER: Lonie Peak, MD  END OF SESSION:   PT End of Session - 09/03/22 0855     Visit Number 34    Number of Visits 43    Date for PT Re-Evaluation 09/26/22    Authorization Type UHC    Authorization - Visit Number 34    Authorization - Number of Visits 60    PT Start Time [phone]   pt late   PT Stop Time 0940    PT Time Calculation (min) 48 min    Activity Tolerance Patient tolerated treatment well    Behavior During Therapy WFL for tasks assessed/performed                      Past Medical History:  Diagnosis Date   Cancer (HCC)    Breast Cancer   Colitis    Colitis    Past Surgical History:  Procedure Laterality Date   BREAST LUMPECTOMY WITH RADIOACTIVE SEED LOCALIZATION Left 05/01/2021   Procedure: LEFT BREAST LUMPECTOMY WITH RADIOACTIVE SEED LOCALIZATION;  Surgeon: Almond Lint, MD;  Location: [facility] OR;  Service: General;  Laterality: Left;   COLONOSCOPY     WISDOM TOOTH EXTRACTION     Patient Active Problem List   Diagnosis Date Noted   Cancer of left breast (HCC) 11/25/2021   Iron deficiency anemia due to chronic blood loss 05/24/2021   Genetic testing 04/22/2021   Ductal carcinoma in situ (DCIS) of left breast 04/08/2021    REFERRING DIAG: DCIS Lt breast  THERAPY DIAG:  Stiffness of left shoulder, not elsewhere classified  Cramp and spasm  SUBJECTIVE: I don't have any pain - it just pinches in some positions  PERTINENT HISTORY: Left lumpectomy 05/01/21 no SLNB with Dr. Donell Beers.  Large hematoma formation. Radiation completed  PRECAUTIONS: Other: very small lymphedema risk Lt UE and breast due to radiation    Are you having pain? yes-  it is  0/10 at rest in shoulder today  but when I stretch it gets to a pinch. Pain location: anterior/posterior  shoulder Pain description: sharp  Aggravating factors: sleeping on it, reaching overhead, fastening bra now in front because can't do behind Relieving factors: rest   OBJECTIVE:  PALPATION: Very hard mandarin orange size hematoma under lateral breast incision, +2 ttp axillary palpation with some puffiness here.  Overall breast not swollen.   12/23/2021 Hematoma is softer with less defined borders, pt is very tender today at lateral lower ribcage   OBSERVATIONS / OTHER ASSESSMENTS: 07/22/21: radiated field still red and healing especially inferior to breast, and peeling blister area in the axilla.        07/02/21: healing hematoma Lt lateral breast, puffiness Lt axilla, wearing soft cotton eye clasp front bra which has no compression but pt reports is comfortable and the only fabric that is okay to wear currently. 11/04/21- hematoma still firm and palpable in left lateral breast, educated pt to try and sleep in her compression bra 08/01/2022; Significant UT compensation with AROM Left shoulder  in standing with poor scapular mobility   UPPER EXTREMITY AROM/PROM:   A/PROM RIGHT  07/02/2021    Shoulder extension 53  Shoulder flexion 174  Shoulder abduction 165  Shoulder internal rotation Behind the back to T6  Shoulder external rotation 90                          (  Blank rows = not tested)   A/PROM LEFT  07/02/2021 07/22/21 08/13/21 08/28/21 11/04/21 12/23/2021 02/28/2022 06/06/2022 08/01/2022 5/16 5/29  Shoulder extension 54    66 60 60 40 32 125 140  Shoulder flexion  145 - ache top of shoulde 145 - ache top of shoulde 155 - with top of the shoulder pinch 155 - no pinch  151 - feels pain coming on 155 pulls at rib 159 95 pinch 82 125 132  Shoulder abduction 160 155 - ache top of the shoulder  155 - ache top of the shoulder  167- no pinch 150 - feels pain coming on 150 with pinch at end range 140 Fear of pinch 72pain 73    Shoulder internal rotation Behind the back to T4     T8 with pinch  Left buttocks Left  buttocks L3 T/L jxn with end range pinch  Shoulder external rotation 90     94 with pinch at endrange   30 supine @ 45                            (Blank rows = not tested)    06/06/2022 PROM left shoulder Flex 115, IR at 45 degrees abd 63, ER at 45 deg 37  08/20/2022: Left shoulder flexion A/ROM 130 degrees (pre-needling), A/ROM 135 degrees after dry needling Left shoulder abduction A/ROM 125 degrees pre-needling, A/ROM 131 degrees after dry needling   TODAY'S TREATMENT   09/03/22: Supine dowel + 3lb bil shoulder flexion x8 Supine T with 2lb dumbbell Lt x6 rounds - VC for scapular setting into table Light green loop Lt lateral and diagonal superior forearm slides for lower trap setting 5x lateral, 5. Diagonal - very fatiguing Lower trap "Y" lift offs from wall x 10 Assessment for TP: Pt is free of TP today Joint mobs at end range Gr II/III/IV with light traction for abduction, IR, ER Moist heat x8' end of session for soreness  08/29/2022 Gr 3 GH mobs post and inferior Supine wand with 2# wt flexion and scaption , ER x 6 reps STM to left pectorals, UT, scapular area and lateral trunk, with cocoa butter and with cupping to pectorals and scapular region, and after stretching STM to left mid delt PROM left shoulder with C-R stretching  08/27/22: Dowel overhead flexion cane +3lb ankle weight x10 hold end range flexion 5-10 sec Supine D2 flexion Lt holding blue plyoball x8 Supine serratus press x8 at 90 deg holding blue plyoball, then CW and CCW circles x10 each way Supine bil ER red tband x10 Trigger Point Dry-Needling  Treatment instructions: Expect mild to moderate muscle soreness. S/S of pneumothorax if dry needled over a lung field, and to seek immediate medical attention should they occur. Patient verbalized understanding of these instructions and education.  Patient Consent Given: Yes Education handout provided: Previously provided Muscles treated: Lt infraspinatus, Lt  lat Electrical stimulation performed: No Parameters: N/A Treatment response/outcome: deep ache, mild twitch Lt infraspinatus STM Lt posterior shoulder after DN AA/ROM Lt shoulder prone for functional IR and shoulder extension x 5 reps each Moist heat Lt shoulder end of session x 5'    PATIENT EDUCATION:  Access Code: [email or web address] URL: [email or web address] Date: 01/06/2022 Prepared by: Alvira Monday  Exercises - Supine Shoulder Alphabet  - 1 x daily - 7 x weekly - Supine Single Arm Shoulder Protraction  - 1 x daily - 7 x weekly - 10 reps  Education details:  theraband exs SR, IR and ER with yellow Person educated: Patient Education method: Explanation, Demonstration, Tactile cues, Verbal cues, and Handouts Education comprehension: verbalized understanding, returned demonstration, verbal cues required, and needs further education    HOME EXERCISE PROGRAM: Access Code: [email or web address] URL: [email or web address] Date: 08/28/2021 Prepared by: Gwenevere Abbot  Exercises - Serratus Forearm Push Up Plus at Wall with Elbows - 1 x daily - 3 x weekly - 1 sets - 10 reps - no hold - Standing Shoulder Row with Anchored Resistance - 1 x daily - 3 x weekly - 1-3 sets - 10 reps - 3sec hold - Shoulder extension with resistance - Neutral - 1 x daily - 3 x weekly - 1-3 sets - 10 reps - 3sec hold - Standing Shoulder External Rotation with Resistance - 1 x daily - 3 x weekly - 1-3 sets - 10 reps - 3sec hold - Single Arm Doorway Pec Stretch at 90 Degrees Abduction - 1-2 x daily - 7 x weekly - 2 reps - 30-60sec hold  ASSESSMENT:   CLINICAL IMPRESSION: Pt continues to maintain gains in A/ROM and presented today without TP in Lt shoulder girdle.  She continues to have capsular tightness in ER, Abd and IR and was able to tolerate end range mobilizations with light traction today.  PT initiated lower trap setting today which was very fatiguing.  She will benefit from continued joint mobs at  end range and progression of scapular mechanics and GH stabilization training.   OBJECTIVE IMPAIRMENTS decreased knowledge of use of DME, decreased ROM, Decreased strength and increased edema.    ACTIVITY LIMITATIONS community activity and yard work. , overhead reaching activities   PERSONAL FACTORS 1 comorbidity: hematoma complication  are also affecting patient's functional outcome.      REHAB POTENTIAL: Excellent   CLINICAL DECISION MAKING: Stable/uncomplicated   EVALUATION COMPLEXITY: Low  GOALS: Goals reviewed with patient? Yes     LONG TERM GOALS: Target date: 05/23/2022/   carry over and revised prn   09/26/2022   Pt will improve Lt UE AROM to equal to the Rt UE and without pain. Goal revised : ROM within 10 degrees of Right UE for improved reaching Baseline:  Goal status: ONGOING   2.  Pt will be ind with self care regarding Lt breast hematoma including self massage, compression bra, and use of foam or swell spot Baseline:  Goal status: MET - pt reports it has softened but it is still there  3. Pt will not demonstrate shooting breast  pains to allow improved comfort.  Baseline:  Goals status: MET if not sleeping on left side  4. Pt will be independent in HEP for left shoulder ROM and in particular strengthening             GOAL STATUS : Ongoing  5. Pt will improve shoulder flexion and abduction to atleast 120 degrees for improved reaching  Goal status   Revised from 150 (08/01/22)  6. Pt will have decreased left shoulder pain no greater than 2/10  7. Pt will be able to fasten bra behind back  Goal Status: New  PLAN: PT FREQUENCY: 2x/week   PT DURATION: 8 weeks    PLANNED INTERVENTIONS: Therapeutic exercises, Patient/Family education, Joint mobilization, DME instructions, Manual lymph drainage, Compression bandaging, scar mobilization, Dry Needling Taping, and Manual therapy, iontophoresis with dexamethasone*   PLAN FOR NEXT SESSION:  DN as needed, joint mobs at  end range for IR/ER/Abd, GH and scapular stabilization, lower trap setting, (  pt now with symptoms of Left Frozen shoulder, continue adding light resistance to Lt shoulder ROM/strength, (RECENT INJECTION 07/11/22), Dry Needling,STM/cupping, scapular and GH mob, working on increase A/PROM and decreasing shoulder pain with overhead reach; PROM,MLD lateral trunk prn, progress  to strength as ROM improves. 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 We are co-treating CA and ortho with same goals.Morton Peters, PT 09/03/22 9:33 AM     [facility] [address], [address] [place], [place] [address] Phone # [phone] Fax [phone]

## 2022-09-05 ENCOUNTER — Ambulatory Visit: Payer: 59

## 2022-09-05 DIAGNOSIS — M7981 Nontraumatic hematoma of soft tissue: Secondary | ICD-10-CM

## 2022-09-05 DIAGNOSIS — N6489 Other specified disorders of breast: Secondary | ICD-10-CM

## 2022-09-05 DIAGNOSIS — M25612 Stiffness of left shoulder, not elsewhere classified: Secondary | ICD-10-CM

## 2022-09-05 DIAGNOSIS — D0512 Intraductal carcinoma in situ of left breast: Secondary | ICD-10-CM

## 2022-09-05 DIAGNOSIS — R252 Cramp and spasm: Secondary | ICD-10-CM

## 2022-09-05 NOTE — Therapy (Signed)
OUTPATIENT PHYSICAL THERAPY TREATMENT NOTE   Patient Name: Beth Stephens MRN: [id number] DOB:05-01-1975, 47 y.o., female Today's Date: 09/05/2022  PCP: Irena Reichmann, DO REFERRING PROVIDER: Lonie Peak, MD  END OF SESSION:   PT End of Session - 09/05/22 0859     Visit Number 35    Number of Visits 43    Date for PT Re-Evaluation 09/26/22    Authorization Type U[employer]    Authorization - Visit Number 35    PT Start Time 0901    PT Stop Time 0957    PT Time Calculation (min) 56 min    Activity Tolerance Patient tolerated treatment well    Behavior During Therapy WFL for tasks assessed/performed                      Past Medical History:  Diagnosis Date   Cancer (HCC)    Breast Cancer   Colitis    Colitis    Past Surgical History:  Procedure Laterality Date   BREAST LUMPECTOMY WITH RADIOACTIVE SEED LOCALIZATION Left 05/01/2021   Procedure: LEFT BREAST LUMPECTOMY WITH RADIOACTIVE SEED LOCALIZATION;  Surgeon: Almond Lint, MD;  Location: [facility] OR;  Service: General;  Laterality: Left;   COLONOSCOPY     WISDOM TOOTH EXTRACTION     Patient Active Problem List   Diagnosis Date Noted   Cancer of left breast (HCC) 11/25/2021   Iron deficiency anemia due to chronic blood loss 05/24/2021   Genetic testing 04/22/2021   Ductal carcinoma in situ (DCIS) of left breast 04/08/2021    REFERRING DIAG: DCIS Lt breast  THERAPY DIAG:  Stiffness of left shoulder, not elsewhere classified  Cramp and spasm  Breast edema  Nontraumatic hematoma of soft tissue  Ductal carcinoma in situ (DCIS) of left breast  SUBJECTIVE: I ffeel like I am doing better. No pain except at end ranges it still pinches.  PERTINENT HISTORY: Left lumpectomy 05/01/21 no SLNB with Dr. Donell Beers.  Large hematoma formation. Radiation completed  PRECAUTIONS: Other: very small lymphedema risk Lt UE and breast due to radiation    Are you having pain? yes-  it is  0/10 at rest in shoulder today  but when  I stretch it gets to a pinch. Pain location: anterior/posterior shoulder Pain description: sharp  Aggravating factors: sleeping on it, reaching overhead, fastening bra now in front because can't do behind Relieving factors: rest   OBJECTIVE:  PALPATION: Very hard mandarin orange size hematoma under lateral breast incision, +2 ttp axillary palpation with some puffiness here.  Overall breast not swollen.   12/23/2021 Hematoma is softer with less defined borders, pt is very tender today at lateral lower ribcage   OBSERVATIONS / OTHER ASSESSMENTS: 07/22/21: radiated field still red and healing especially inferior to breast, and peeling blister area in the axilla.        07/02/21: healing hematoma Lt lateral breast, puffiness Lt axilla, wearing soft cotton eye clasp front bra which has no compression but pt reports is comfortable and the only fabric that is okay to wear currently. 11/04/21- hematoma still firm and palpable in left lateral breast, educated pt to try and sleep in her compression bra 08/01/2022; Significant UT compensation with AROM Left shoulder  in standing with poor scapular mobility   UPPER EXTREMITY AROM/PROM:   A/PROM RIGHT  07/02/2021    Shoulder extension 53  Shoulder flexion 174  Shoulder abduction 165  Shoulder internal rotation Behind the back to T6  Shoulder external rotation 90                          (  Blank rows = not tested)   A/PROM LEFT  07/02/2021 07/22/21 08/13/21 08/28/21 11/04/21 12/23/2021 02/28/2022 06/06/2022 08/01/2022 5/16 5/29  Shoulder extension 54    66 60 60 40 32 125 140  Shoulder flexion  145 - ache top of shoulde 145 - ache top of shoulde 155 - with top of the shoulder pinch 155 - no pinch  151 - feels pain coming on 155 pulls at rib 159 95 pinch 82 125 132  Shoulder abduction 160 155 - ache top of the shoulder  155 - ache top of the shoulder  167- no pinch 150 - feels pain coming on 150 with pinch at end range 140 Fear of pinch 72pain 73    Shoulder internal  rotation Behind the back to T4     T8 with pinch  Left buttocks Left buttocks L3 T/L jxn with end range pinch  Shoulder external rotation 90     94 with pinch at endrange   30 supine @ 45                            (Blank rows = not tested)    06/06/2022 PROM left shoulder Flex 115, IR at 45 degrees abd 63, ER at 45 deg 37  08/20/2022: Left shoulder flexion A/ROM 130 degrees (pre-needling), A/ROM 135 degrees after dry needling Left shoulder abduction A/ROM 125 degrees pre-needling, A/ROM 131 degrees after dry needling   TODAY'S TREATMENT   09/05/2022 Gr 3 GH mobs post and inferior STM to left pectorals, UT, scapular area and lateral trunk, with cocoa butter and with cupping to pectorals and scapular region, and after stretching STM to left mid delt Supine wand with 3# wt flexion and scaption , ER x 6 reps PROM left shoulder  flex, scaption, abd, IR and ER with C-R stretching Prone ext 1# x 10, prone row 1# x 10, prone flexion 0# x 10, SL ER x 10 1#    09/03/22: Supine dowel + 3lb bil shoulder flexion x8 Supine T with 2lb dumbbell Lt x6 rounds - VC for scapular setting into table Light green loop Lt lateral and diagonal superior forearm slides for lower trap setting 5x lateral, 5. Diagonal - very fatiguing Lower trap "Y" lift offs from wall x 10 Assessment for TP: Pt is free of TP today Joint mobs at end range Gr II/III/IV with light traction for abduction, IR, ER Moist heat x8' end of session for soreness  08/29/2022 Gr 3 GH mobs post and inferior Supine wand with 2# wt flexion and scaption , ER x 6 reps STM to left pectorals, UT, scapular area and lateral trunk, with cocoa butter and with cupping to pectorals and scapular region, and after stretching STM to left mid delt PROM left shoulder with C-R stretching  08/27/22: Dowel overhead flexion cane +3lb ankle weight x10 hold end range flexion 5-10 sec Supine D2 flexion Lt holding blue plyoball x8 Supine serratus press x8 at 90 deg  holding blue plyoball, then CW and CCW circles x10 each way Supine bil ER red tband x10 Trigger Point Dry-Needling  Treatment instructions: Expect mild to moderate muscle soreness. S/S of pneumothorax if dry needled over a lung field, and to seek immediate medical attention should they occur. Patient verbalized understanding of these instructions and education.  Patient Consent Given: Yes Education handout provided: Previously provided Muscles treated: Lt infraspinatus, Lt lat Electrical stimulation performed: No Parameters: N/A Treatment response/outcome: deep ache,  mild twitch Lt infraspinatus STM Lt posterior shoulder after DN AA/ROM Lt shoulder prone for functional IR and shoulder extension x 5 reps each Moist heat Lt shoulder end of session x 5'    PATIENT EDUCATION:  Access Code: [email or web address] URL: [email or web address] Date: 01/06/2022 Prepared by: Alvira Monday  Exercises - Supine Shoulder Alphabet  - 1 x daily - 7 x weekly - Supine Single Arm Shoulder Protraction  - 1 x daily - 7 x weekly - 10 reps  Education details: theraband exs SR, IR and ER with yellow Person educated: Patient Education method: Explanation, Demonstration, Tactile cues, Verbal cues, and Handouts Education comprehension: verbalized understanding, returned demonstration, verbal cues required, and needs further education    HOME EXERCISE PROGRAM: Access Code: [email or web address] URL: [email or web address] Date: 08/28/2021 Prepared by: Gwenevere Abbot  Exercises - Serratus Forearm Push Up Plus at Wall with Elbows - 1 x daily - 3 x weekly - 1 sets - 10 reps - no hold - Standing Shoulder Row with Anchored Resistance - 1 x daily - 3 x weekly - 1-3 sets - 10 reps - 3sec hold - Shoulder extension with resistance - Neutral - 1 x daily - 3 x weekly - 1-3 sets - 10 reps - 3sec hold - Standing Shoulder External Rotation with Resistance - 1 x daily - 3 x weekly - 1-3 sets - 10 reps - 3sec hold -  Single Arm Doorway Pec Stretch at 90 Degrees Abduction - 1-2 x daily - 7 x weekly - 2 reps - 30-60sec hold  ASSESSMENT:   CLINICAL IMPRESSION: Pt continues to have intermittent complaints of "pinching" at end range. She had fewer muscular shortened shortened areas today with manual work and good tolerance for CR stretching. She continues to compensate with UT.   OBJECTIVE IMPAIRMENTS decreased knowledge of use of DME, decreased ROM, Decreased strength and increased edema.    ACTIVITY LIMITATIONS community activity and yard work. , overhead reaching activities   PERSONAL FACTORS 1 comorbidity: hematoma complication  are also affecting patient's functional outcome.      REHAB POTENTIAL: Excellent   CLINICAL DECISION MAKING: Stable/uncomplicated   EVALUATION COMPLEXITY: Low  GOALS: Goals reviewed with patient? Yes     LONG TERM GOALS: Target date: 05/23/2022/   carry over and revised prn   09/26/2022   Pt will improve Lt UE AROM to equal to the Rt UE and without pain. Goal revised : ROM within 10 degrees of Right UE for improved reaching Baseline:  Goal status: ONGOING   2.  Pt will be ind with self care regarding Lt breast hematoma including self massage, compression bra, and use of foam or swell spot Baseline:  Goal status: MET - pt reports it has softened but it is still there  3. Pt will not demonstrate shooting breast  pains to allow improved comfort.  Baseline:  Goals status: MET if not sleeping on left side  4. Pt will be independent in HEP for left shoulder ROM and in particular strengthening             GOAL STATUS : Ongoing  5. Pt will improve shoulder flexion and abduction to atleast 120 degrees for improved reaching  Goal status   Revised from 150 (08/01/22)  6. Pt will have decreased left shoulder pain no greater than 2/10  7. Pt will be able to fasten bra behind back  Goal Status: New  PLAN: PT FREQUENCY: 2x/week   PT DURATION: 8 weeks    PLANNED  INTERVENTIONS: Therapeutic exercises, Patient/Family education, Joint mobilization, DME instructions, Manual lymph drainage, Compression bandaging, scar mobilization, Dry Needling Taping, and Manual therapy, iontophoresis with dexamethasone*   PLAN FOR NEXT SESSION:  DN as needed, joint mobs at end range for IR/ER/Abd, GH and scapular stabilization, lower trap setting, (pt now with symptoms of Left Frozen shoulder, continue adding light resistance to Lt shoulder ROM/strength, (RECENT INJECTION 07/11/22), Dry Needling,STM/cupping, scapular and GH mob, working on increase A/PROM and decreasing shoulder pain with overhead reach; PROM,MLD lateral trunk prn, progress  to strength as ROM improves. 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 We are co-treating CA and ortho with same goals.Alvira Monday, PT 09/05/22 10:00 AM     [facility] [address], [address] [place], [place] [address] Phone # [phone] Fax [phone]

## 2022-09-10 ENCOUNTER — Ambulatory Visit: Payer: 59

## 2022-09-10 DIAGNOSIS — M25612 Stiffness of left shoulder, not elsewhere classified: Secondary | ICD-10-CM | POA: Diagnosis not present

## 2022-09-10 DIAGNOSIS — R252 Cramp and spasm: Secondary | ICD-10-CM

## 2022-09-10 DIAGNOSIS — D0512 Intraductal carcinoma in situ of left breast: Secondary | ICD-10-CM

## 2022-09-10 DIAGNOSIS — N6489 Other specified disorders of breast: Secondary | ICD-10-CM

## 2022-09-10 DIAGNOSIS — M7981 Nontraumatic hematoma of soft tissue: Secondary | ICD-10-CM

## 2022-09-10 NOTE — Therapy (Signed)
OUTPATIENT PHYSICAL THERAPY TREATMENT NOTE   Patient Name: Beth Stephens MRN: [id number] DOB:Feb 11, 1976, 47 y.o., female Today's Date: 09/10/2022  PCP: Irena Reichmann, DO REFERRING PROVIDER: Lonie Peak, MD  END OF SESSION:   PT End of Session - 09/10/22 0918     Visit Number 36    Number of Visits 43    Date for PT Re-Evaluation 09/26/22    Authorization Type UHC    Authorization - Visit Number 36    PT Start Time 0920    PT Stop Time 1001    PT Time Calculation (min) 41 min    Activity Tolerance Patient tolerated treatment well    Behavior During Therapy WFL for tasks assessed/performed                      Past Medical History:  Diagnosis Date   Cancer (HCC)    Breast Cancer   Colitis    Colitis    Past Surgical History:  Procedure Laterality Date   BREAST LUMPECTOMY WITH RADIOACTIVE SEED LOCALIZATION Left 05/01/2021   Procedure: LEFT BREAST LUMPECTOMY WITH RADIOACTIVE SEED LOCALIZATION;  Surgeon: Almond Lint, MD;  Location: [facility] OR;  Service: General;  Laterality: Left;   COLONOSCOPY     WISDOM TOOTH EXTRACTION     Patient Active Problem List   Diagnosis Date Noted   Cancer of left breast (HCC) 11/25/2021   Iron deficiency anemia due to chronic blood loss 05/24/2021   Genetic testing 04/22/2021   Ductal carcinoma in situ (DCIS) of left breast 04/08/2021    REFERRING DIAG: DCIS Lt breast  THERAPY DIAG:  Stiffness of left shoulder, not elsewhere classified  Cramp and spasm  Breast edema  Nontraumatic hematoma of soft tissue  Ductal carcinoma in situ (DCIS) of left breast  SUBJECTIVE: My shoulder isn't too bad. Still get the pinch at times at end ranges. The back motion is better.  PERTINENT HISTORY: Left lumpectomy 05/01/21 no SLNB with Dr. Donell Beers.  Large hematoma formation. Radiation completed  PRECAUTIONS: Other: very small lymphedema risk Lt UE and breast due to radiation    Are you having pain? yes-  it is  0/10 at rest in  shoulder today  but when I stretch it gets to a pinch 3-4/10 Pain location: anterior/posterior shoulder Pain description: sharp  Aggravating factors: sleeping on it, reaching overhead, fastening bra now in front because can't do behind Relieving factors: rest   OBJECTIVE:  PALPATION: Very hard mandarin orange size hematoma under lateral breast incision, +2 ttp axillary palpation with some puffiness here.  Overall breast not swollen.   12/23/2021 Hematoma is softer with less defined borders, pt is very tender today at lateral lower ribcage   OBSERVATIONS / OTHER ASSESSMENTS: 07/22/21: radiated field still red and healing especially inferior to breast, and peeling blister area in the axilla.        07/02/21: healing hematoma Lt lateral breast, puffiness Lt axilla, wearing soft cotton eye clasp front bra which has no compression but pt reports is comfortable and the only fabric that is okay to wear currently. 11/04/21- hematoma still firm and palpable in left lateral breast, educated pt to try and sleep in her compression bra 08/01/2022; Significant UT compensation with AROM Left shoulder  in standing with poor scapular mobility   UPPER EXTREMITY AROM/PROM:   A/PROM RIGHT  07/02/2021    Shoulder extension 53  Shoulder flexion 174  Shoulder abduction 165  Shoulder internal rotation Behind the back to T6  Shoulder external rotation 90                          (Blank rows = not tested)   A/PROM LEFT  07/02/2021 07/22/21 08/13/21 08/28/21 11/04/21 12/23/2021 02/28/2022 06/06/2022 08/01/2022 5/16 5/29  Shoulder extension 54    66 60 60 40 32 125 140  Shoulder flexion  145 - ache top of shoulde 145 - ache top of shoulde 155 - with top of the shoulder pinch 155 - no pinch  151 - feels pain coming on 155 pulls at rib 159 95 pinch 82 125 132  Shoulder abduction 160 155 - ache top of the shoulder  155 - ache top of the shoulder  167- no pinch 150 - feels pain coming on 150 with pinch at end range 140 Fear of pinch 72pain  73    Shoulder internal rotation Behind the back to T4     T8 with pinch  Left buttocks Left buttocks L3 T/L jxn with end range pinch  Shoulder external rotation 90     94 with pinch at endrange   30 supine @ 45                            (Blank rows = not tested)    06/06/2022 PROM left shoulder Flex 115, IR at 45 degrees abd 63, ER at 45 deg 37  08/20/2022: Left shoulder flexion A/ROM 130 degrees (pre-needling), A/ROM 135 degrees after dry needling Left shoulder abduction A/ROM 125 degrees pre-needling, A/ROM 131 degrees after dry needling   TODAY'S TREATMENT  09/10/2022 GH mobs posterior and inf gr 3/4 Supine wand flexion and scaption x 5, ER x 5 PROM left shoulder  flex, scaption, abd, IR and ER with C-R stretching Supine horizontal abd red x 10 Prone ext and row 2# x 10 Wall walks yellow 4 steps x 4 laps Ball rolls on wall 2 x 1o 4 D  09/05/2022 Gr 3 GH mobs post and inferior STM to left pectorals, UT, scapular area and lateral trunk, with cocoa butter and with cupping to pectorals and scapular region, and after stretching STM to left mid delt Supine wand with 3# wt flexion and scaption , ER x 6 reps PROM left shoulder  flex, scaption, abd, IR and ER with C-R stretching Prone ext 1# x 10, prone row 1# x 10, prone flexion 0# x 10, SL ER x 10 1#    09/03/22: Supine dowel + 3lb bil shoulder flexion x8 Supine T with 2lb dumbbell Lt x6 rounds - VC for scapular setting into table Light green loop Lt lateral and diagonal superior forearm slides for lower trap setting 5x lateral, 5. Diagonal - very fatiguing Lower trap "Y" lift offs from wall x 10 Assessment for TP: Pt is free of TP today Joint mobs at end range Gr II/III/IV with light traction for abduction, IR, ER Moist heat x8' end of session for soreness  08/29/2022 Gr 3 GH mobs post and inferior Supine wand with 2# wt flexion and scaption , ER x 6 reps STM to left pectorals, UT, scapular area and lateral trunk, with cocoa butter  and with cupping to pectorals and scapular region, and after stretching STM to left mid delt PROM left shoulder with C-R stretching  08/27/22: Dowel overhead flexion cane +3lb ankle weight x10 hold end range flexion 5-10 sec Supine D2 flexion Lt holding blue plyoball  x8 Supine serratus press x8 at 90 deg holding blue plyoball, then CW and CCW circles x10 each way Supine bil ER red tband x10 Trigger Point Dry-Needling  Treatment instructions: Expect mild to moderate muscle soreness. S/S of pneumothorax if dry needled over a lung field, and to seek immediate medical attention should they occur. Patient verbalized understanding of these instructions and education.  Patient Consent Given: Yes Education handout provided: Previously provided Muscles treated: Lt infraspinatus, Lt lat Electrical stimulation performed: No Parameters: N/A Treatment response/outcome: deep ache, mild twitch Lt infraspinatus STM Lt posterior shoulder after DN AA/ROM Lt shoulder prone for functional IR and shoulder extension x 5 reps each Moist heat Lt shoulder end of session x 5'    PATIENT EDUCATION:  Access Code: [email or web address] URL: [email or web address] Date: 01/06/2022 Prepared by: Alvira Monday  Exercises - Supine Shoulder Alphabet  - 1 x daily - 7 x weekly - Supine Single Arm Shoulder Protraction  - 1 x daily - 7 x weekly - 10 reps  Education details: theraband exs SR, IR and ER with yellow Person educated: Patient Education method: Explanation, Demonstration, Tactile cues, Verbal cues, and Handouts Education comprehension: verbalized understanding, returned demonstration, verbal cues required, and needs further education    HOME EXERCISE PROGRAM: Access Code: [email or web address] URL: [email or web address] Date: 08/28/2021 Prepared by: Gwenevere Abbot  Exercises - Serratus Forearm Push Up Plus at Wall with Elbows - 1 x daily - 3 x weekly - 1 sets - 10 reps - no hold - Standing Shoulder Row  with Anchored Resistance - 1 x daily - 3 x weekly - 1-3 sets - 10 reps - 3sec hold - Shoulder extension with resistance - Neutral - 1 x daily - 3 x weekly - 1-3 sets - 10 reps - 3sec hold - Standing Shoulder External Rotation with Resistance - 1 x daily - 3 x weekly - 1-3 sets - 10 reps - 3sec hold - Single Arm Doorway Pec Stretch at 90 Degrees Abduction - 1-2 x daily - 7 x weekly - 2 reps - 30-60sec hold  ASSESSMENT:   CLINICAL IMPRESSION: Pt continues to have intermittent complaints of "pinching" at end range. Overall good improvement in shoulder ROM especially with functional IR. Marland Kitchen Left arm fatigued after exercises OBJECTIVE IMPAIRMENTS decreased knowledge of use of DME, decreased ROM, Decreased strength and increased edema.    ACTIVITY LIMITATIONS community activity and yard work. , overhead reaching activities   PERSONAL FACTORS 1 comorbidity: hematoma complication  are also affecting patient's functional outcome.      REHAB POTENTIAL: Excellent   CLINICAL DECISION MAKING: Stable/uncomplicated   EVALUATION COMPLEXITY: Low  GOALS: Goals reviewed with patient? Yes     LONG TERM GOALS: Target date: 05/23/2022/   carry over and revised prn   09/26/2022   Pt will improve Lt UE AROM to equal to the Rt UE and without pain. Goal revised : ROM within 10 degrees of Right UE for improved reaching Baseline:  Goal status: ONGOING   2.  Pt will be ind with self care regarding Lt breast hematoma including self massage, compression bra, and use of foam or swell spot Baseline:  Goal status: MET - pt reports it has softened but it is still there  3. Pt will not demonstrate shooting breast  pains to allow improved comfort.  Baseline:  Goals status: MET if not sleeping on left side  4. Pt will be independent in HEP for left shoulder ROM and in particular strengthening  GOAL STATUS : Ongoing  5. Pt will improve shoulder flexion and abduction to atleast 120 degrees for improved  reaching  Goal status   Revised from 150 (08/01/22)  6. Pt will have decreased left shoulder pain no greater than 2/10  7. Pt will be able to fasten bra behind back  Goal Status: New  PLAN: PT FREQUENCY: 2x/week   PT DURATION: 8 weeks    PLANNED INTERVENTIONS: Therapeutic exercises, Patient/Family education, Joint mobilization, DME instructions, Manual lymph drainage, Compression bandaging, scar mobilization, Dry Needling Taping, and Manual therapy, iontophoresis with dexamethasone*   PLAN FOR NEXT SESSION:  DN as needed, joint mobs at end range for IR/ER/Abd, GH and scapular stabilization, lower trap setting, (pt now with symptoms of Left Frozen shoulder, continue adding light resistance to Lt shoulder ROM/strength, (RECENT INJECTION 07/11/22), Dry Needling,STM/cupping, scapular and GH mob, working on increase A/PROM and decreasing shoulder pain with overhead reach; PROM,MLD lateral trunk prn, progress  to strength as ROM improves. 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 We are co-treating CA and ortho with same goals.Alvira Monday, PT 09/10/22 10:02 AM     [facility] [address] [place], [place] [address] Phone # [phone] Fax [phone]

## 2022-09-19 ENCOUNTER — Ambulatory Visit: Payer: 59

## 2022-09-19 ENCOUNTER — Encounter: Payer: 59 | Admitting: Rehabilitative and Restorative Service Providers"

## 2022-09-19 DIAGNOSIS — R252 Cramp and spasm: Secondary | ICD-10-CM

## 2022-09-19 DIAGNOSIS — D0512 Intraductal carcinoma in situ of left breast: Secondary | ICD-10-CM

## 2022-09-19 DIAGNOSIS — M7981 Nontraumatic hematoma of soft tissue: Secondary | ICD-10-CM

## 2022-09-19 DIAGNOSIS — M25612 Stiffness of left shoulder, not elsewhere classified: Secondary | ICD-10-CM

## 2022-09-19 DIAGNOSIS — N6489 Other specified disorders of breast: Secondary | ICD-10-CM

## 2022-09-19 NOTE — Therapy (Signed)
OUTPATIENT PHYSICAL THERAPY TREATMENT NOTE   Patient Name: Beth Stephens MRN: [id number] DOB:11/13/1975, 47 y.o., female Today's Date: 09/19/2022  PCP: Irena Reichmann, DO REFERRING PROVIDER: Lonie Peak, MD  END OF SESSION:   PT End of Session - 09/19/22 1010     Visit Number 37    Number of Visits 43    Date for PT Re-Evaluation 09/26/22    Authorization Type [employer]    Authorization - Visit Number 37    PT Start Time 1011    PT Stop Time 1056    PT Time Calculation (min) 45 min    Activity Tolerance Patient tolerated treatment well    Behavior During Therapy WFL for tasks assessed/performed                      Past Medical History:  Diagnosis Date   Cancer (HCC)    Breast Cancer   Colitis    Colitis    Past Surgical History:  Procedure Laterality Date   BREAST LUMPECTOMY WITH RADIOACTIVE SEED LOCALIZATION Left 05/01/2021   Procedure: LEFT BREAST LUMPECTOMY WITH RADIOACTIVE SEED LOCALIZATION;  Surgeon: Almond Lint, MD;  Location: [facility] OR;  Service: General;  Laterality: Left;   COLONOSCOPY     WISDOM TOOTH EXTRACTION     Patient Active Problem List   Diagnosis Date Noted   Cancer of left breast (HCC) 11/25/2021   Iron deficiency anemia due to chronic blood loss 05/24/2021   Genetic testing 04/22/2021   Ductal carcinoma in situ (DCIS) of left breast 04/08/2021    REFERRING DIAG: DCIS Lt breast  THERAPY DIAG:  Stiffness of left shoulder, not elsewhere classified  Cramp and spasm  Breast edema  Nontraumatic hematoma of soft tissue  Ductal carcinoma in situ (DCIS) of left breast  SUBJECTIVE: I am doing well but still getting pinching. I can reach behind better now.  PERTINENT HISTORY: Left lumpectomy 05/01/21 no SLNB with Dr. Donell Beers.  Large hematoma formation. Radiation completed  PRECAUTIONS: Other: very small lymphedema risk Lt UE and breast due to radiation    Are you having pain? yes-  it is  0/10 at rest in shoulder today  but when I  stretch it gets to a pinch 3-4/10 Pain location: anterior/posterior shoulder Pain description: sharp  Aggravating factors: sleeping on it, reaching overhead, fastening bra now in front because can't do behind Relieving factors: rest   OBJECTIVE:  PALPATION: Very hard mandarin orange size hematoma under lateral breast incision, +2 ttp axillary palpation with some puffiness here.  Overall breast not swollen.   12/23/2021 Hematoma is softer with less defined borders, pt is very tender today at lateral lower ribcage   OBSERVATIONS / OTHER ASSESSMENTS: 07/22/21: radiated field still red and healing especially inferior to breast, and peeling blister area in the axilla.        07/02/21: healing hematoma Lt lateral breast, puffiness Lt axilla, wearing soft cotton eye clasp front bra which has no compression but pt reports is comfortable and the only fabric that is okay to wear currently. 11/04/21- hematoma still firm and palpable in left lateral breast, educated pt to try and sleep in her compression bra 08/01/2022; Significant UT compensation with AROM Left shoulder  in standing with poor scapular mobility   UPPER EXTREMITY AROM/PROM:   A/PROM RIGHT  07/02/2021    Shoulder extension 53  Shoulder flexion 174  Shoulder abduction 165  Shoulder internal rotation Behind the back to T6  Shoulder external rotation 90                          (  Blank rows = not tested)   A/PROM LEFT  07/02/2021 07/22/21 08/13/21 08/28/21 11/04/21 12/23/2021 02/28/2022 06/06/2022 08/01/2022 5/16 5/29  Shoulder extension 54    66 60 60 40 32 125 140  Shoulder flexion  145 - ache top of shoulde 145 - ache top of shoulde 155 - with top of the shoulder pinch 155 - no pinch  151 - feels pain coming on 155 pulls at rib 159 95 pinch 82 125 132  Shoulder abduction 160 155 - ache top of the shoulder  155 - ache top of the shoulder  167- no pinch 150 - feels pain coming on 150 with pinch at end range 140 Fear of pinch 72pain 73    Shoulder internal  rotation Behind the back to T4     T8 with pinch  Left buttocks Left buttocks L3 T/L jxn with end range pinch  Shoulder external rotation 90     94 with pinch at endrange   30 supine @ 45                            (Blank rows = not tested)    06/06/2022 PROM left shoulder Flex 115, IR at 45 degrees abd 63, ER at 45 deg 37  08/20/2022: Left shoulder flexion A/ROM 130 degrees (pre-needling), A/ROM 135 degrees after dry needling Left shoulder abduction A/ROM 125 degrees pre-needling, A/ROM 131 degrees after dry needling   TODAY'S TREATMENT   09/19/22  GH mobs posterior and inf gr 3/4 Supine wand flexion and scaption x 5, ER x 5 STM Left UT, pectorals, lateral trunk and scapular region;cupping to lateral trunk and scapular region. PROM left shoulder  flex, scaption, abd, IR and ER with C-R stretching Supine on half foam roll bilateral flexion, scaption, horizontal abd x 5 Showed single arm pec stretch x 3  09/10/2022 GH mobs posterior and inf gr 3/4 Supine wand flexion and scaption x 5, ER x 5 PROM left shoulder  flex, scaption, abd, IR and ER with C-R stretching Supine horizontal abd red x 10 Prone ext and row 2# x 10 Wall walks yellow 4 steps x 4 laps Ball rolls on wall 2 x 1o 4 D  09/05/2022 Gr 3 GH mobs post and inferior STM to left pectorals, UT, scapular area and lateral trunk, with cocoa butter and with cupping to pectorals and scapular region, and after stretching STM to left mid delt Supine wand with 3# wt flexion and scaption , ER x 6 reps PROM left shoulder  flex, scaption, abd, IR and ER with C-R stretching Prone ext 1# x 10, prone row 1# x 10, prone flexion 0# x 10, SL ER x 10 1#    09/03/22: Supine dowel + 3lb bil shoulder flexion x8 Supine T with 2lb dumbbell Lt x6 rounds - VC for scapular setting into table Light green loop Lt lateral and diagonal superior forearm slides for lower trap setting 5x lateral, 5. Diagonal - very fatiguing Lower trap "Y" lift offs from wall  x 10 Assessment for TP: Pt is free of TP today Joint mobs at end range Gr II/III/IV with light traction for abduction, IR, ER Moist heat x8' end of session for soreness  08/29/2022 Gr 3 GH mobs post and inferior Supine wand with 2# wt flexion and scaption , ER x 6 reps STM to left pectorals, UT, scapular area and lateral trunk, with cocoa butter and with cupping to pectorals and  scapular region, and after stretching STM to left mid delt PROM left shoulder with C-R stretching  08/27/22: Dowel overhead flexion cane +3lb ankle weight x10 hold end range flexion 5-10 sec Supine D2 flexion Lt holding blue plyoball x8 Supine serratus press x8 at 90 deg holding blue plyoball, then CW and CCW circles x10 each way Supine bil ER red tband x10 Trigger Point Dry-Needling  Treatment instructions: Expect mild to moderate muscle soreness. S/S of pneumothorax if dry needled over a lung field, and to seek immediate medical attention should they occur. Patient verbalized understanding of these instructions and education.  Patient Consent Given: Yes Education handout provided: Previously provided Muscles treated: Lt infraspinatus, Lt lat Electrical stimulation performed: No Parameters: N/A Treatment response/outcome: deep ache, mild twitch Lt infraspinatus STM Lt posterior shoulder after DN AA/ROM Lt shoulder prone for functional IR and shoulder extension x 5 reps each Moist heat Lt shoulder end of session x 5'    PATIENT EDUCATION:  Access Code: [email or web address] URL: [email or web address] Date: 01/06/2022 Prepared by: Alvira Monday  Exercises - Supine Shoulder Alphabet  - 1 x daily - 7 x weekly - Supine Single Arm Shoulder Protraction  - 1 x daily - 7 x weekly - 10 reps  Education details: theraband exs SR, IR and ER with yellow Person educated: Patient Education method: Explanation, Demonstration, Tactile cues, Verbal cues, and Handouts Education comprehension: verbalized understanding,  returned demonstration, verbal cues required, and needs further education    HOME EXERCISE PROGRAM: Access Code: [email or web address] URL: [email or web address] Date: 08/28/2021 Prepared by: Gwenevere Abbot  Exercises - Serratus Forearm Push Up Plus at Wall with Elbows - 1 x daily - 3 x weekly - 1 sets - 10 reps - no hold - Standing Shoulder Row with Anchored Resistance - 1 x daily - 3 x weekly - 1-3 sets - 10 reps - 3sec hold - Shoulder extension with resistance - Neutral - 1 x daily - 3 x weekly - 1-3 sets - 10 reps - 3sec hold - Standing Shoulder External Rotation with Resistance - 1 x daily - 3 x weekly - 1-3 sets - 10 reps - 3sec hold - Single Arm Doorway Pec Stretch at 90 Degrees Abduction - 1-2 x daily - 7 x weekly - 2 reps - 30-60sec hold  ASSESSMENT:   CLINICAL IMPRESSION: Pt continues to have intermittent complaints of "pinching" at end range of motion. Very good improvement with IR and ER. Still weak in LT and with UT compensation with reaching.  OBJECTIVE IMPAIRMENTS decreased knowledge of use of DME, decreased ROM, Decreased strength and increased edema.    ACTIVITY LIMITATIONS community activity and yard work. , overhead reaching activities   PERSONAL FACTORS 1 comorbidity: hematoma complication  are also affecting patient's functional outcome.      REHAB POTENTIAL: Excellent   CLINICAL DECISION MAKING: Stable/uncomplicated   EVALUATION COMPLEXITY: Low  GOALS: Goals reviewed with patient? Yes     LONG TERM GOALS: Target date: 05/23/2022/   carry over and revised prn   09/26/2022   Pt will improve Lt UE AROM to equal to the Rt UE and without pain. Goal revised : ROM within 10 degrees of Right UE for improved reaching Baseline:  Goal status: ONGOING   2.  Pt will be ind with self care regarding Lt breast hematoma including self massage, compression bra, and use of foam or swell spot Baseline:  Goal status: MET - pt reports it has softened but it is still  there  3.  Pt will not demonstrate shooting breast  pains to allow improved comfort.  Baseline:  Goals status: MET if not sleeping on left side  4. Pt will be independent in HEP for left shoulder ROM and in particular strengthening             GOAL STATUS : Ongoing  5. Pt will improve shoulder flexion and abduction to atleast 120 degrees for improved reaching  Goal status   Revised from 150 (08/01/22)  6. Pt will have decreased left shoulder pain no greater than 2/10  7. Pt will be able to fasten bra behind back  Goal Status: New  PLAN: PT FREQUENCY: 2x/week   PT DURATION: 8 weeks    PLANNED INTERVENTIONS: Therapeutic exercises, Patient/Family education, Joint mobilization, DME instructions, Manual lymph drainage, Compression bandaging, scar mobilization, Dry Needling Taping, and Manual therapy, iontophoresis with dexamethasone*   PLAN FOR NEXT SESSION:  DN as needed, joint mobs at end range for IR/ER/Abd, GH and scapular stabilization, lower trap setting, (pt now with symptoms of Left Frozen shoulder, continue adding light resistance to Lt shoulder ROM/strength, (RECENT INJECTION 07/11/22), Dry Needling,STM/cupping, scapular and GH mob, working on increase A/PROM and decreasing shoulder pain with overhead reach; PROM,MLD lateral trunk prn, progress  to strength as ROM improves. 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 We are co-treating CA and ortho with same goals.Alvira Monday, PT 09/19/22 11:05 AM     [facility] [address] [place], [place] [address] Phone # [phone] Fax [phone]

## 2022-10-09 ENCOUNTER — Ambulatory Visit: Payer: 59 | Admitting: Physical Therapy

## 2022-10-10 ENCOUNTER — Ambulatory Visit: Payer: 59 | Attending: Radiation Oncology

## 2022-10-10 DIAGNOSIS — M7981 Nontraumatic hematoma of soft tissue: Secondary | ICD-10-CM | POA: Insufficient documentation

## 2022-10-10 DIAGNOSIS — R252 Cramp and spasm: Secondary | ICD-10-CM | POA: Insufficient documentation

## 2022-10-10 DIAGNOSIS — N6489 Other specified disorders of breast: Secondary | ICD-10-CM | POA: Insufficient documentation

## 2022-10-10 DIAGNOSIS — M25612 Stiffness of left shoulder, not elsewhere classified: Secondary | ICD-10-CM | POA: Insufficient documentation

## 2022-10-10 DIAGNOSIS — D0512 Intraductal carcinoma in situ of left breast: Secondary | ICD-10-CM | POA: Insufficient documentation

## 2022-10-14 ENCOUNTER — Encounter: Payer: 59 | Admitting: Physical Therapy

## 2022-10-17 ENCOUNTER — Ambulatory Visit: Payer: 59

## 2022-10-17 DIAGNOSIS — M7981 Nontraumatic hematoma of soft tissue: Secondary | ICD-10-CM | POA: Diagnosis present

## 2022-10-17 DIAGNOSIS — R252 Cramp and spasm: Secondary | ICD-10-CM | POA: Diagnosis present

## 2022-10-17 DIAGNOSIS — M25612 Stiffness of left shoulder, not elsewhere classified: Secondary | ICD-10-CM

## 2022-10-17 DIAGNOSIS — D0512 Intraductal carcinoma in situ of left breast: Secondary | ICD-10-CM

## 2022-10-17 DIAGNOSIS — N6489 Other specified disorders of breast: Secondary | ICD-10-CM | POA: Diagnosis present

## 2022-10-17 NOTE — Therapy (Signed)
OUTPATIENT PHYSICAL THERAPY TREATMENT NOTE   Patient Name: Beth Stephens MRN: [id number] DOB:06-17-1975, 47 y.o., female Today's Date: 10/17/2022  PCP: Irena Reichmann, DO REFERRING PROVIDER: Lonie Peak, MD  END OF SESSION:   PT End of Session - 10/17/22 1006     Visit Number 38    Number of Visits 43    Date for PT Re-Evaluation 12/19/22    Authorization - Visit Number 38    Authorization - Number of Visits 60    PT Start Time 1007    PT Stop Time 1101    PT Time Calculation (min) 54 min    Activity Tolerance Patient tolerated treatment well    Behavior During Therapy WFL for tasks assessed/performed                       Past Medical History:  Diagnosis Date   Cancer (HCC)    Breast Cancer   Colitis    Colitis    Past Surgical History:  Procedure Laterality Date   BREAST LUMPECTOMY WITH RADIOACTIVE SEED LOCALIZATION Left 05/01/2021   Procedure: LEFT BREAST LUMPECTOMY WITH RADIOACTIVE SEED LOCALIZATION;  Surgeon: Almond Lint, MD;  Location: [facility] OR;  Service: General;  Laterality: Left;   COLONOSCOPY     WISDOM TOOTH EXTRACTION     Patient Active Problem List   Diagnosis Date Noted   Cancer of left breast (HCC) 11/25/2021   Iron deficiency anemia due to chronic blood loss 05/24/2021   Genetic testing 04/22/2021   Ductal carcinoma in situ (DCIS) of left breast 04/08/2021    REFERRING DIAG: DCIS Lt breast  THERAPY DIAG:  Stiffness of left shoulder, not elsewhere classified  Cramp and spasm  Breast edema  Nontraumatic hematoma of soft tissue  Ductal carcinoma in situ (DCIS) of left breast  SUBJECTIVE: I didn't get to do many exercises while I was on my trip, and then I sick when I got home. I have started back to yoga and zumba  PERTINENT HISTORY: Left lumpectomy 05/01/21 no SLNB with Dr. Donell Beers.  Large hematoma formation. Radiation completed  PRECAUTIONS: Other: very small lymphedema risk Lt UE and breast due to radiation    Are you  having pain? yes-  it is  0/10 at rest in shoulder today  but when I stretch it gets to a pinch 3-4/10 Pain location: anterior/posterior shoulder Pain description: sharp  Aggravating factors: sleeping on it, reaching overhead, fastening bra now in front because can't do behind Relieving factors: rest   OBJECTIVE:  PALPATION: Very hard mandarin orange size hematoma under lateral breast incision, +2 ttp axillary palpation with some puffiness here.  Overall breast not swollen.   12/23/2021 Hematoma is softer with less defined borders, pt is very tender today at lateral lower ribcage   OBSERVATIONS / OTHER ASSESSMENTS: 07/22/21: radiated field still red and healing especially inferior to breast, and peeling blister area in the axilla.        07/02/21: healing hematoma Lt lateral breast, puffiness Lt axilla, wearing soft cotton eye clasp front bra which has no compression but pt reports is comfortable and the only fabric that is okay to wear currently. 11/04/21- hematoma still firm and palpable in left lateral breast, educated pt to try and sleep in her compression bra 08/01/2022; Significant UT compensation with AROM Left shoulder  in standing with poor scapular mobility   UPPER EXTREMITY AROM/PROM:   A/PROM RIGHT  07/02/2021    Shoulder extension 53  Shoulder flexion 174  Shoulder abduction 165  Shoulder internal rotation Behind the back to T6  Shoulder external rotation 90                          (Blank rows = not tested)   A/PROM LEFT  07/02/2021 07/22/21 08/13/21 08/28/21 11/04/21 12/23/2021 02/28/2022 06/06/2022 08/01/2022 5/16 5/29 7/19 LEFT  Shoulder extension 54    66 60 60 40 32 125 140 EXT 60  Shoulder flexion  145 - ache top of shoulde 145 - ache top of shoulde 155 - with top of the shoulder pinch 155 - no pinch  151 - feels pain coming on 155 pulls at rib 159 95 pinch 82 125 132 FLEX: 149 no pinch  Shoulder abduction 160 155 - ache top of the shoulder  155 - ache top of the shoulder  167- no pinch  150 - feels pain coming on 150 with pinch at end range 140 Fear of pinch 72pain 73   ABD:152  Shoulder internal rotation Behind the back to T4     T8 with pinch  Left buttocks Left buttocks L3 T/L jxn with end range pinch IR BEHIND BACK:T7  Shoulder external rotation 90     94 with pinch at endrange   30 supine @ 45   ER  :94                          (Blank rows = not tested) UPPER EXTREMITY ROM:  AROM    Shoulder extension    Shoulder flexion    Shoulder abduction    Shoulder extension    Shoulder internal rotation    Shoulder external rotation    Elbow flexion    Elbow extension     (Blank rows = not tested)    STRENGTH: 10/17/2022 Left UE: Flex and 3+-4-/5 ABD: 3+/5 IR;4/5 ER 3+/5  06/06/2022 PROM left shoulder Flex 115, IR at 45 degrees abd 63, ER at 45 deg 37  08/20/2022: Left shoulder flexion A/ROM 130 degrees (pre-needling), A/ROM 135 degrees after dry needling Left shoulder abduction A/ROM 125 degrees pre-needling, A/ROM 131 degrees after dry needling   TODAY'S TREATMENT   10/17/2022 Assessed AROM of left UE, and strength Instructed left UE strength as follows Prone extension, row, 1# x 10 ea Horizontal abd x 10 0# SL ER 1 # x 15 Standing jobes 1# flexion and scaption in front of mirror x 10 ea #2 red weighted ball rolls x 10 4D for scap stab Updated HEP with Medbridge pictures but unable to copy into chart Pt educated how to progress reps/resistance and given ABC strength handout with instructions 09/19/22  GH mobs posterior and inf gr 3/4 Supine wand flexion and scaption x 5, ER x 5 STM Left UT, pectorals, lateral trunk and scapular region;cupping to lateral trunk and scapular region. PROM left shoulder  flex, scaption, abd, IR and ER with C-R stretching Supine on half foam roll bilateral flexion, scaption, horizontal abd x 5 Showed single arm pec stretch x 3    09/03/22: Supine dowel + 3lb bil shoulder flexion x8 Supine T with 2lb dumbbell Lt x6 rounds -  VC for scapular setting into table Light green loop Lt lateral and diagonal superior forearm slides for lower trap setting 5x lateral, 5. Diagonal - very fatiguing Lower trap "Y" lift offs from wall x 10 Assessment for TP: Pt is free of TP today Joint mobs at  end range Gr II/III/IV with light traction for abduction, IR, ER Moist heat x8' end of session for soreness   PATIENT EDUCATION:   10/17/2022 Kneeling ext, row, horizontal abd, SL ER, standing jobes flexion, scaption to shoulder height; Medbridge would not copy ABC Breast Cancer strength handout  Access Code: [email or web address] URL: [email or web address] Date: 01/06/2022 Prepared by: Alvira Monday  Exercises - Supine Shoulder Alphabet  - 1 x daily - 7 x weekly - Supine Single Arm Shoulder Protraction  - 1 x daily - 7 x weekly - 10 reps  Education details: theraband exs SR, IR and ER with yellow Person educated: Patient Education method: Explanation, Demonstration, Tactile cues, Verbal cues, and Handouts Education comprehension: verbalized understanding, returned demonstration, verbal cues required, and needs further education    HOME EXERCISE PROGRAM: Access Code: [email or web address] URL: [email or web address] Date: 08/28/2021 Prepared by: Gwenevere Abbot  Exercises - Serratus Forearm Push Up Plus at Wall with Elbows - 1 x daily - 3 x weekly - 1 sets - 10 reps - no hold - Standing Shoulder Row with Anchored Resistance - 1 x daily - 3 x weekly - 1-3 sets - 10 reps - 3sec hold - Shoulder extension with resistance - Neutral - 1 x daily - 3 x weekly - 1-3 sets - 10 reps - 3sec hold - Standing Shoulder External Rotation with Resistance - 1 x daily - 3 x weekly - 1-3 sets - 10 reps - 3sec hold - Single Arm Doorway Pec Stretch at 90 Degrees Abduction - 1-2 x daily - 7 x weekly - 2 reps - 30-60sec hold  ASSESSMENT:   CLINICAL IMPRESSION: Pt has not been seen in therapy for several weeks due to vacation and illness. She has  made excellent improvements in left shoulder AROM, although she does still demonstrate limitations especially with flexion and abduction. She has much more functional ROM now, but does still compensate due to weakness. The pinching sensation has also improved a great deal and was not noticed today with exercising except if trying to go too far with ER.We performed strengthening today and assessed goals.  Her HEP was updated with pictures.She will continue to benefit from PT monitoring 1x every 3 weeks over 9 weeks as we progress her strength before discharge.  OBJECTIVE IMPAIRMENTS decreased knowledge of use of DME, decreased ROM, Decreased strength and increased edema.    ACTIVITY LIMITATIONS community activity and yard work. , overhead reaching activities   PERSONAL FACTORS 1 comorbidity: hematoma complication  are also affecting patient's functional outcome.      REHAB POTENTIAL: Excellent   CLINICAL DECISION MAKING: Stable/uncomplicated   EVALUATION COMPLEXITY: Low  GOALS: Goals reviewed with patient? Yes     LONG TERM GOALS: Target date: 05/23/2022/   carry over and revised prn   12/19/2022   Pt will improve Lt UE AROM to equal to the Rt UE and without pain. Goal revised : ROM within 10 degrees of Right UE for improved reaching Baseline:  Goal status: ONGOING   2.  Pt will be ind with self care regarding Lt breast hematoma including self massage, compression bra, and use of foam or swell spot Baseline:  Goal status: MET - pt reports it has softened but it is still there  3. Pt will not demonstrate shooting breast  pains to allow improved comfort.  Baseline:  Goals status: MET   4. Pt will be independent in HEP for left shoulder ROM and in particular strengthening  GOAL STATUS : MET  ROM, Ongoing for strength  5. Pt will improve shoulder flexion and abduction to atleast 120 degrees for improved reaching  Goal status  MET  (08/01/22)  6. Pt will have decreased left  shoulder pain no greater than 2/10  Goal status: MET 10/17/22  7. Pt will be able to fasten bra behind back  Goal Status: MET 7/19 PLAN: PT FREQUENCY: 1x/week every 3 weeks   PT DURATION: 9 weeks    PLANNED INTERVENTIONS: Therapeutic exercises, Patient/Family education, Joint mobilization, DME instructions, Manual lymph drainage, , scar mobilization, Dry Needling Taping, and Manual therapy, iontophoresis with dexamethasone*   PLAN FOR NEXT SESSION:  Continue to progress strength, PROM , STM/cupping only prn. Emphasis on strength, chest press, serratus, wal lpush ups, wall clock,   Alvira Monday, PT 10/17/22 12:34 PM     [facility] [address] [place], [place] [address] Phone # [phone] Fax [phone]

## 2022-10-22 ENCOUNTER — Ambulatory Visit: Payer: 59

## 2022-11-07 ENCOUNTER — Ambulatory Visit: Payer: 59 | Attending: Radiation Oncology

## 2022-11-07 DIAGNOSIS — R252 Cramp and spasm: Secondary | ICD-10-CM | POA: Insufficient documentation

## 2022-11-07 DIAGNOSIS — M25612 Stiffness of left shoulder, not elsewhere classified: Secondary | ICD-10-CM | POA: Insufficient documentation

## 2022-11-07 DIAGNOSIS — M7981 Nontraumatic hematoma of soft tissue: Secondary | ICD-10-CM | POA: Diagnosis present

## 2022-11-07 DIAGNOSIS — N6489 Other specified disorders of breast: Secondary | ICD-10-CM | POA: Diagnosis present

## 2022-11-07 DIAGNOSIS — D0512 Intraductal carcinoma in situ of left breast: Secondary | ICD-10-CM | POA: Diagnosis present

## 2022-11-07 NOTE — Therapy (Signed)
OUTPATIENT PHYSICAL THERAPY TREATMENT NOTE   Patient Name: Beth Stephens MRN: [id number] DOB:[birth year], 47 y.o., female Today's Date: 11/07/2022  PCP: Irena Reichmann, DO REFERRING PROVIDER: Lonie Peak, MD  END OF SESSION:   PT End of Session - 11/07/22 0908     Visit Number 39    Number of Visits 43    Date for PT Re-Evaluation 12/19/22    Authorization Type [employer]    Authorization - Visit Number 39    PT Start Time 0908    Activity Tolerance Patient tolerated treatment well    Behavior During Therapy [facility] for tasks assessed/performed                       Past Medical History:  Diagnosis Date   Cancer (HCC)    Breast Cancer   Colitis    Colitis    Past Surgical History:  Procedure Laterality Date   BREAST LUMPECTOMY WITH RADIOACTIVE SEED LOCALIZATION Left 05/01/2021   Procedure: LEFT BREAST LUMPECTOMY WITH RADIOACTIVE SEED LOCALIZATION;  Surgeon: Almond Lint, MD;  Location: [facility] OR;  Service: General;  Laterality: Left;   COLONOSCOPY     WISDOM TOOTH EXTRACTION     Patient Active Problem List   Diagnosis Date Noted   Cancer of left breast (HCC) 11/25/2021   Iron deficiency anemia due to chronic blood loss 05/24/2021   Genetic testing 04/22/2021   Ductal carcinoma in situ (DCIS) of left breast 04/08/2021    REFERRING DIAG: DCIS Lt breast  THERAPY DIAG:  Stiffness of left shoulder, not elsewhere classified  Cramp and spasm  Breast edema  Nontraumatic hematoma of soft tissue  Ductal carcinoma in situ (DCIS) of left breast  SUBJECTIVE: [facility] My [facility] is feeling pretty good. I went through the ABC book 1 time  PERTINENT HISTORY: Left lumpectomy 05/01/21 no SLNB with Dr. Donell Beers.  Large hematoma formation. Radiation completed  PRECAUTIONS: Other: very small lymphedema risk Lt UE and breast due to radiation    Are you having pain? yes-  it is  0/10 at rest in shoulder today  but when I stretch it gets to a pinch  3-4/10 Pain location: anterior/posterior shoulder Pain description: sharp  Aggravating factors: sleeping on it, reaching overhead, fastening bra now in front because can't do behind Relieving factors: rest   OBJECTIVE:  PALPATION: Very hard mandarin orange size hematoma under lateral breast incision, +2 ttp axillary palpation with some puffiness here.  Overall breast not swollen.   12/23/2021 Hematoma is softer with less defined borders, pt is very tender today at lateral lower ribcage   OBSERVATIONS / OTHER ASSESSMENTS: 07/22/21: radiated field still red and healing especially inferior to breast, and peeling blister area in the axilla.        07/02/21: healing hematoma Lt lateral breast, puffiness Lt axilla, wearing soft cotton eye clasp front bra which has no compression but pt reports is comfortable and the only fabric that is okay to wear currently. 11/04/21- hematoma still firm and palpable in left lateral breast, educated pt to try and sleep in her compression bra 08/01/2022; Significant UT compensation with AROM Left shoulder  in standing with poor scapular mobility   UPPER EXTREMITY AROM/PROM:   A/PROM RIGHT  07/02/2021    Shoulder extension 53  Shoulder flexion 174  Shoulder abduction 165  Shoulder internal rotation Behind the back to T6  Shoulder external rotation 90                          (  Blank rows = not tested)   A/PROM LEFT  07/02/2021 07/22/21 08/13/21 08/28/21 11/04/21 12/23/2021 02/28/2022 06/06/2022 08/01/2022 5/16 5/29 7/19 LEFT  Shoulder extension 54    66 60 60 40 32 125 140 EXT 60  Shoulder flexion  145 - ache top of shoulde 145 - ache top of shoulde 155 - with top of the shoulder pinch 155 - no pinch  151 - feels pain coming on 155 pulls at rib 159 95 pinch 82 125 132 FLEX: 149 no pinch  Shoulder abduction 160 155 - ache top of the shoulder  155 - ache top of the shoulder  167- no pinch 150 - feels pain coming on 150 with pinch at end range 140 Fear of pinch 72pain 73   ABD:152   Shoulder internal rotation Behind the back to T4     T8 with pinch  Left buttocks Left buttocks L3 T/L jxn with end range pinch IR BEHIND BACK:T7  Shoulder external rotation 90     94 with pinch at endrange   30 supine @ 45   ER  :94                          (Blank rows = not tested) UPPER EXTREMITY ROM:  AROM 11/07/2022   Shoulder extension    Shoulder flexion 131   Shoulder abduction 150 min compensation   Shoulder extension    Shoulder internal rotation    Shoulder external rotation    Elbow flexion    Elbow extension     (Blank rows = not tested)    STRENGTH: 10/17/2022 Left UE: Flex and 3+-4-/5 ABD: 3+/5 IR;4/5 ER 3+/5  06/06/2022 PROM left shoulder Flex 115, IR at 45 degrees abd 63, ER at 45 deg 37  08/20/2022: Left shoulder flexion A/ROM 130 degrees (pre-needling), A/ROM 135 degrees after dry needling Left shoulder abduction A/ROM 125 degrees pre-needling, A/ROM 131 degrees after dry needling   TODAY'S TREATMENT  11/07/2022 Discussed gym activities Prone ext 1 # x 15 Prone row 2x10 Prone horizontal abd  1 # 2 x 10 Prone scaption 0# x 10 Push up with a plus on wall x 15 Wall walk yellow band 3 laps Jobes flexion and scaption 1# x 10 ea     10/17/2022 Assessed AROM of left UE, and strength Instructed left UE strength as follows Prone extension, row, 1# x 10 ea Horizontal abd x 10 0# SL ER 1 # x 15 Standing jobes 1# flexion and scaption in front of mirror x 10 ea #2 red weighted ball rolls x 10 4D for scap stab Updated HEP with Medbridge pictures but unable to copy into chart Pt educated how to progress reps/resistance and given ABC strength handout with instructions 09/19/22  GH mobs posterior and inf gr 3/4 Supine wand flexion and scaption x 5, ER x 5 STM Left UT, pectorals, lateral trunk and scapular region;cupping to lateral trunk and scapular region. PROM left shoulder  flex, scaption, abd, IR and ER with C-R stretching Supine on half foam roll bilateral  flexion, scaption, horizontal abd x 5 Showed single arm pec stretch x 3    09/03/22: Supine dowel + 3lb bil shoulder flexion x8 Supine T with 2lb dumbbell Lt x6 rounds - VC for scapular setting into table Light green loop Lt lateral and diagonal superior forearm slides for lower trap setting 5x lateral, 5. Diagonal - very fatiguing Lower trap "Y" lift offs from wall x  10 Assessment for TP: Pt is free of TP today Joint mobs at end range Gr II/III/IV with light traction for abduction, IR, ER Moist heat x8' end of session for soreness   PATIENT EDUCATION:   10/17/2022 Kneeling ext, row, horizontal abd, SL ER, standing jobes flexion, scaption to shoulder height; Medbridge would not copy ABC Breast Cancer strength handout  Access Code: [email or web address] URL: [email or web address] Date: 01/06/2022 Prepared by: Alvira Monday  Exercises - Supine Shoulder Alphabet  - 1 x daily - 7 x weekly - Supine Single Arm Shoulder Protraction  - 1 x daily - 7 x weekly - 10 reps  Education details: theraband exs SR, IR and ER with yellow Person educated: Patient Education method: Explanation, Demonstration, Tactile cues, Verbal cues, and Handouts Education comprehension: verbalized understanding, returned demonstration, verbal cues required, and needs further education    HOME EXERCISE PROGRAM: Access Code: [email or web address] URL: [email or web address] Date: 11/07/2022 Prepared by: Alvira Monday  Exercises - Horizontal Wall Walk with Resistance  - 1 x daily - 7 x weekly - 2-3 reps - Standing Wall [employer] with Mini Swiss Ball  - 1 x daily - 7 x weekly - 2 sets - 10 reps - Wall Push Up with Plus  - 1 x daily - 7 x weekly - 2 sets - 10 reps Access Code: [email or web address] URL: [email or web address] Date: 08/28/2021 Prepared by: Gwenevere Abbot  Exercises - Serratus Forearm Push Up Plus at Wall with Elbows - 1 x daily - 3 x weekly - 1 sets - 10 reps - no hold - Standing Shoulder  Row with Anchored Resistance - 1 x daily - 3 x weekly - 1-3 sets - 10 reps - 3sec hold - Shoulder extension with resistance - Neutral - 1 x daily - 3 x weekly - 1-3 sets - 10 reps - 3sec hold - Standing Shoulder External Rotation with Resistance - 1 x daily - 3 x weekly - 1-3 sets - 10 reps - 3sec hold - Single Arm Doorway Pec Stretch at 90 Degrees Abduction - 1-2 x daily - 7 x weekly - 2 reps - 30-60sec hold  ASSESSMENT:   CLINICAL IMPRESSION: Pt has been compliant with her exercise routine and demonstrates improvement in AROM. She has been able to progress resistance and repetitions with selected exercises. She continues with some UT compensation, but improved overall. She did fatigue more easily with band walks and jobes flexion/scaption exercises and when fatigued compensation is more evident.  OBJECTIVE IMPAIRMENTS decreased knowledge of use of DME, decreased ROM, Decreased strength and increased edema.    ACTIVITY LIMITATIONS community activity and yard work. , overhead reaching activities   PERSONAL FACTORS 1 comorbidity: hematoma complication  are also affecting patient's functional outcome.      REHAB POTENTIAL: Excellent   CLINICAL DECISION MAKING: Stable/uncomplicated   EVALUATION COMPLEXITY: Low  GOALS: Goals reviewed with patient? Yes     LONG TERM GOALS: Target date: 05/23/2022/   carry over and revised prn   12/19/2022   Pt will improve Lt UE AROM to equal to the Rt UE and without pain. Goal revised : ROM within 10 degrees of Right UE for improved reaching Baseline:  Goal status: ONGOING   2.  Pt will be ind with self care regarding Lt breast hematoma including self massage, compression bra, and use of foam or swell spot Baseline:  Goal status: MET - pt reports it has softened but it is still there  3. Pt will not demonstrate shooting breast  pains to allow improved comfort.  Baseline:  Goals status: MET   4. Pt will be independent in HEP for left shoulder ROM  and in particular strengthening             GOAL STATUS : MET  ROM, Ongoing for strength  5. Pt will improve shoulder flexion and abduction to atleast 120 degrees for improved reaching  Goal status  MET  (08/01/22)  6. Pt will have decreased left shoulder pain no greater than 2/10  Goal status: MET 10/17/22  7. Pt will be able to fasten bra behind back  Goal Status: MET 7/19 PLAN: PT FREQUENCY: 1x/week every 3 weeks   PT DURATION: 9 weeks    PLANNED INTERVENTIONS: Therapeutic exercises, Patient/Family education, Joint mobilization, DME instructions, Manual lymph drainage, , scar mobilization, Dry Needling Taping, and Manual therapy, iontophoresis with dexamethasone*   PLAN FOR NEXT SESSION:  Continue to progress strength, PROM , STM/cupping only prn. Emphasis on strength, chest press, serratus, wall push ups, wall clock,   Alvira Monday, PT 11/07/22 9:09 AM     [facility] [address] [place], [place] [address] Phone # [phone] Fax [phone]

## 2022-11-09 ENCOUNTER — Other Ambulatory Visit: Payer: Self-pay | Admitting: Hematology

## 2022-11-28 ENCOUNTER — Ambulatory Visit: Payer: 59

## 2022-11-28 DIAGNOSIS — M25612 Stiffness of left shoulder, not elsewhere classified: Secondary | ICD-10-CM

## 2022-11-28 DIAGNOSIS — M7981 Nontraumatic hematoma of soft tissue: Secondary | ICD-10-CM

## 2022-11-28 DIAGNOSIS — D0512 Intraductal carcinoma in situ of left breast: Secondary | ICD-10-CM

## 2022-11-28 DIAGNOSIS — R252 Cramp and spasm: Secondary | ICD-10-CM

## 2022-11-28 DIAGNOSIS — N6489 Other specified disorders of breast: Secondary | ICD-10-CM

## 2022-11-28 NOTE — Therapy (Signed)
OUTPATIENT PHYSICAL THERAPY TREATMENT NOTE   Patient Name: Beth Stephens MRN: [id number] DOB:1975-11-01, 47 y.o., female Today's Date: 11/28/2022  PCP: Irena Reichmann, DO REFERRING PROVIDER: Lonie Peak, MD  END OF SESSION:   PT End of Session - 11/28/22 1012     Visit Number 40    Number of Visits 43    Date for PT Re-Evaluation 12/19/22    PT Start Time 1010    PT Stop Time 1050    PT Time Calculation (min) 40 min    Activity Tolerance Patient tolerated treatment well    Behavior During Therapy WFL for tasks assessed/performed                       Past Medical History:  Diagnosis Date   Cancer (HCC)    Breast Cancer   Colitis    Colitis    Past Surgical History:  Procedure Laterality Date   BREAST LUMPECTOMY WITH RADIOACTIVE SEED LOCALIZATION Left 05/01/2021   Procedure: LEFT BREAST LUMPECTOMY WITH RADIOACTIVE SEED LOCALIZATION;  Surgeon: Almond Lint, MD;  Location: [facility] OR;  Service: General;  Laterality: Left;   COLONOSCOPY     WISDOM TOOTH EXTRACTION     Patient Active Problem List   Diagnosis Date Noted   Cancer of left breast (HCC) 11/25/2021   Iron deficiency anemia due to chronic blood loss 05/24/2021   Genetic testing 04/22/2021   Ductal carcinoma in situ (DCIS) of left breast 04/08/2021    REFERRING DIAG: DCIS Lt breast  THERAPY DIAG:  Stiffness of left shoulder, not elsewhere classified  Cramp and spasm  Breast edema  Nontraumatic hematoma of soft tissue  Ductal carcinoma in situ (DCIS) of left breast  SUBJECTIVE: I got sick from my son  a few weeks ago so I am getting back into exercises now. Overall I'm doing much better.   PERTINENT HISTORY: Left lumpectomy 05/01/21 no SLNB with Dr. Donell Beers.  Large hematoma formation. Radiation completed  PRECAUTIONS: Other: very small lymphedema risk Lt UE and breast due to radiation    Are you having pain? No, the pinch is only occasional now I if push too much.   OBJECTIVE:   PALPATION: Very hard mandarin orange size hematoma under lateral breast incision, +2 ttp axillary palpation with some puffiness here.  Overall breast not swollen.   12/23/2021 Hematoma is softer with less defined borders, pt is very tender today at lateral lower ribcage   OBSERVATIONS / OTHER ASSESSMENTS: 07/22/21: radiated field still red and healing especially inferior to breast, and peeling blister area in the axilla.        07/02/21: healing hematoma Lt lateral breast, puffiness Lt axilla, wearing soft cotton eye clasp front bra which has no compression but pt reports is comfortable and the only fabric that is okay to wear currently. 11/04/21- hematoma still firm and palpable in left lateral breast, educated pt to try and sleep in her compression bra 08/01/2022; Significant UT compensation with AROM Left shoulder  in standing with poor scapular mobility   UPPER EXTREMITY AROM/PROM:   A/PROM RIGHT  07/02/2021    Shoulder extension 53  Shoulder flexion 174  Shoulder abduction 165  Shoulder internal rotation Behind the back to T6  Shoulder external rotation 90                          (Blank rows = not tested)   A/PROM LEFT  07/02/2021 07/22/21 08/13/21 08/28/21 11/04/21  12/23/2021 02/28/2022 06/06/2022 08/01/2022 5/16 5/29 7/19 LEFT  Shoulder extension 54    66 60 60 40 32 125 140 EXT 60  Shoulder flexion  145 - ache top of shoulde 145 - ache top of shoulde 155 - with top of the shoulder pinch 155 - no pinch  151 - feels pain coming on 155 pulls at rib 159 95 pinch 82 125 132 FLEX: 149 no pinch  Shoulder abduction 160 155 - ache top of the shoulder  155 - ache top of the shoulder  167- no pinch 150 - feels pain coming on 150 with pinch at end range 140 Fear of pinch 72pain 73   ABD:152  Shoulder internal rotation Behind the back to T4     T8 with pinch  Left buttocks Left buttocks L3 T/L jxn with end range pinch IR BEHIND BACK:T7  Shoulder external rotation 90     94 with pinch at endrange   30 supine @ 45    ER  :94                          (Blank rows = not tested) UPPER EXTREMITY ROM:  AROM 11/07/2022   Shoulder extension    Shoulder flexion 131   Shoulder abduction 150 min compensation   Shoulder extension    Shoulder internal rotation    Shoulder external rotation    Elbow flexion    Elbow extension     (Blank rows = not tested)    STRENGTH: 10/17/2022 Left UE: Flex and 3+-4-/5 ABD: 3+/5 IR;4/5 ER 3+/5  06/06/2022 PROM left shoulder Flex 115, IR at 45 degrees abd 63, ER at 45 deg 37  08/20/2022: Left shoulder flexion A/ROM 130 degrees (pre-needling), A/ROM 135 degrees after dry needling Left shoulder abduction A/ROM 125 degrees pre-needling, A/ROM 131 degrees after dry needling   TODAY'S TREATMENT  11/28/22: Therapeutic Exercises Prone ext 2#, x 20  Prone row 2#, x 20 Prone horz abd 2#, 2 x 10 Prone scaption 1#, 2 x 10 with tactile cuing to Lt scapula  Prone flexion 2#, 2 x 10 Jobes flexion 2#, 2 x10 and scaption 1#, 2 x 10 Wall Push Ups 2 x 10 Wall walk with forearms on wall and used yellow band 5 laps; then bil scapular retraction in same position 2 x 5 returning therapist demo and tactile cues to decrease scapular compensations on Lt side  11/07/2022 Discussed gym activities Prone ext 1 # x 15 Prone row 2x10 Prone horizontal abd  1 # 2 x 10 Prone scaption 0# x 10 Push up with a plus on wall x 15 Wall walk yellow band 3 laps Jobes flexion and scaption 1# x 10 ea   10/17/2022 Assessed AROM of left UE, and strength Instructed left UE strength as follows Prone extension, row, 1# x 10 ea Horizontal abd x 10 0# SL ER 1 # x 15 Standing jobes 1# flexion and scaption in front of mirror x 10 ea #2 red weighted ball rolls x 10 4D for scap stab Updated HEP with Medbridge pictures but unable to copy into chart Pt educated how to progress reps/resistance and given ABC strength handout with instructions  09/19/22  GH mobs posterior and inf gr 3/4 Supine wand flexion and  scaption x 5, ER x 5 STM Left UT, pectorals, lateral trunk and scapular region;cupping to lateral trunk and scapular region. PROM left shoulder  flex, scaption, abd, IR and ER with  C-R stretching Supine on half foam roll bilateral flexion, scaption, horizontal abd x 5 Showed single arm pec stretch x 3    09/03/22: Supine dowel + 3lb bil shoulder flexion x8 Supine T with 2lb dumbbell Lt x6 rounds - VC for scapular setting into table Light green loop Lt lateral and diagonal superior forearm slides for lower trap setting 5x lateral, 5. Diagonal - very fatiguing Lower trap "Y" lift offs from wall x 10 Assessment for TP: Pt is free of TP today Joint mobs at end range Gr II/III/IV with light traction for abduction, IR, ER Moist heat x8' end of session for soreness   PATIENT EDUCATION:   10/17/2022 Kneeling ext, row, horizontal abd, SL ER, standing jobes flexion, scaption to shoulder height; Medbridge would not copy ABC Breast Cancer strength handout  Access Code: [email or web address] URL: [email or web address] Date: 01/06/2022 Prepared by: Alvira Monday  Exercises - Supine Shoulder Alphabet  - 1 x daily - 7 x weekly - Supine Single Arm Shoulder Protraction  - 1 x daily - 7 x weekly - 10 reps  Education details: theraband exs SR, IR and ER with yellow Person educated: Patient Education method: Explanation, Demonstration, Tactile cues, Verbal cues, and Handouts Education comprehension: verbalized understanding, returned demonstration, verbal cues required, and needs further education    HOME EXERCISE PROGRAM: Access Code: [email or web address] URL: [email or web address] Date: 11/07/2022 Prepared by: Alvira Monday  Exercises - Horizontal Wall Walk with Resistance  - 1 x daily - 7 x weekly - 2-3 reps - Standing Wall [employer] with Mini Swiss Ball  - 1 x daily - 7 x weekly - 2 sets - 10 reps - Wall Push Up with Plus  - 1 x daily - 7 x weekly - 2 sets - 10 reps Access Code:  [email or web address] URL: [email or web address] Date: 08/28/2021 Prepared by: Gwenevere Abbot  Exercises - Serratus Forearm Push Up Plus at Wall with Elbows - 1 x daily - 3 x weekly - 1 sets - 10 reps - no hold - Standing Shoulder Row with Anchored Resistance - 1 x daily - 3 x weekly - 1-3 sets - 10 reps - 3sec hold - Shoulder extension with resistance - Neutral - 1 x daily - 3 x weekly - 1-3 sets - 10 reps - 3sec hold - Standing Shoulder External Rotation with Resistance - 1 x daily - 3 x weekly - 1-3 sets - 10 reps - 3sec hold - Single Arm Doorway Pec Stretch at 90 Degrees Abduction - 1-2 x daily - 7 x weekly - 2 reps - 30-60sec hold  ASSESSMENT:   CLINICAL IMPRESSION: Pt reports got sick from her son a few weeks ago so just recently is getting back to working out more consistently. Today progressed Lt shoulder strengthening exercises with 2# which she seemed to tolerate well with no increased pain or pinching reported. Pt required encouragement to rest between sets educating her that it is good for the muscle fibers to rest. She verbalized good understanding. Added scapular retraction with yellow theraband after wall walks and pt was challenged by this.   OBJECTIVE IMPAIRMENTS decreased knowledge of use of DME, decreased ROM, Decreased strength and increased edema.    ACTIVITY LIMITATIONS community activity and yard work. , overhead reaching activities   PERSONAL FACTORS 1 comorbidity: hematoma complication  are also affecting patient's functional outcome.      REHAB POTENTIAL: Excellent   CLINICAL DECISION MAKING: Stable/uncomplicated   EVALUATION COMPLEXITY: Low  GOALS: Goals reviewed with patient? Yes  LONG TERM GOALS: Target date: 05/23/2022/   carry over and revised prn   12/19/2022   Pt will improve Lt UE AROM to equal to the Rt UE and without pain. Goal revised : ROM within 10 degrees of Right UE for improved reaching Baseline:  Goal status: ONGOING   2.  Pt will be  ind with self care regarding Lt breast hematoma including self massage, compression bra, and use of foam or swell spot Baseline:  Goal status: MET - pt reports it has softened but it is still there  3. Pt will not demonstrate shooting breast  pains to allow improved comfort.  Baseline:  Goals status: MET   4. Pt will be independent in HEP for left shoulder ROM and in particular strengthening             GOAL STATUS : MET  ROM, Ongoing for strength  5. Pt will improve shoulder flexion and abduction to atleast 120 degrees for improved reaching  Goal status  MET  (08/01/22)  6. Pt will have decreased left shoulder pain no greater than 2/10  Goal status: MET 10/17/22  7. Pt will be able to fasten bra behind back  Goal Status: MET 7/19 PLAN: PT FREQUENCY: 1x/week every 3 weeks   PT DURATION: 9 weeks    PLANNED INTERVENTIONS: Therapeutic exercises, Patient/Family education, Joint mobilization, DME instructions, Manual lymph drainage, , scar mobilization, Dry Needling Taping, and Manual therapy, iontophoresis with dexamethasone*   PLAN FOR NEXT SESSION:  Continue to progress strength, PROM , STM/cupping only prn. Emphasis on strength, chest press, serratus, wall push ups, wall clock,   Berna Spare, PTA 11/28/22 11:01 AM     [facility] [address] [place], [place] [address] Phone # [phone] Fax [phone]

## 2022-12-19 ENCOUNTER — Ambulatory Visit: Payer: 59 | Attending: Radiation Oncology

## 2022-12-19 DIAGNOSIS — M25612 Stiffness of left shoulder, not elsewhere classified: Secondary | ICD-10-CM | POA: Insufficient documentation

## 2022-12-19 DIAGNOSIS — M7981 Nontraumatic hematoma of soft tissue: Secondary | ICD-10-CM | POA: Diagnosis present

## 2022-12-19 DIAGNOSIS — R252 Cramp and spasm: Secondary | ICD-10-CM | POA: Diagnosis present

## 2022-12-19 DIAGNOSIS — N6489 Other specified disorders of breast: Secondary | ICD-10-CM | POA: Diagnosis present

## 2022-12-19 DIAGNOSIS — D0512 Intraductal carcinoma in situ of left breast: Secondary | ICD-10-CM | POA: Insufficient documentation

## 2022-12-19 NOTE — Therapy (Signed)
OUTPATIENT PHYSICAL THERAPY TREATMENT NOTE   Patient Name: Beth Stephens MRN: [id number] DOB:23-Sep-1975, 47 y.o., female Today's Date: 12/19/2022  PCP: Irena Reichmann, DO REFERRING PROVIDER: Lonie Peak, MD  END OF SESSION:   PT End of Session - 12/19/22 1016     Visit Number 41    Number of Visits 43    Date for PT Re-Evaluation 12/19/22    PT Start Time 1017   late   PT Stop Time 1059    PT Time Calculation (min) 42 min    Activity Tolerance Patient tolerated treatment well    Behavior During Therapy WFL for tasks assessed/performed                       Past Medical History:  Diagnosis Date   Cancer (HCC)    Breast Cancer   Colitis    Colitis    Past Surgical History:  Procedure Laterality Date   BREAST LUMPECTOMY WITH RADIOACTIVE SEED LOCALIZATION Left 05/01/2021   Procedure: LEFT BREAST LUMPECTOMY WITH RADIOACTIVE SEED LOCALIZATION;  Surgeon: Almond Lint, MD;  Location: [facility] OR;  Service: General;  Laterality: Left;   COLONOSCOPY     WISDOM TOOTH EXTRACTION     Patient Active Problem List   Diagnosis Date Noted   Cancer of left breast (HCC) 11/25/2021   Iron deficiency anemia due to chronic blood loss 05/24/2021   Genetic testing 04/22/2021   Ductal carcinoma in situ (DCIS) of left breast 04/08/2021    REFERRING DIAG: DCIS Lt breast  THERAPY DIAG:  Stiffness of left shoulder, not elsewhere classified  Cramp and spasm  Breast edema  Nontraumatic hematoma of soft tissue  Ductal carcinoma in situ (DCIS) of left breast  SUBJECTIVE: My shoulder is doing well I think and I am going to the gym when I can. I think I am OK to be released. I am using 3 to 5 lb wts. I can touch my right hand to my left behind my back now and that makes me happy. Pt reports no more pinching in left shoulder.  PERTINENT HISTORY: Left lumpectomy 05/01/21 no SLNB with Dr. Donell Beers.  Large hematoma formation. Radiation completed  PRECAUTIONS: Other: very small  lymphedema risk Lt UE and breast due to radiation    Are you having pain? No pain and no pinching  OBJECTIVE:  PALPATION: Very hard mandarin orange size hematoma under lateral breast incision, +2 ttp axillary palpation with some puffiness here.  Overall breast not swollen.   12/23/2021 Hematoma is softer with less defined borders, pt is very tender today at lateral lower ribcage   OBSERVATIONS / OTHER ASSESSMENTS: 07/22/21: radiated field still red and healing especially inferior to breast, and peeling blister area in the axilla.        07/02/21: healing hematoma Lt lateral breast, puffiness Lt axilla, wearing soft cotton eye clasp front bra which has no compression but pt reports is comfortable and the only fabric that is okay to wear currently. 11/04/21- hematoma still firm and palpable in left lateral breast, educated pt to try and sleep in her compression bra 08/01/2022; Significant UT compensation with AROM Left shoulder  in standing with poor scapular mobility   UPPER EXTREMITY AROM/PROM:   A/PROM RIGHT  07/02/2021    Shoulder extension 53  Shoulder flexion 174  Shoulder abduction 165  Shoulder internal rotation Behind the back to T6  Shoulder external rotation 90                          (  Blank rows = not tested)   A/PROM LEFT  07/02/2021 07/22/21 08/13/21 08/28/21 11/04/21 12/23/2021 02/28/2022 06/06/2022 08/01/2022 5/16 5/29 7/19 LEFT  Shoulder extension 54    66 60 60 40 32 125 140 EXT 60  Shoulder flexion  145 - ache top of shoulde 145 - ache top of shoulde 155 - with top of the shoulder pinch 155 - no pinch  151 - feels pain coming on 155 pulls at rib 159 95 pinch 82 125 132 FLEX: 149 no pinch  Shoulder abduction 160 155 - ache top of the shoulder  155 - ache top of the shoulder  167- no pinch 150 - feels pain coming on 150 with pinch at end range 140 Fear of pinch 72pain 73   ABD:152  Shoulder internal rotation Behind the back to T4     T8 with pinch  Left buttocks Left buttocks L3 T/L jxn  with end range pinch IR BEHIND BACK:T7  Shoulder external rotation 90     94 with pinch at endrange   30 supine @ 45   ER  :94                          (Blank rows = not tested) UPPER EXTREMITY ROM:  AROM 11/07/2022 12/19/2022  Shoulder extension  57  Shoulder flexion 131 155  Shoulder abduction 150 min compensation 158  Shoulder extension    Shoulder internal rotation  T6  Shoulder external rotation  95  Elbow flexion    Elbow extension     (Blank rows = not tested)    STRENGTH: 10/17/2022 Left UE: Flex and 3+-4-/5 ABD: 3+/5 IR;4/5 ER 3+/5  06/06/2022 PROM left shoulder Flex 115, IR at 45 degrees abd 63, ER at 45 deg 37  08/20/2022: Left shoulder flexion A/ROM 130 degrees (pre-needling), A/ROM 135 degrees after dry needling Left shoulder abduction A/ROM 125 degrees pre-needling, A/ROM 131 degrees after dry needling   TODAY'S TREATMENT   12/19/2022 Measured AROM LEFT shoulder Practiced triceps kickbacks with 1-2# wt kneeling on table and in prone Red weighted ball stabs on wall  2 x 10 4 D Prone horizontal abduction 2 # Prone Y;0-1 lb x 10-15 reps Updated HEP with pictures of Triceps kickback and advised pt not to do it standing with hand behind neck Discussed progression of exercises  11/28/22: Therapeutic Exercises Prone ext 2#, x 20  Prone row 2#, x 20 Prone horz abd 2#, 2 x 10 Prone scaption 1#, 2 x 10 with tactile cuing to Lt scapula  Prone flexion 2#, 2 x 10 Jobes flexion 2#, 2 x10 and scaption 1#, 2 x 10 Wall Push Ups 2 x 10 Wall walk with forearms on wall and used yellow band 5 laps; then bil scapular retraction in same position 2 x 5 returning therapist demo and tactile cues to decrease scapular compensations on Lt side  11/07/2022 Discussed gym activities Prone ext 1 # x 15 Prone row 2x10 Prone horizontal abd  1 # 2 x 10 Prone scaption 0# x 10 Push up with a plus on wall x 15 Wall walk yellow band 3 laps Jobes flexion and scaption 1# x 10  ea   10/17/2022 Assessed AROM of left UE, and strength Instructed left UE strength as follows Prone extension, row, 1# x 10 ea Horizontal abd x 10 0# SL ER 1 # x 15 Standing jobes 1# flexion and scaption in front of mirror x 10 ea #2  red weighted ball rolls x 10 4D for scap stab Updated HEP with Medbridge pictures but unable to copy into chart Pt educated how to progress reps/resistance and given ABC strength handout with instructions  09/19/22  GH mobs posterior and inf gr 3/4 Supine wand flexion and scaption x 5, ER x 5 STM Left UT, pectorals, lateral trunk and scapular region;cupping to lateral trunk and scapular region. PROM left shoulder  flex, scaption, abd, IR and ER with C-R stretching Supine on half foam roll bilateral flexion, scaption, horizontal abd x 5 Showed single arm pec stretch x 3    09/03/22: Supine dowel + 3lb bil shoulder flexion x8 Supine T with 2lb dumbbell Lt x6 rounds - VC for scapular setting into table Light green loop Lt lateral and diagonal superior forearm slides for lower trap setting 5x lateral, 5. Diagonal - very fatiguing Lower trap "Y" lift offs from wall x 10 Assessment for TP: Pt is free of TP today Joint mobs at end range Gr II/III/IV with light traction for abduction, IR, ER Moist heat x8' end of session for soreness   PATIENT EDUCATION:   10/17/2022 Kneeling ext, row, horizontal abd, SL ER, standing jobes flexion, scaption to shoulder height; Medbridge would not copy ABC Breast Cancer strength handout  Access Code: [email or web address] URL: [email or web address] Date: 01/06/2022 Prepared by: Alvira Monday  Exercises - Supine Shoulder Alphabet  - 1 x daily - 7 x weekly - Supine Single Arm Shoulder Protraction  - 1 x daily - 7 x weekly - 10 reps  Education details: theraband exs SR, IR and ER with yellow Person educated: Patient Education method: Explanation, Demonstration, Tactile cues, Verbal cues, and Handouts Education  comprehension: verbalized understanding, returned demonstration, verbal cues required, and needs further education    HOME EXERCISE PROGRAM: Access Code: [email or web address] URL: [email or web address] Date: 11/07/2022 Prepared by: Alvira Monday  Exercises - Horizontal Wall Walk with Resistance  - 1 x daily - 7 x weekly - 2-3 reps - Standing Wall [employer] with Mini Swiss Ball  - 1 x daily - 7 x weekly - 2 sets - 10 reps - Wall Push Up with Plus  - 1 x daily - 7 x weekly - 2 sets - 10 reps Access Code: [email or web address] URL: [email or web address] Date: 08/28/2021 Prepared by: Gwenevere Abbot  Exercises - Serratus Forearm Push Up Plus at Wall with Elbows - 1 x daily - 3 x weekly - 1 sets - 10 reps - no hold - Standing Shoulder Row with Anchored Resistance - 1 x daily - 3 x weekly - 1-3 sets - 10 reps - 3sec hold - Shoulder extension with resistance - Neutral - 1 x daily - 3 x weekly - 1-3 sets - 10 reps - 3sec hold - Standing Shoulder External Rotation with Resistance - 1 x daily - 3 x weekly - 1-3 sets - 10 reps - 3sec hold - Single Arm Doorway Pec Stretch at 90 Degrees Abduction - 1-2 x daily - 7 x weekly - 2 reps - 30-60sec hold  ASSESSMENT:   CLINICAL IMPRESSION: Pt has made excellent progress over the last few months. She has worked diligently to improve her strength and her AROM has made significant improvement. HER ROM is WNL for IR and ER and is lacking only 19 degrees of AROM for shoulder flexion and 13 degrees for abduction. She is not feeling the pinching sensation at end ranges. She has achieved all goals except for partially meeting Goal number 1 for ROM. She  should continue to improve as she continues with her HEP. She has been a pleasure to work with.  OBJECTIVE IMPAIRMENTS decreased knowledge of use of DME, decreased ROM, Decreased strength and increased edema.    ACTIVITY LIMITATIONS community activity and yard work. , overhead reaching activities   PERSONAL  FACTORS 1 comorbidity: hematoma complication  are also affecting patient's functional outcome.      REHAB POTENTIAL: Excellent   CLINICAL DECISION MAKING: Stable/uncomplicated   EVALUATION COMPLEXITY: Low  GOALS: Goals reviewed with patient? Yes     LONG TERM GOALS: Target date: 05/23/2022/   carry over and revised prn   12/19/2022   Pt will improve Lt UE AROM to equal to the Rt UE and without pain. Goal revised : ROM within 10 degrees of Right UE for improved reaching Baseline:  Goal status: MET for abd, IR and ER; Not met for Flexion but greatly improved (12/19/2022) 2.  Pt will be ind with self care regarding Lt breast hematoma including self massage, compression bra, and use of foam or swell spot Baseline:  Goal status: MET - pt reports it has softened but it is still there  3. Pt will not demonstrate shooting breast  pains to allow improved comfort.  Baseline:  Goals status: MET   4. Pt will be independent in HEP for left shoulder ROM and in particular strengthening             GOAL STATUS : MET   5. Pt will improve shoulder flexion and abduction to atleast 120 degrees for improved reaching  Goal status  MET  (08/01/22)  6. Pt will have decreased left shoulder pain no greater than 2/10  Goal status: MET 10/17/22  7. Pt will be able to fasten bra behind back  Goal Status: MET 7/19 PLAN: PT FREQUENCY: 1x/week every 3 weeks   PT DURATION: 9 weeks    PLANNED INTERVENTIONS: Therapeutic exercises, Patient/Family education, Joint mobilization, DME instructions, Manual lymph drainage, , scar mobilization, Dry Needling Taping, and Manual therapy, iontophoresis with dexamethasone*   PLAN FOR NEXT SESSION:  Pt discharged to continue with independent self management PHYSICAL THERAPY DISCHARGE SUMMARY  Visits from Start of Care: 41  Current functional level related to goals / functional outcomes: Achieved all goals except partially achieved goal #1 for ROM ;    Remaining  deficits: Lacking 13 degrees of AROM for abduction and 19 degrees for flexion. Should continue to improve as she performs her HEP.   Education / Equipment: Shoulder ROM, strength, function, MLD   Patient agrees to discharge. Patient goals were  ALL MET except 1 partially MET for ROM . Patient is being discharged due to being pleased with the current functional level.   Alvira Monday, PT 12/19/22 4:19 PM   [facility] [address], [address] [place], [place] [address] Phone # [phone] Fax [phone]

## 2023-01-30 ENCOUNTER — Telehealth: Payer: Self-pay | Admitting: Nurse Practitioner

## 2023-01-30 NOTE — Telephone Encounter (Signed)
Patient called and left vm to cancel appts for 11/6. Request completed.

## 2023-02-04 ENCOUNTER — Ambulatory Visit: Payer: 59 | Admitting: Nurse Practitioner

## 2023-02-04 ENCOUNTER — Ambulatory Visit: Payer: 59 | Admitting: Hematology

## 2023-02-04 ENCOUNTER — Other Ambulatory Visit: Payer: 59

## 2023-04-20 ENCOUNTER — Encounter: Payer: Self-pay | Admitting: Hematology

## 2023-06-10 ENCOUNTER — Telehealth: Payer: Self-pay | Admitting: Hematology

## 2023-06-10 NOTE — Telephone Encounter (Signed)
 Received patient's voicemail on 06/10/2023; patient is aware of scheduled appointment times/dates per patient request; patient has callback number if needing to cancel or change appointment times/dates

## 2023-07-28 ENCOUNTER — Inpatient Hospital Stay: Admitting: Hematology

## 2023-07-28 ENCOUNTER — Inpatient Hospital Stay: Attending: Hematology

## 2023-07-28 ENCOUNTER — Other Ambulatory Visit: Payer: Self-pay | Admitting: Hematology

## 2023-07-28 VITALS — BP 109/71 | HR 79 | Temp 97.4°F | Resp 21 | Wt 122.4 lb

## 2023-07-28 DIAGNOSIS — Z1321 Encounter for screening for nutritional disorder: Secondary | ICD-10-CM | POA: Diagnosis not present

## 2023-07-28 DIAGNOSIS — E559 Vitamin D deficiency, unspecified: Secondary | ICD-10-CM

## 2023-07-28 DIAGNOSIS — Z79810 Long term (current) use of selective estrogen receptor modulators (SERMs): Secondary | ICD-10-CM | POA: Diagnosis not present

## 2023-07-28 DIAGNOSIS — Z923 Personal history of irradiation: Secondary | ICD-10-CM | POA: Insufficient documentation

## 2023-07-28 DIAGNOSIS — D0512 Intraductal carcinoma in situ of left breast: Secondary | ICD-10-CM | POA: Insufficient documentation

## 2023-07-28 DIAGNOSIS — Z1231 Encounter for screening mammogram for malignant neoplasm of breast: Secondary | ICD-10-CM | POA: Diagnosis not present

## 2023-07-28 LAB — CMP (CANCER CENTER ONLY)
ALT: 15 U/L (ref 0–44)
AST: 17 U/L (ref 15–41)
Albumin: 4.3 g/dL (ref 3.5–5.0)
Alkaline Phosphatase: 63 U/L (ref 38–126)
Anion gap: 7 (ref 5–15)
BUN: 11 mg/dL (ref 6–20)
CO2: 27 mmol/L (ref 22–32)
Calcium: 9.1 mg/dL (ref 8.9–10.3)
Chloride: 108 mmol/L (ref 98–111)
Creatinine: 0.7 mg/dL (ref 0.44–1.00)
GFR, Estimated: >60 mL/min (ref >60)
Glucose, Bld: 91 mg/dL (ref 70–99)
Potassium: 3.8 mmol/L (ref 3.5–5.1)
Sodium: 142 mmol/L (ref 135–145)
Total Bilirubin: 0.6 mg/dL (ref 0.0–1.2)
Total Protein: 7.3 g/dL (ref 6.5–8.1)

## 2023-07-28 LAB — CBC WITH DIFFERENTIAL (CANCER CENTER ONLY)
Abs Immature Granulocytes: 0.01 10*3/uL (ref 0.00–0.07)
Basophils Absolute: 0 10*3/uL (ref 0.0–0.1)
Basophils Relative: 0 %
Eosinophils Absolute: 0.1 10*3/uL (ref 0.0–0.5)
Eosinophils Relative: 1 %
HCT: 38 % (ref 36.0–46.0)
Hemoglobin: 12.2 g/dL (ref 12.0–15.0)
Immature Granulocytes: 0 %
Lymphocytes Relative: 21 %
Lymphs Abs: 1.8 10*3/uL (ref 0.7–4.0)
MCH: 25.4 pg — ABNORMAL LOW (ref 26.0–34.0)
MCHC: 32.1 g/dL (ref 30.0–36.0)
MCV: 79 fL — ABNORMAL LOW (ref 80.0–100.0)
Monocytes Absolute: 0.7 10*3/uL (ref 0.1–1.0)
Monocytes Relative: 8 %
Neutro Abs: 5.9 10*3/uL (ref 1.7–7.7)
Neutrophils Relative %: 70 %
Platelet Count: 187 10*3/uL (ref 150–400)
RBC: 4.81 MIL/uL (ref 3.87–5.11)
RDW: 13.8 % (ref 11.5–15.5)
WBC Count: 8.5 10*3/uL (ref 4.0–10.5)
nRBC: 0 % (ref 0.0–0.2)

## 2023-07-28 LAB — IRON AND IRON BINDING CAPACITY (CC-WL,HP ONLY)
Iron: 88 ug/dL (ref 28–170)
Saturation Ratios: 21 % (ref 10.4–31.8)
TIBC: 413 ug/dL (ref 250–450)
UIBC: 325 ug/dL (ref 148–442)

## 2023-07-28 LAB — TSH: TSH: 5.24 u[IU]/mL — ABNORMAL HIGH (ref 0.350–4.500)

## 2023-07-28 LAB — FERRITIN: Ferritin: 27 ng/mL (ref 11–307)

## 2023-07-28 MED ORDER — RALOXIFENE HCL 60 MG PO TABS: 60.0000 mg | ORAL_TABLET | Freq: Every day | ORAL | 1 refills | Status: DC

## 2023-07-28 NOTE — Assessment & Plan Note (Signed)
 grade 2, ER+/PR+ -found on screening mammogram. S/p left lumpectomy on 05/01/21 by Dr. Cherlynn Cornfield showed 1 cm DCIS, margins negative.  -Postop course was complicated by a large hematoma requiring drainage. -s/p radiation 06/24/21 - 07/15/21 under Dr. Lurena Sally. -she started tamoxifen  10mg  in 07/2021 but has not tolerated well with bothersome hot flashes and palpitation. She has reduced to 5mg  3-4 time a week.  Due to hot flash and insomnia from tamoxifen , even with further dose reduction, she has stopped tamoxifen  in early 2024

## 2023-07-28 NOTE — Progress Notes (Signed)
 [facility]   [phone] [phone]   Clinic Follow up Note   Patient Care Team: Pete Brand, DO as PCP - General (Family Medicine) Auther Bo, RN as Oncology Nurse Navigator Alane Hsu, RN as Oncology Nurse Navigator Arch Ko, MD as Referring Physician (Hematology) Lockie Rima, MD as Consulting Physician (General Surgery) Colie Dawes, MD as Attending Physician (Radiation Oncology) Burton, Lacie K, NP as Nurse Practitioner (Nurse Practitioner) Sonja Angier, MD as Consulting Physician (Hematology)  Date of Service:  07/28/2023  CHIEF COMPLAINT: f/u of DCIS  CURRENT THERAPY:  Raloxifene  60 mg daily (she take a few times a week)  Oncology History   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 S/p left lumpectomy on 05/01/21 by Dr. Cherlynn Cornfield showed 1 cm DCIS, margins negative.  -Postop course was complicated by a large hematoma requiring drainage. -s/p radiation 06/24/21 - 07/15/21 under Dr. Lurena Sally. -she started tamoxifen  10mg  in 07/2021 but has not tolerated well with bothersome hot flashes and palpitation. She has reduced to 5mg  3-4 time a week.  Due to hot flash and insomnia from tamoxifen , even with further dose reduction, she has stopped tamoxifen  in early 2024  Assessment & Plan History of breast DCIS  - Previously trialed tamoxifen , could not tolerate well.  She is on raloxifene , but only takes a few times a week. - We discussed side effect management - Due to her high risk for future breast cancer, and dense breast tissue, I recommend contrast-enhanced mammogram once a year, next due in January 2026 - Will also obtain annual breast MRI with and without contrast, next due in July 2025   bleeding disorder of unknown etiology Bleeding disorder of unknown etiology, possibly a platelet disorder. She was seen by Dr. Leveda Rea at [facility]. Protocol established for surgical procedures, including  DDAVP, tranexamic acid, and transfusion if necessary. Advised to wear a medical alert bracelet indicating bleeding disorder and management protocol. - Ensure she has a medical alert bracelet indicating bleeding disorder and management protocol.   Menopausal symptoms Experiencing menopausal symptoms including night sweats and hot flashes. Periods ceased 4-5 months ago, indicating perimenopause. Discussed raloxifene  for bone health and potential switch to anastrozole post-menopause. Options for managing hot flashes include gabapentin for night sweats and sleep, Effexor, and a new menopause-specific medication. Prefers to avoid medications unless necessary due to sensitivity. - Continue raloxifene  with one refill, consider half dose if sensitive. - Consider gabapentin 100 mg at night for night sweats if needed.  Plan - She is clinically doing well, exam was unremarkable except scar tissues in the left breast. - She will continue to raloxifene  a few times a week as she can tolerate - I ordered a breast MRI with and without contrast for July 2025, and a contrast-enhanced mammogram at the [facility] in January 2026 - Lab and follow-up in 1 year    SUMMARY OF ONCOLOGIC HISTORY: Oncology History Overview Note   Cancer Staging  Ductal carcinoma in situ (DCIS) of left breast Staging form: Breast, AJCC 8th Edition - Clinical stage from 04/02/2021: Stage 0 (cTis (DCIS), cN0, cM0, G3, ER+, PR+, HER2: Not Assessed) - Signed by Sonja Bladenboro, MD on 04/09/2021 - Pathologic stage from 05/01/2021: Stage Unknown (pTis (DCIS), pNX, cM0, G2, ER+, PR+, HER2: Not Assessed) - Signed by Sonja Tilden, MD on 05/23/2021    Ductal carcinoma in situ (DCIS) of left breast  01/18/2021 Mammogram   Exam: 3D Mammogram Diagnostic Digital -  L  IMPRESSION: The 0.3 cm grouped round calcifications in the left breast are suspicious.   04/02/2021 Initial Biopsy   Diagnosis 1. Breast, left, needle core biopsy, left breast calcification @ 3:00  6 cmfn  - DUCTAL CARCINOMA IN SITU WITH NECROSIS AND CALCIFICATIONS  - SEE COMMENT Microscopic Comment Immunohistochemistry was performed on the biopsy to assess for an invasive component (SMM, calponin, CD34 and p63). Myoepithelial cells are present supporting the diagnosis of ductal carcinoma in situ. Based on the biopsy, the ductal carcinoma in situ cribriform and solid patterns, intermediate nuclear grade, and measures 0.6 cm in greatest linear extent.  1. PROGNOSTIC INDICATORS Results: Estrogen Receptor: 100%, POSITIVE, STRONG STAINING INTENSITY Progesterone Receptor: 70%, POSITIVE, STRONG STAINING INTENSITY   04/02/2021 Cancer Staging   Staging form: Breast, AJCC 8th Edition - Clinical stage from 04/02/2021: Stage 0 (cTis (DCIS), cN0, cM0, G3, ER+, PR+, HER2: Not Assessed) - Signed by Sonja Geyser, MD on 04/09/2021 Stage prefix: Initial diagnosis Histologic grading system: 3 grade system   04/08/2021 Initial Diagnosis   Ductal carcinoma in situ (DCIS) of left breast    Genetic Testing   [facility] CancerNext-Expanded Panel was Negative. Report date is 04/20/2021.  The CancerNext-Expanded gene panel offered by [facility] and includes sequencing, rearrangement, and RNA analysis for the following 77 genes: AIP, ALK, APC, ATM, AXIN2, BAP1, BARD1, BLM, BMPR1A, BRCA1, BRCA2, BRIP1, CDC73, CDH1, CDK4, CDKN1B, CDKN2A, CHEK2, CTNNA1, DICER1, FANCC, FH, FLCN, GALNT12, KIF1B, LZTR1, MAX, MEN1, MET, MLH1, MSH2, MSH3, MSH6, MUTYH, NBN, NF1, NF2, NTHL1, PALB2, PHOX2B, PMS2, POT1, PRKAR1A, PTCH1, PTEN, RAD51C, RAD51D, RB1, RECQL, RET, SDHA, SDHAF2, SDHB, SDHC, SDHD, SMAD4, SMARCA4, SMARCB1, SMARCE1, STK11, SUFU, TMEM127, TP53, TSC1, TSC2, VHL and XRCC2 (sequencing and deletion/duplication); EGFR, EGLN1, HOXB13, KIT, MITF, PDGFRA, POLD1, and POLE (sequencing only); EPCAM and GREM1 (deletion/duplication only).    04/26/2021 Imaging   EXAM: BILATERAL BREAST MRI WITH AND WITHOUT CONTRAST  IMPRESSION: 1. No  significant enhancement at the site of known ductal carcinoma in situ in the LATERAL portion of the LEFT breast. There is minimal enhancement along the biopsy tract in this region. 2. 0.8 centimeter indeterminate enhancing mass in the posterior LOWER INNER QUADRANT of the LEFT breast. 3. RIGHT breast is negative.   05/01/2021 Cancer Staging   Staging form: Breast, AJCC 8th Edition - Pathologic stage from 05/01/2021: Stage Unknown (pTis (DCIS), pNX, cM0, G2, ER+, PR+, HER2: Not Assessed) - Signed by Sonja Albion, MD on 05/23/2021 Stage prefix: Initial diagnosis Histologic grading system: 3 grade system Residual tumor (R): R0 - None   05/01/2021 Pathology Results   FINAL MICROSCOPIC DIAGNOSIS:   A. BREAST, LEFT MEDIAL @ 7:30, EXCISION:  - Fibrocystic changes with calcifications.  - No malignancy identified.   B. BREAST, LEFT @ 3;00, LUMPECTOMY:  - Ductal carcinoma in situ, 1 cm.  - All surgical margins negative for DCIS.  - Fibrocystic changes with diffuse sclerosing adenosis and  calcifications.  - Biopsy site and biopsy clip.  - See oncology table.    06/24/2021 - 07/15/2021 Radiation Therapy   Completed 42.56 Martina Sledge in 16 fractions to the left breast by Dr. Lurena Sally   07/2021 -  Anti-estrogen oral therapy   Began adjuvant tamoxifen  10 mg p.o. once daily, goal 3-5 years   11/25/2021 Survivorship   SCP visit with Lacie Burton, NP      Discussed the use of AI scribe software for clinical note transcription with the patient, who gave verbal consent to proceed.  History of Present Illness  Beth Stephens is a 48 year old female who presents for follow-up of breast disease.  She experiences difficulty tolerating raloxifene , taking it approximately once every week or two, and has been on and off this medication for two years. She is transitioning into menopause with night sweats, hot flashes, and amenorrhea for the past four to five months. Sleep disturbances are primarily due to hot flashes,  and she is considering medication options for relief, preferring to start with low doses due to medication sensitivity.  She has dense breast tissue, categorized as category C, and has previously undergone a mammogram and MRI, with the most recent MRI in 2023. She is considering future imaging options.  She has a history of a hematoma from previous surgery, which remains tender during activities like yoga and stretching. She experienced significant bleeding during surgery and was informed of a bleeding disorder of unknown origin.     All other systems were reviewed with the patient and are negative.  MEDICAL HISTORY:  Past Medical History:  Diagnosis Date   Cancer (HCC)    Breast Cancer   Colitis    Colitis     SURGICAL HISTORY: Past Surgical History:  Procedure Laterality Date   BREAST LUMPECTOMY WITH RADIOACTIVE SEED LOCALIZATION Left 05/01/2021   Procedure: LEFT BREAST LUMPECTOMY WITH RADIOACTIVE SEED LOCALIZATION;  Surgeon: Lockie Rima, MD;  Location: [facility] OR;  Service: General;  Laterality: Left;   COLONOSCOPY     WISDOM TOOTH EXTRACTION      I have reviewed the social history and family history with the patient and they are unchanged from previous note.  ALLERGIES:  is allergic to gluten meal and sumatriptan.  MEDICATIONS:  Current Outpatient Medications  Medication Sig Dispense Refill   ergocalciferol  (VITAMIN D2) 1.25 MG (50000 UT) capsule Take 1 capsule (50,000 Units total) by mouth once a week. 12 capsule 0   raloxifene  (EVISTA ) 60 MG tablet Take 1 tablet (60 mg total) by mouth daily. 30 tablet 1   sulfaSALAzine (AZULFIDINE) 500 MG tablet Take 1,000 mg by mouth See admin instructions. Take 2 tablets (1000 mg) by mouth daily after lunch, may take 2 tablets (1000 mg) two more times in the day as needed for colitis flare     No current facility-administered medications for this visit.    PHYSICAL EXAMINATION: ECOG PERFORMANCE STATUS: 0 - Asymptomatic  Vitals:    07/28/23 0834  BP: 109/71  Pulse: 79  Resp: (!) 21  Temp: (!) 97.4 F (36.3 C)  SpO2: 100%   Wt Readings from Last 3 Encounters:  07/28/23 122 lb 6.4 oz (55.5 kg)  08/08/22 120 lb 12.8 oz (54.8 kg)  01/27/22 121 lb 9.6 oz (55.2 kg)     GENERAL:alert, no distress and comfortable SKIN: skin color, texture, turgor are normal, no rashes or significant lesions EYES: normal, Conjunctiva are pink and non-injected, sclera clear NECK: supple, thyroid  normal size, non-tender, without nodularity LYMPH:  no palpable lymphadenopathy in the cervical, axillary  LUNGS: clear to auscultation and percussion with normal breathing effort HEART: regular rate & rhythm and no murmurs and no lower extremity edema ABDOMEN:abdomen soft, non-tender and normal bowel sounds Musculoskeletal:no cyanosis of digits and no clubbing  NEURO: alert & oriented x 3 with fluent speech, no focal motor/sensory deficits BREAST: Left breast smaller post-lumpectomy surgery. Scar tissue on lateral breast. Mild tenderness on lateral breast.  No other palpable breast mass or adenopathy. Physical Exam   LABORATORY DATA:  I have reviewed the data as listed  Latest Ref Rng & Units 07/28/2023    8:06 AM 08/08/2022   10:10 AM 01/27/2022   10:17 AM  CBC  WBC 4.0 - 10.5 K/uL 8.5  7.8  7.6   Hemoglobin 12.0 - 15.0 g/dL 95.2  84.1  32.4   Hematocrit 36.0 - 46.0 % 38.0  37.3  38.6   Platelets 150 - 400 K/uL 187  186  174         Latest Ref Rng & Units 07/28/2023    8:06 AM 08/08/2022   10:10 AM 01/27/2022   10:17 AM  CMP  Glucose 70 - 99 mg/dL 91  401  83   BUN 6 - 20 mg/dL 11  11  12    Creatinine 0.44 - 1.00 mg/dL 0.27  2.53  6.64   Sodium 135 - 145 mmol/L 142  138  143   Potassium 3.5 - 5.1 mmol/L 3.8  4.0  3.7   Chloride 98 - 111 mmol/L 108  107  108   CO2 22 - 32 mmol/L 27  25  30    Calcium 8.9 - 10.3 mg/dL 9.1  8.6  9.4   Total Protein 6.5 - 8.1 g/dL 7.3  6.9  7.6   Total Bilirubin 0.0 - 1.2 mg/dL 0.6  0.5  0.5    Alkaline Phos 38 - 126 U/L 63  56  57   AST 15 - 41 U/L 17  16  19    ALT 0 - 44 U/L 15  13  14        RADIOGRAPHIC STUDIES: I have personally reviewed the radiological images as listed and agreed with the findings in the report. No results found.    Orders Placed This Encounter  Procedures   MR BREAST BILATERAL W WO CONTRAST INC CAD    Standing Status:   Future    Expected Date:   10/13/2023    Expiration Date:   07/27/2024    If indicated for the ordered procedure, I authorize the administration of contrast media per Radiology protocol:   Yes    What is the patient's sedation requirement?:   No Sedation    Does the patient have a pacemaker or implanted devices?:   No    Radiology Contrast Protocol - do NOT remove file path:   [email or web address]    Preferred imaging location?:   [facility] (table limit-550lbs)   Vitamin D  25 hydroxy    Standing Status:   Future    Expected Date:   07/28/2023    Expiration Date:   07/27/2024   All questions were answered. The patient knows to call the clinic with any problems, questions or concerns. No barriers to learning was detected. The total time spent in the appointment was 30 minutes.     Sonja Knierim, MD 07/28/2023

## 2023-07-29 ENCOUNTER — Encounter: Payer: Self-pay | Admitting: Hematology

## 2023-07-31 ENCOUNTER — Other Ambulatory Visit: Payer: Self-pay | Admitting: Hematology

## 2023-07-31 ENCOUNTER — Other Ambulatory Visit: Payer: Self-pay

## 2023-07-31 ENCOUNTER — Encounter: Payer: Self-pay | Admitting: Hematology

## 2023-07-31 DIAGNOSIS — E039 Hypothyroidism, unspecified: Secondary | ICD-10-CM

## 2023-07-31 LAB — T4, FREE: Free T4: 0.79 ng/dL (ref 0.61–1.12)

## 2023-12-26 ENCOUNTER — Encounter: Payer: Self-pay | Admitting: Hematology

## 2024-01-17 IMAGING — MR MR BREAST BILAT WO/W CM
13 of 23 series · 28 of 48 positions shown · IV contrast (5 ml gadavist)
Comparison: 04/10/2021 and earlier studies from [REDACTED]
COMPARISON: 04/10/2021 and earlier studies from [REDACTED]

Addendum:
CLINICAL DATA: Biopsy of LEFT breast calcifications in the 3
o'clock location performed 04/02/2021 shows ductal carcinoma in situ
with necrosis and calcifications.

EXAM:
BILATERAL BREAST MRI WITH AND WITHOUT CONTRAST
TECHNIQUE: Multiplanar, multisequence MR images of both breasts were obtained
prior to and following the intravenous administration of 5 ml of
Gadavist

[Series 14: STIR · axial · 3.0mm · 0.74mm/px · 1 of 60 slices shown (1 of 2)]
[im 1/60]
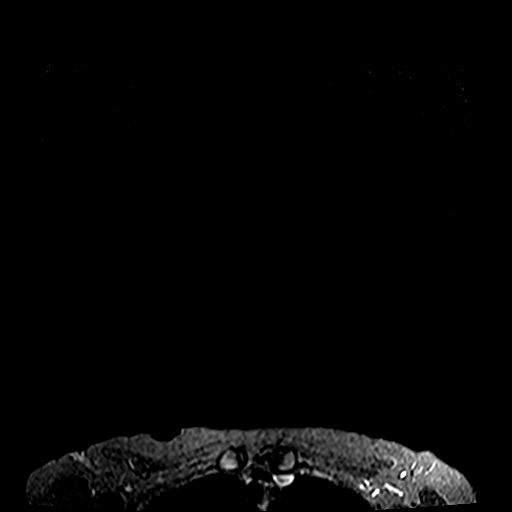

[Series 15: pre non fs · axial · non-contrast · 1.2mm · 0.99mm/px · z∈[-80,+91]mm · 2 of 144 slices shown (1 of 2)]
[im 1/144]
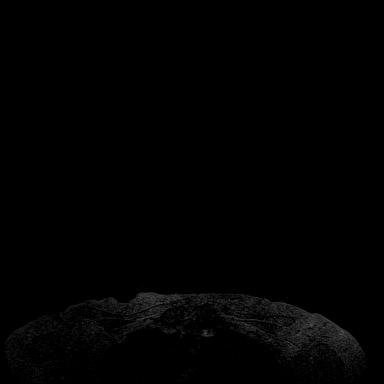
[im 144/144]
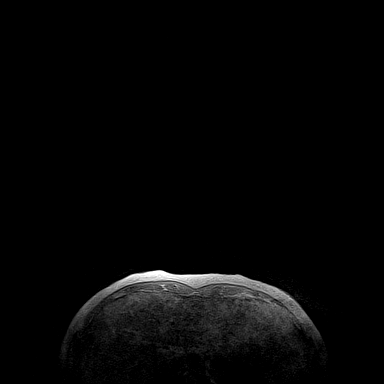

[Series 17: 20 sec post · axial · 1.2mm · 0.99mm/px · z∈[-80,+91]mm · 2 of 144 slices shown]
[im 1/144]
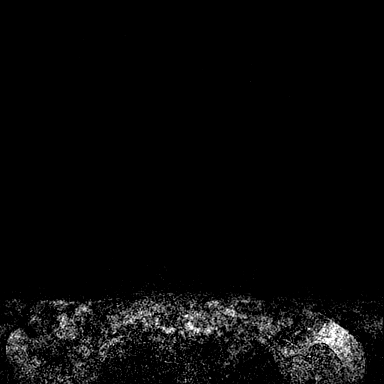
[im 144/144]
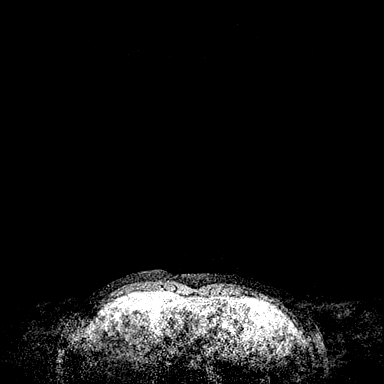

[Series 18: 20 sec sub · axial · 1.2mm · 0.99mm/px · z∈[-80,+91]mm · 2 of 144 slices shown]
[im 1/144]
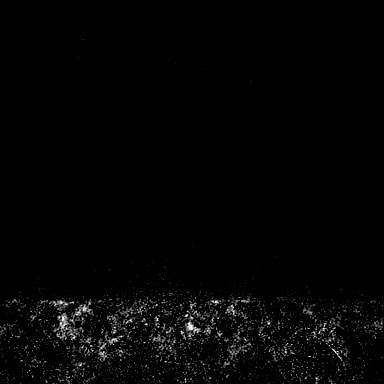
[im 144/144]
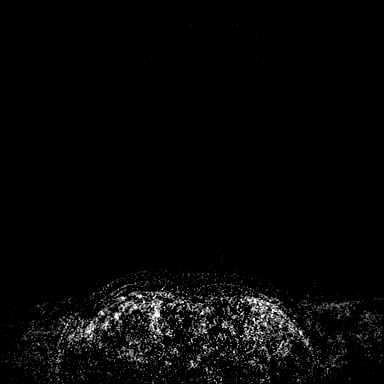

[Series 20: 3min post · axial · 1.2mm · 0.99mm/px · z∈[-80,+91]mm · 2 of 144 slices shown]
[im 1/144]
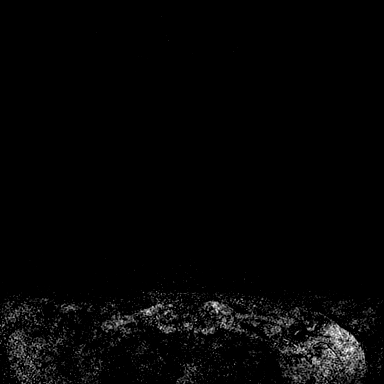
[im 144/144]
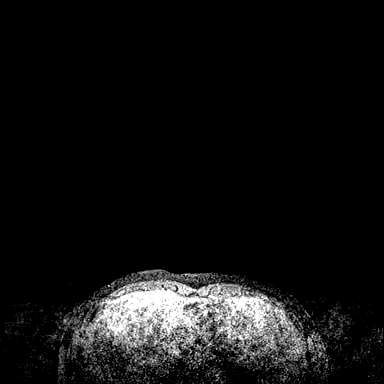

[Series 21: 3 min sub · axial · 1.2mm · 0.99mm/px · z∈[-80,+91]mm · 2 of 144 slices shown]
[im 1/144]
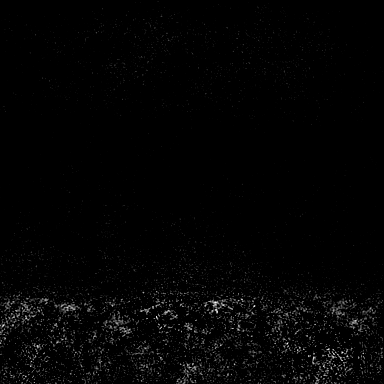
[im 144/144]
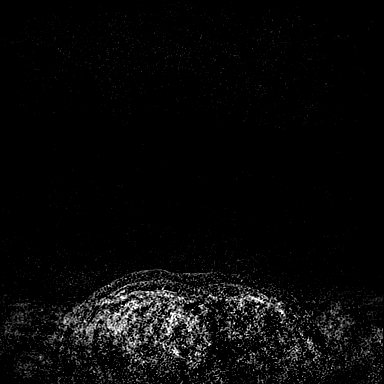

[Series 23: 5 min post · axial · 1.2mm · 0.99mm/px · z∈[-80,+91]mm · 2 of 144 slices shown]
[im 1/144]
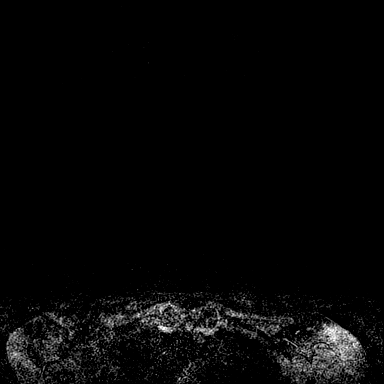
[im 144/144]
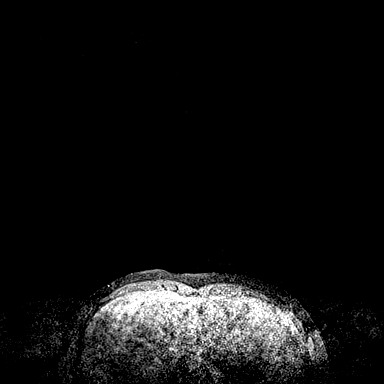

[Series 24: 5 min sub · axial · 1.2mm · 0.99mm/px · z∈[-80,+91]mm · 3 of 144 slices shown]
[im 1/144]
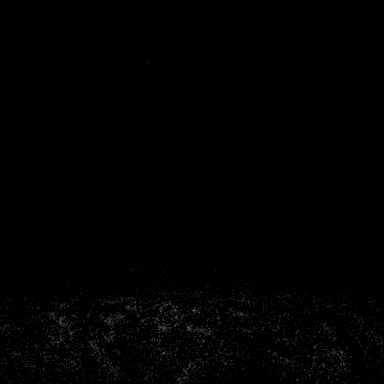
[im 72/144]
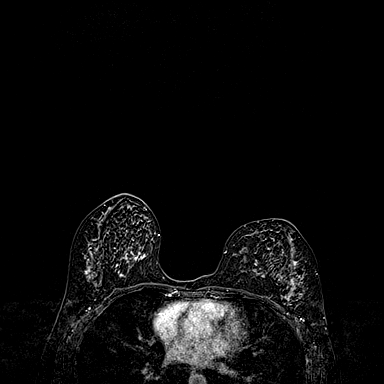
[im 144/144]
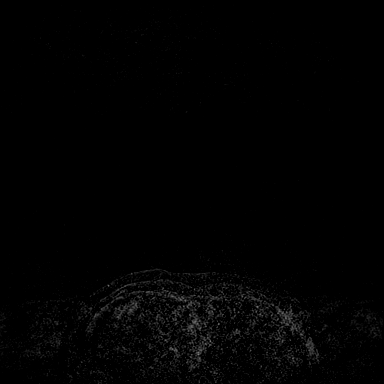

[Series 26: STIR · axial · right · 3.0mm · 0.74mm/px · 1 of 1 slices shown (2 of 2)]
[im 1/1]
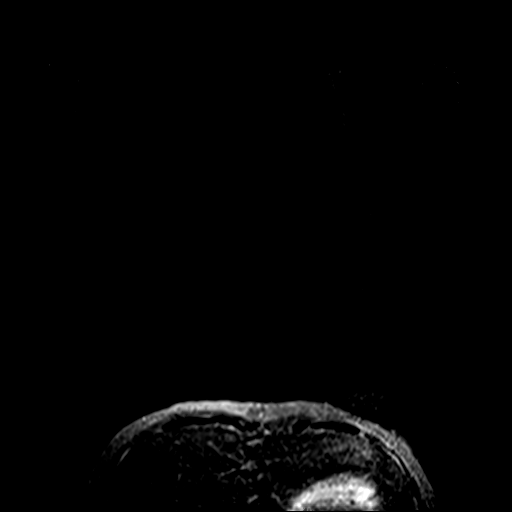

[Series 27: pre non fs · axial · non-contrast · right · 1.2mm · 0.99mm/px · z∈[-80,+91]mm · 3 of 144 slices shown (2 of 2)]
[im 1/144]
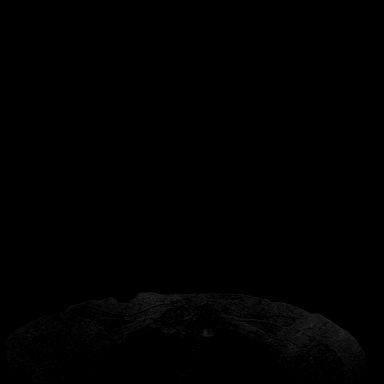
[im 72/144]
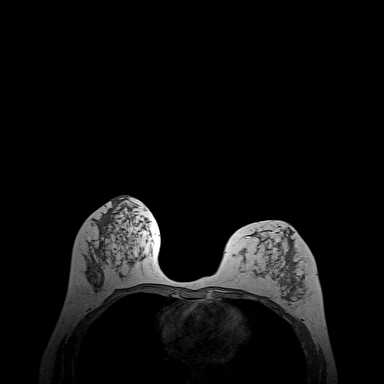
[im 144/144]
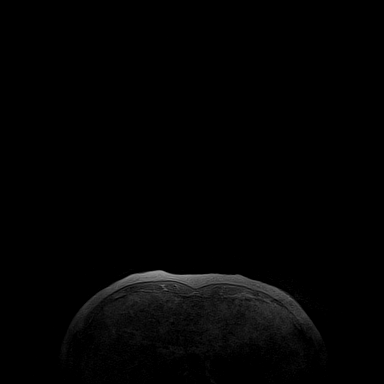

[Series 32: pre fs · axial · non-contrast · right · 1.2mm · 0.99mm/px · z∈[-80,+91]mm · 3 of 144 slices shown]
[im 1/144]
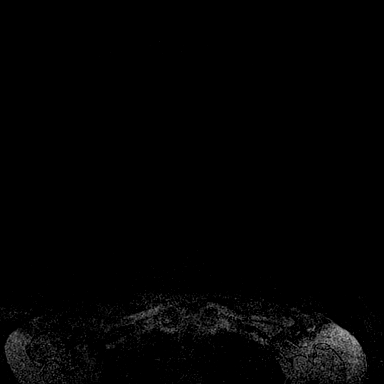
[im 72/144]
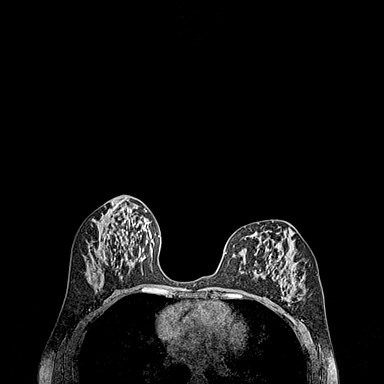
[im 144/144]
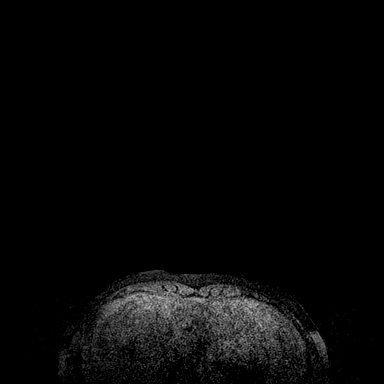

[Series 33: post fs 20 · axial · right · 1.2mm · 0.99mm/px · z∈[-80,+91]mm · 3 of 144 slices shown (1 of 2)]
[im 1/144]
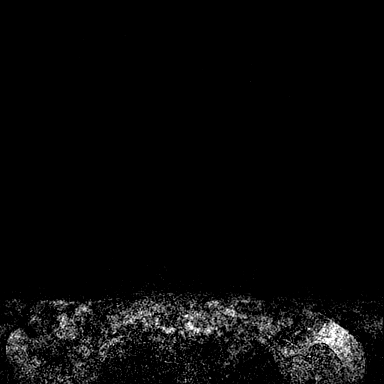
[im 72/144]
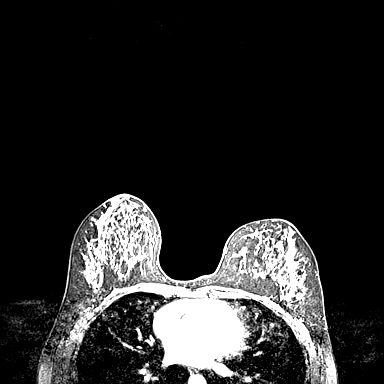
[im 144/144]
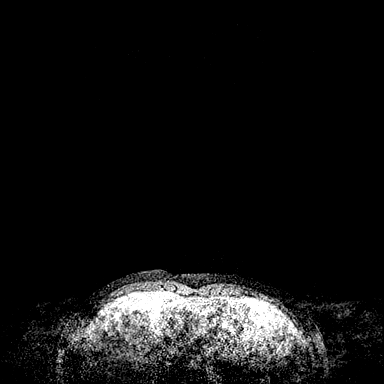

[Series 34: post fs 20 · axial · right · 1.2mm · 0.99mm/px · z∈[-80,+5]mm · 2 of 144 slices shown (2 of 2)]
[im 1/144]
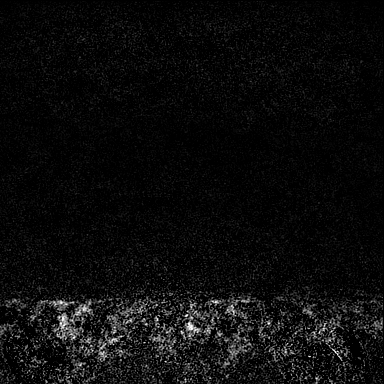
[im 72/144]
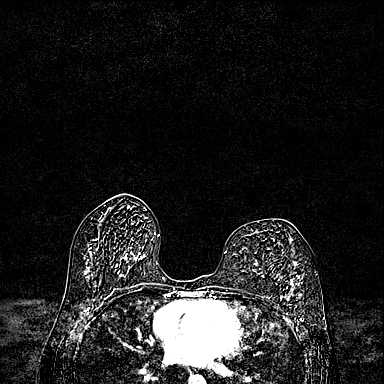

[28 of 48 positions shown; findings below may reference images not displayed]

Three-dimensional MR images were rendered by post-processing of the
original MR data on an independent workstation. The
three-dimensional MR images were interpreted, and findings are
reported in the following complete MRI report for this study. Three
dimensional images were evaluated at the independent interpreting
workstation using the DynaCAD thin client.
FINDINGS: Breast composition: d. Extreme fibroglandular tissue.

Background parenchymal enhancement: Moderate

Right breast: No mass or abnormal enhancement.

Left breast: Tissue marker clip is identified in the LATERAL portion
of the left breast at the site of recently biopsied ductal carcinoma
in situ. There is no significant enhancement at the tissue marker
clip. There is faint enhancing biopsy tract extending to the skin
from the biopsy clip.

In the posterior lower inner quadrant of the left breast, there is
an oval lobulated enhancing mass showing homogeneous persistent
kinetics. Mass measures 0.8 centimeters. (Image 56 of series 12).
Lesion is hyperintense on T2 images and hypointense on T1 weighted
images.

Lymph nodes: No abnormal appearing lymph nodes.

Ancillary findings:  None.
IMPRESSION: 1. No significant enhancement at the site of known ductal carcinoma
in situ in the LATERAL portion of the RIGHT breast. There is minimal
enhancement along the biopsy tract in this region.
2. 0.8 centimeter indeterminate enhancing mass in the posterior
LOWER INNER QUADRANT of the LEFT breast.
3. RIGHT breast is negative.

RECOMMENDATION:
1. Recommend targeted follow-up ultrasound for evaluation of mass in
the posterior LOWER INNER QUADRANT of the LEFT breast. If there is
no sonographic correlate for this finding, consider MR guided core
biopsy.
2. Recommend treatment plan for known ductal carcinoma in situ in
the LATERAL portion of the LEFT breast.

BI-RADS CATEGORY  4: Suspicious.

ADDENDUM:
I discussed the findings with Dr. Locklear at the time of
interpretation.

SEE CORRECTION FOR REPORT, BELOW:
SEE CORRECTION FOR REPORT, BELOW

1. No significant enhancement at the site of known ductal carcinoma
in situ in the LATERAL portion of the LEFT breast. There is minimal
enhancement along the biopsy tract in this region.
2. 0.8 centimeter indeterminate enhancing mass in the posterior
LOWER INNER QUADRANT of the LEFT breast.
3. RIGHT breast is negative.

*** End of Addendum ***
Three-dimensional MR images were rendered by post-processing of the
original MR data on an independent workstation. The
three-dimensional MR images were interpreted, and findings are
reported in the following complete MRI report for this study. Three
dimensional images were evaluated at the independent interpreting
workstation using the DynaCAD thin client.
FINDINGS: Breast composition: d. Extreme fibroglandular tissue.

Background parenchymal enhancement: Moderate

Right breast: No mass or abnormal enhancement.

Left breast: Tissue marker clip is identified in the LATERAL portion
of the left breast at the site of recently biopsied ductal carcinoma
in situ. There is no significant enhancement at the tissue marker
clip. There is faint enhancing biopsy tract extending to the skin
from the biopsy clip.

In the posterior lower inner quadrant of the left breast, there is
an oval lobulated enhancing mass showing homogeneous persistent
kinetics. Mass measures 0.8 centimeters. (Image 56 of series 12).
Lesion is hyperintense on T2 images and hypointense on T1 weighted
images.

Lymph nodes: No abnormal appearing lymph nodes.

Ancillary findings:  None.
IMPRESSION: 1. No significant enhancement at the site of known ductal carcinoma
in situ in the LATERAL portion of the RIGHT breast. There is minimal
enhancement along the biopsy tract in this region.
2. 0.8 centimeter indeterminate enhancing mass in the posterior
LOWER INNER QUADRANT of the LEFT breast.
3. RIGHT breast is negative.

RECOMMENDATION:
1. Recommend targeted follow-up ultrasound for evaluation of mass in
the posterior LOWER INNER QUADRANT of the LEFT breast. If there is
no sonographic correlate for this finding, consider MR guided core
biopsy.
2. Recommend treatment plan for known ductal carcinoma in situ in
the LATERAL portion of the LEFT breast.

BI-RADS CATEGORY  4: Suspicious.

## 2024-01-27 ENCOUNTER — Encounter: Payer: Self-pay | Admitting: Hematology

## 2024-01-29 ENCOUNTER — Ambulatory Visit
Admission: RE | Admit: 2024-01-29 | Discharge: 2024-01-29 | Disposition: A | Discharge status: Home / Self Care | Source: Ambulatory Visit | Attending: Hematology | Admitting: Hematology

## 2024-01-29 DIAGNOSIS — D0512 Intraductal carcinoma in situ of left breast: Secondary | ICD-10-CM

## 2024-01-29 MED ORDER — GADOPICLENOL 0.5 MMOL/ML IV SOLN
6.0000 mL | Freq: Once | INTRAVENOUS | Status: AC | PRN
Start: 2024-01-29 — End: 2024-01-29
  Administered 2024-01-29: 6 mL via INTRAVENOUS

## 2024-02-07 ENCOUNTER — Encounter: Payer: Self-pay | Admitting: Hematology

## 2024-05-06 ENCOUNTER — Ambulatory Visit: Admitting: Family Medicine

## 2024-07-22 ENCOUNTER — Ambulatory Visit: Admitting: Family Medicine

## 2024-08-05 ENCOUNTER — Other Ambulatory Visit

## 2024-08-05 ENCOUNTER — Ambulatory Visit: Admitting: Nurse Practitioner
